# Patient Record
Sex: Male | Born: 1950 | Race: White | Hispanic: No | Marital: Married | Smoking: Never smoker
Health system: Southern US, Community
[De-identification: ages and names within clinical notes are randomized; demographics above are authoritative.]

## PROBLEM LIST (undated history)

## (undated) DIAGNOSIS — I10 Essential (primary) hypertension: Secondary | ICD-10-CM

## (undated) DIAGNOSIS — R7303 Prediabetes: Secondary | ICD-10-CM

## (undated) DIAGNOSIS — H5440 Blindness, one eye, unspecified eye: Secondary | ICD-10-CM

## (undated) DIAGNOSIS — G4733 Obstructive sleep apnea (adult) (pediatric): Secondary | ICD-10-CM

## (undated) DIAGNOSIS — K219 Gastro-esophageal reflux disease without esophagitis: Secondary | ICD-10-CM

## (undated) DIAGNOSIS — I639 Cerebral infarction, unspecified: Secondary | ICD-10-CM

## (undated) DIAGNOSIS — H9190 Unspecified hearing loss, unspecified ear: Secondary | ICD-10-CM

## (undated) DIAGNOSIS — E119 Type 2 diabetes mellitus without complications: Secondary | ICD-10-CM

## (undated) DIAGNOSIS — Z9989 Dependence on other enabling machines and devices: Secondary | ICD-10-CM

## (undated) DIAGNOSIS — F418 Other specified anxiety disorders: Secondary | ICD-10-CM

## (undated) DIAGNOSIS — F419 Anxiety disorder, unspecified: Secondary | ICD-10-CM

## (undated) HISTORY — PX: RECTAL SURGERY: SHX760

## (undated) HISTORY — DX: Prediabetes: R73.03

## (undated) HISTORY — DX: Obstructive sleep apnea (adult) (pediatric): G47.33

## (undated) HISTORY — DX: Gastro-esophageal reflux disease without esophagitis: K21.9

## (undated) HISTORY — DX: Blindness, one eye, unspecified eye: H54.40

## (undated) HISTORY — DX: Dependence on other enabling machines and devices: Z99.89

## (undated) HISTORY — DX: Cerebral infarction, unspecified: I63.9

## (undated) HISTORY — PX: TONSILLECTOMY: SUR1361

## (undated) HISTORY — DX: Other specified anxiety disorders: F41.8

## (undated) HISTORY — PX: UMBILICAL HERNIA REPAIR: SHX196

## (undated) HISTORY — DX: Essential (primary) hypertension: I10

## (undated) HISTORY — DX: Type 2 diabetes mellitus without complications: E11.9

---

## 2003-09-10 ENCOUNTER — Encounter: Admission: RE | Admit: 2003-09-10 | Discharge: 2003-09-10 | Payer: Self-pay | Admitting: Neurosurgery

## 2004-06-14 ENCOUNTER — Ambulatory Visit (HOSPITAL_COMMUNITY): Admission: RE | Admit: 2004-06-14 | Discharge: 2004-06-14 | Payer: Self-pay | Admitting: Neurology

## 2004-07-12 ENCOUNTER — Ambulatory Visit (HOSPITAL_COMMUNITY): Admission: RE | Admit: 2004-07-12 | Discharge: 2004-07-12 | Payer: Self-pay | Admitting: Internal Medicine

## 2004-07-29 ENCOUNTER — Observation Stay (HOSPITAL_COMMUNITY): Admission: EM | Admit: 2004-07-29 | Discharge: 2004-07-30 | Payer: Self-pay | Admitting: Emergency Medicine

## 2004-08-12 ENCOUNTER — Ambulatory Visit (HOSPITAL_COMMUNITY): Admission: RE | Admit: 2004-08-12 | Discharge: 2004-08-12 | Payer: Self-pay | Admitting: Internal Medicine

## 2006-09-21 ENCOUNTER — Encounter (INDEPENDENT_AMBULATORY_CARE_PROVIDER_SITE_OTHER): Payer: Self-pay | Admitting: *Deleted

## 2006-09-21 ENCOUNTER — Ambulatory Visit (HOSPITAL_COMMUNITY): Admission: RE | Admit: 2006-09-21 | Discharge: 2006-09-21 | Payer: Self-pay | Admitting: Urology

## 2008-01-01 ENCOUNTER — Ambulatory Visit: Payer: Self-pay | Admitting: Cardiology

## 2008-01-20 ENCOUNTER — Ambulatory Visit: Payer: Self-pay | Admitting: Cardiology

## 2008-01-23 ENCOUNTER — Inpatient Hospital Stay (HOSPITAL_COMMUNITY): Admission: AD | Admit: 2008-01-23 | Discharge: 2008-01-25 | Payer: Self-pay | Admitting: Cardiology

## 2008-01-23 ENCOUNTER — Ambulatory Visit: Payer: Self-pay | Admitting: Cardiology

## 2008-02-18 ENCOUNTER — Ambulatory Visit: Payer: Self-pay | Admitting: Cardiology

## 2008-07-24 ENCOUNTER — Ambulatory Visit: Payer: Self-pay | Admitting: Cardiology

## 2010-03-15 DIAGNOSIS — E119 Type 2 diabetes mellitus without complications: Secondary | ICD-10-CM | POA: Insufficient documentation

## 2010-03-15 DIAGNOSIS — K219 Gastro-esophageal reflux disease without esophagitis: Secondary | ICD-10-CM | POA: Insufficient documentation

## 2010-03-15 DIAGNOSIS — I10 Essential (primary) hypertension: Secondary | ICD-10-CM | POA: Insufficient documentation

## 2010-03-23 ENCOUNTER — Ambulatory Visit: Payer: Self-pay | Admitting: Cardiology

## 2010-03-23 DIAGNOSIS — R072 Precordial pain: Secondary | ICD-10-CM | POA: Insufficient documentation

## 2010-03-31 ENCOUNTER — Encounter: Payer: Self-pay | Admitting: Cardiology

## 2010-04-01 ENCOUNTER — Ambulatory Visit: Payer: Self-pay | Admitting: Cardiology

## 2010-04-01 ENCOUNTER — Encounter: Payer: Self-pay | Admitting: Cardiology

## 2010-04-05 ENCOUNTER — Encounter (INDEPENDENT_AMBULATORY_CARE_PROVIDER_SITE_OTHER): Payer: Self-pay | Admitting: *Deleted

## 2010-04-05 ENCOUNTER — Telehealth: Payer: Self-pay | Admitting: Cardiology

## 2010-04-06 ENCOUNTER — Telehealth (INDEPENDENT_AMBULATORY_CARE_PROVIDER_SITE_OTHER): Payer: Self-pay | Admitting: *Deleted

## 2010-11-22 NOTE — Progress Notes (Signed)
Summary: test results   Phone Note Call from Patient   Caller: Patient Reason for Call: Talk to Nurse, Lab or Test Results Summary of Call: please mail pt a letter when you get the results and he can call then. pt does not have a phome right now.  Initial call taken by: Edman Circle,  April 05, 2010 10:43 AM  Follow-up for Phone Call        Reviewed study with comments forwarded to RN. Follow-up by: Rollene Rotunda, MD, [facility],  April 05, 2010 1:28 PM

## 2010-11-22 NOTE — Medication Information (Signed)
Summary: Physician Orders  Physician Orders   Imported By: Roderic Ovens 04/04/2010 12:32:35  _____________________________________________________________________  External Attachment:    Type:   Image     Comment:   External Document

## 2010-11-22 NOTE — Progress Notes (Signed)
   Faxed all Cardiac over to [facility] practice of [place] @ fax [phone] cb Erle Crocker  April 06, 2010 9:40 AM

## 2010-11-22 NOTE — Letter (Signed)
Summary: Engineer, materials at [facility]  [address]   [place], [place] [address]   Phone: [phone]  Fax: [phone]        April 05, 2010 MRN: [id number]    [name] [name] [address] [place], [place]  [address]    Dear Mr. Keagle,  Your test ordered by Selena Batten has been reviewed by your physician (or physician assistant) and was found to be normal or stable. Your physician (or physician assistant) felt no changes were needed at this time.  ____ Echocardiogram  __X__ Cardiac Stress Test-Per Dr. Antoine Poche, no futher cardiac work up needed.  ____ Lab Work  ____ Peripheral vascular study of arms, legs or neck  ____ CT scan or X-ray  ____ Lung or Breathing test  ____ Other:   Thank you.   Cyril Loosen, RN, BSN    Duane Boston, M.D., F.A.C.C. Thressa Sheller, M.D., F.A.C.C. Oneal Grout, M.D., F.A.C.C. Cheree Ditto, M.D., F.A.C.C. Daiva Nakayama, M.D., F.A.C.C. Kenney Houseman, M.D., F.A.C.C. Jeanne Ivan, PA-C

## 2010-11-22 NOTE — Assessment & Plan Note (Signed)
Summary: [name] [name]  Medications Added SERTRALINE HCL 100 MG TABS (SERTRALINE HCL) 1 by mouth daily SIMVASTATIN 40 MG TABS (SIMVASTATIN) 1 by mouth dialy REMERON 15 MG TABS (MIRTAZAPINE) 1 by mouth daily      Allergies Added: NKDA   Visit Type:  Follow-up Primary Provider:  Dr. Wyvonnia Lora  CC:  chest pain.  History of Present Illness: The patient presents for evaluation of chest pain. He reports that this has been happening for about a month but in retrospect says it's really the same pain he's been having for a couple of years it just comes and goes. He did see Dr. Andee Lineman in the past and had a stress perfusion study with 2 slight perfusion defects. However, a followup catheterization demonstrated no coronary artery disease though he had sluggish flow down the LAD.  He now presents with recurrent chest discomfort similar to previous. He is substernal. It is a "hurting pain". It comes on with exertion lasting a couple of minutes after he stops what he is doing. He may also get discomfort with deep breathing at rest. He does get nauseated and sweats but this can happen without discomfort as well. He is not describe PND or orthopnea. He is not describing palpitations, presyncope or syncope. Is not describing PND or orthopnea. He does say that he gets an electrical feeling in his hands when he gets this pain.  Current Medications (verified): 1)  Sertraline Hcl 100 Mg Tabs (Sertraline Hcl) .Marland Kitchen.. 1 By Mouth Daily 2)  Simvastatin 40 Mg Tabs (Simvastatin) .Marland Kitchen.. 1 By Mouth Dialy 3)  Remeron 15 Mg Tabs (Mirtazapine) .Marland Kitchen.. 1 By Mouth Daily  Allergies (verified): No Known Drug Allergies  Past History:  Past Medical History:  1. Hypertension.  2. Borderline diabetes mellitus.  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  4. GERD  5. Sleep apnea (CPAP)  6. Depression/anxiety  Past Surgical History: Umbilical hernia repair. Tonsillectomy. Rectal surgery as an infant    Review of Systems       As stated in the HPI and negative for all other systems.   Vital Signs:  Patient profile:   60 year old male Height:      64 inches Weight:      221 pounds BMI:     38.07 Pulse rate:   65 / minute Resp:     16 per minute BP sitting:   150 / 96  (right arm)  Vitals Entered By: Marrion Coy, CNA (March 23, 2010 11:42 AM)  Physical Exam  General:  Well developed, well nourished, in no acute distress. Head:  normocephalic and atraumatic Eyes:  left eye and isocoric Mouth:  Dentures, otherwise unremarkable. Neck:  Neck supple, no JVD. No masses, thyromegaly or abnormal cervical nodes. Chest Wall:  no deformities or breast masses noted Lungs:  Clear bilaterally to auscultation and percussion. Abdomen:  Bowel sounds positive; abdomen soft and non-tender without masses, organomegaly, or hernias noted. No hepatosplenomegaly, obese Msk:  Back normal, normal gait. Muscle strength and tone normal. Extremities:  No clubbing or cyanosis. Neurologic:  Alert and oriented x 3. Skin:  Intact without lesions or rashes. Cervical Nodes:  no significant adenopathy Inguinal Nodes:  no significant adenopathy Psych:  Normal affect.   Detailed Cardiovascular Exam  Neck    Carotids: Carotids full and equal bilaterally without bruits.      Neck Veins: Normal, no JVD.    Heart    Inspection: no deformities or lifts  noted.      Palpation: normal PMI with no thrills palpable.      Auscultation: regular rate and rhythm, S1, S2 without murmurs, rubs, gallops, or clicks.    Vascular    Abdominal Aorta: no palpable masses, pulsations, or audible bruits.      Femoral Pulses: normal femoral pulses bilaterally.      Pedal Pulses: normal pedal pulses bilaterally.      Radial Pulses: normal radial pulses bilaterally.      Peripheral Circulation: no clubbing, cyanosis, or edema noted with normal capillary refill.     EKG  Procedure date:  03/23/2010  Findings:      Sinus  rhythm, rate 66, axis within normal limits, intervals within normal limits, no acute ST-T wave changes.  Impression & Recommendations:  Problem # 1:  PRECORDIAL PAIN (ICD-786.51)  His chest discomfort has some typical and some atypical features. I would like to do a treadmill stress test but he doesn't think he could ambulate. Therefore, he will have an adenosine Cardiolite. We will do this at [facility] so that we can compare it to previous. He did have small defects on the previous study. We will be looking for obvious new ischemic defects.  Orders: EKG w/ Interpretation (93000)  Problem # 2:  HYPERTENSION (ICD-401.9)  Blood pressure is slightly elevated here. However, he says he has a good blood pressure, and he takes at home it runs in the 130/70 range. Therefore, I will make no change to his medications at this time.  Orders: EKG w/ Interpretation (93000)  Problem # 3:  DIABETES MELLITUS, BORDERLINE (ICD-790.29) He reports that this is borderline.  I have no records from his primary care but will defer to his management.  Patient Instructions: 1)  Your physician recommends that you schedule a follow-up appointment as needed 2)  Your physician recommends that you continue on your current medications as directed. Please refer to the Current Medication list given to you today. 3)  Your physician has requested that you have an adenosine myoview.  For further information please visit [email or web address]  Please follow instruction sheet, as given.

## 2010-11-22 NOTE — Miscellaneous (Signed)
Summary: Orders Update  Clinical Lists Changes  Orders: Added new Referral order of Nuclear Med (Nuc Med) - Signed 

## 2011-03-07 NOTE — Cardiovascular Report (Signed)
NAME:  CAMILLE, THAU               ACCOUNT NO.:  [id number]   MEDICAL RECORD NO.:  [id number]          PATIENT TYPE:  INP   LOCATION:  3703                         FACILITY:  [facility]   PHYSICIAN:  Everardo Beals. Juanda Chance, MD, FACCDATE OF BIRTH:  09-21-1951   DATE OF PROCEDURE:  01/24/2008  DATE OF DISCHARGE:                            CARDIAC CATHETERIZATION   CLINICAL HISTORY:  Mr. Youngblood is 60 years old and was recently admitted  to [facility] with syncope.  He was discharged and had an  outpatient Myoview scan which suggested myocardial ischemia.  He was  brought back into the hospital with recurrent chest pain and transferred  for evaluation with angiography.   PROCEDURE:  Right heart catheterization was performed percutaneously via  the right femoral vein using a minisheath and Swan-Ganz thermodilution  catheter.  Left heart catheterization was performed percutaneously via  the right femoral artery using an arterial sheath and 5-French preformed  coronary catheters.  A front wall arterial puncture was performed, and  Omnipaque contrast was used.  The patient tolerated the procedure well  and left the laboratory in satisfactory condition.   RESULTS:  Left main coronary artery.  The left main coronary artery was  free of significant disease.   Left anterior descending artery.  The left anterior descending artery  gave rise to three diagonal branches and three septal perforators.  These vessels were free of significant disease, but the flow was slow in  the vessel, requiring 45 cycles for complete filling (TIMI II flow).   The circumflex artery.  The circumflex artery is a small vessel that  supplied only a posterolateral branch.  This vessel is free of  significant disease.   The right coronary artery.  The right coronary artery was a moderately  large vessel that gave rise to conus branch, and right ventricular  branch, a posterior descending branch, and two posterolateral  branches.  This vessel also had slow flow with four cycles to feel the completes  vessel (TIMI II flow).   The left ventriculogram.  The left ventriculogram performed in the RAO  projection showed good wall motion, with no areas of hypokinesis.  The  estimated fraction was 55%.  There was no mitral regurgitation.   HEMODYNAMIC DATA:  The right ventricular pressure was 14 mean.  The  pulmonary artery pressure was 45/30, with mean of 36.  Pulmonary wedge  pressure was 20 mean.  Left ventricular pressure was 106/26.  The LA  pressure 106/74, with mean of 91.  Cardiac output/cardiac index was  5/2.5 liters/minutes/sq m by Fick.  The pulmonary artery saturation was  67%, and the aortic saturation was 92%.   CONCLUSION:  1. Normal coronary angiography, but with TIMI II flow in the left      anterior descending and right coronary arteries, and normal left      ventricular systolic function, with an estimated ejection fraction      of 55%.  2. Elevated pulmonary wedge and pulmonary artery pressure, with      elevated pulmonary vascular resistance.   RECOMMENDATIONS:  The patient appears to  have congestive heart,  primarily related to diastolic dysfunction, with also associated  elevation in her pulmonary vascular resistance.  We will plan medical  therapy with gentle diuresis and target discharge tomorrow.      Bruce Elvera Lennox Juanda Chance, MD, [facility]  Electronically Signed     BRB/MEDQ  D:  01/24/2008  T:  01/24/2008  Job:  [id number]   cc:   Ellyn Hack, MD,FACC  Cardiopulmonary Lab

## 2011-03-07 NOTE — Assessment & Plan Note (Signed)
[facility]                          [facility] NOTE   NAME:[name] [name] SCHIFF                      MRN:          [id number]  DATE:01/20/2008                            DOB:          November 28, 1950    REFERRING PHYSICIAN:  Wyvonnia [name]   HISTORY OF PRESENT ILLNESS:  Patient 60 year old male with history of  syncope.  The patient was admitted to [facility] on December 31, 2007.  He  was admitted to the service of Dr. Doyne Keel.  The patient reportedly was  at his doctor's office when he suddenly felt very lightheaded and dizzy.  Sat down in a chair, but he thinks he blacked out for a period of  seconds to 1 minute.  When he woke up his fingers and feet were tingling  but had no other symptoms.  Was admitted by Dr. Doyne Keel and ruled out  for myocardial infarction.  I do not have orthostatic blood pressures  available from this hospital visit.  The Cardiolite stress study,  however was ordered as an outpatient due to a history of substernal  chest pain obtained on admission.  A Cardiolite study was markedly  positive with 2 small defects which were reversible an ejection fraction  of 55%.   I asked the patient after reviewed his stress test to come to the office  for initial office visit.  The patient reports to me that he has  exertional chest pain and shortness of breath, He also states it has  been gradually getting worse over the last several months.  He has  symptoms of orthostasis.  He states when he gets upper rapidly or bends  forward or stands up, he feels he is extremely dizzy and at times passes  out.  Actually had an episode of syncope approximately a week ago.   The patient lives with his wife at [employer].  He  has difficulty with his transport and uses the [employer] to get  to various places.  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  He required  reconstructive  surgery.   The patient reports to me that he has dyspnea on minimal exertion.  This  has been a bothersome symptom lately.  EKG in the office has been  normal.   ALLERGIES:  No known drug allergies.   MEDICATIONS:  Dilantin 5 mg p.o. b.i.d. Lisinopril 40 mg p.o. daily.  Hydrochlorothiazide 25 mg p.o. daily.  Aspirin 81 mg p.o. daily.   PAST SURGICAL HISTORY:  Umbilical hernia repair and rectal  reconstructive surgery.  History of hypertension.   SOCIAL HISTORY:  The patient is married.  Has no children.  Does not  smoke.  Rarely uses alcohol.  Has been on disability all his life due to  significant decrease in vision and his bowel problems.  He says he tries  to walk on a regular basis.  Negative for premature coronary arteries,  history of colon cancer.  Some of his family members have had cancer.  Mostly has  been lung cancer as a half-sister with sudden death at age  46.   REVIEW OF SYSTEMS:  As per HPI.  The patient states that his weight is  gradually going up and he reports lower extremity edema, chronic  decreased vision to the point that he is unable to drive.  He reports  chest pain and palpitations, shortness of breath on minimal exertion.  No abdominal pain, incontinence.  Ophthalm as outlined above.  GU:  Negative.  Neuro:  Chronic headaches of focal neurological problems.   PHYSICAL EXAMINATION:  VITAL SIGNS:  Blood pressure is 127/80, heart  rate is 82 beats per minute, weight is 228 pounds.  Neck exam normal carotid stroke no carotid bruits.  LUNGS:  Clear breath sounds bilaterally.  HEART:  Regular rate and rhythm.  Normal S1, S2.  No murmurs, gallops.  ABDOMEN:  Nontender no rebound or guarding.  Good bowel sounds.  Extremity exam no cyanosis, clubbing.  Lower extremity edema reveals 2+  peripheral pitting edema.   PROBLEM LIST:  1. History of syncope.  Rule out orthostasis.  2. Hypertension.  3. Decrease in vision.  4. Bowel incontinence.   5. Abnormal Cardiolite stress study with chest pain and dyspnea.   PLAN:  1. The patient's chest pain and dyspnea very concerning for angina in      light of his Cardiolite study.  We will proceed with left to right      heart catheterization.  This will be scheduled as an inpatient due      to the patient's difficulty with transportation.  2. The patient has ischemic heart disease, and will check a      orthostatic blood pressures in the office today.  I cut his      lisinopril and half and stopped his hydrochlorothiazide.  3. Discussed risks and benefits of cardiac catheterization, the      patient is willing to proceed.  If catheterization is within normal      limits further evaluation CardioNet may still been ordered to      further evaluate his palpitations.     Learta Codding, MD,FACC  Electronically Signed    GED/MedQ  DD: 01/20/2008  DT: 01/20/2008  Job #: [id number]   cc:   [name] [name]

## 2011-03-07 NOTE — Assessment & Plan Note (Signed)
[facility]                          [facility] NOTE   NAME:[name] [name], [name] [name]                      MRN:          [id number]  DATE:02/18/2008                            DOB:          [birth year]    PRIMARY CARE PHYSICIAN:  Wyvonnia Lora, M.D.   The patient is a 60 year old male with a history of syncope.  The  patient has been extensively evaluated with cardiac monitor; as of yet  today there have been no significant arrhythmias or no significant  pauses.  He also underwent a cardiac catheterization, which showed slow  flow in the epicardial vessels; but no significant epicardial lesions  were noted.  The patient also had normal LV function, with elevated left  ventricular filling pressures.  The patient was scheduled for routine  visit at 2:15 this afternoon; however, due to difficulty with a ride he  decided to come this morning.  When walking to the Clinic he was  initially fine, and as outlined above this was a routine visit with no  real complaints.  However, as the patient was getting anxious sitting  and waiting for me to come and see him at 10 o'clock this morning, he  became anxious and developed chest tightness and shortness of breath.  His EKG showed, however, no acute ischemic changes.  Saturation also was  normal at 94%. The  patient was anxious.  They said that he also started  shaking.  We gave him some water and the patient felt much better.  He  also received 2 nitroglycerin , but this did not improve his symptoms.  Again, his EKG was carefully reviewed and there were no  electrocardiographic changes.  Vital signs were perfectly stable.  Of  note, is that he was also previously ruled out with D-dimer level, and  this was given a low likelihood of PE.   PHYSICAL EXAMINATION:  VITAL SIGNS:  Blood pressure 136/88, heart rate  88 beats per minute, saturations 96% on exertion and 96% at rest.  HEENT:  Pupils without scleral  icterus.  Oral mucosa clear.  NECK:  Supple.  Normal carotid upstroke; no carotid bruits.  LUNGS:  Clear breath sounds bilaterally.  HEART:  Regular rate and rhythm.  Normal sinus.  S1 and S2.  No murmurs,  gallops.  ABDOMEN:  Soft, nontender.  No rebound or guarding.  Good bowel sounds.  EXTREMITIES:  No cyanosis, clubbing or edema.   PROBLEM LIST:  1. Syncope.  Rule out orthostasis.  2. Hypertension.  3. Decrease in vision.  4. Bowel incontinence.  5. Abnormal Cardiolite stress study, but normal cardiac      catheterization with no evidence of flow-limiting disease -- based      on epicardial disease.   PLAN:  1. The patient appears to be anxious in the office.  He initially came      in without any symptoms.  There is no objective evidence of      ischemia.  His saturations were normal at rest and on exertion.  2. I will continue to have the patient wear a  cardiac monitor.  No      abnormalities have been noted so far.  3. I will refer the patient for an ABG and lower extremity venous      Dopplers, to make sure he does not have deep venous thrombosis or      risk for thromboembolic events.     Learta Codding, MD,FACC  Electronically Signed    GED/MedQ  DD: 02/18/2008  DT: 02/18/2008  Job #: [id number]   cc:   Wyvonnia Lora

## 2011-03-07 NOTE — Assessment & Plan Note (Signed)
[facility]                          [facility] NOTE   NAME:[name] [name], [name] [name]                      MRN:          [id number]  DATE:07/24/2008                            DOB:          1950-11-08    HISTORY OF PRESENT ILLNESS:  The patient is a 60 year old male with a  history of palpitations and chest pain.  The patient underwent a cardiac  catheterization, has no significant coronary artery disease.  He does  still get chest pains off and on, but the patient is very anxious as  going through a major depression and has lot of somatization of his  complaints.  He also states that up until recently he was doing  significant amount of moonshine, but he has laid off of alcohol.  He  also recognizes that he has severe psychiatric condition and is now for  almost a year under treatment with citalopram, risperidone, and  sertraline.  He reports, however, no palpitations from a cardiac  standpoint.  He appears to be otherwise stable.   MEDICATIONS:  1. Valium 5 mg p.o. b.i.d.  2. Aspirin 81 mg p.o. daily.  3. CPAP nightly.  4. Potassium 10 mEq p.o. daily.  5. Lisinopril 20 mg p.o. daily.  6. Citalopram 20 mg p.o. daily.  7. Risperidone 2 mg p.o. daily.  8. Sertraline 100 mg p.o. daily.  9. Hydrochlorothiazide 25 mg daily.   PHYSICAL EXAMINATION:  VITAL SIGNS:  Blood pressure 140/90, heart rate  66, weight 222 pounds.  NECK:  Normal carotid upstroke.  No carotid bruits.  LUNGS:  Clear breath sounds bilaterally.  HEART:  Regular rate and rhythm.  Normal S1 and S2.  No murmur, rubs, or  gallops.  ABDOMEN:  Soft and nontender.  No rebound or guarding.  Good bowel  sounds.  EXTREMITIES:  No cyanosis, clubbing, or edema.   PROBLEM LIST:  1. History of syncope, resolved.  2. Hypertension.  3. Atypical chest pain.  4. Abnormal Cardiolite stress study, but normal cardiac      catheterization with no evidence of flow-limiting disease based on   epicardial disease.   PLAN:  1. The patient clearly is anxious and somatizes his chest pain when he      is depressed and anxious.  I have told him that his chest pain is      real, but that he really has no significant coronary artery      disease, but cannot take nitroglycerin to abort the episodes.  2. I spent significant amount of time to talk with the patient about      his psychiatric illness and      how this relates to somatization of his cardiac complaints.  He was      more reassured and felt more comfortable, and he will use      nitroglycerin on a p.r.n. basis.     Learta Codding, MD,FACC  Electronically Signed    GED/MedQ  DD: 07/24/2008  DT: 07/25/2008  Job #: [id number]

## 2011-03-07 NOTE — Discharge Summary (Signed)
NAME:  [name] [name], [name] [name] NO.:  [id number]   MEDICAL RECORD NO.:  [id number]          PATIENT TYPE:  INP   LOCATION:  3703                         FACILITY:  [facility]   PHYSICIAN:  Duke Salvia, MD, FACCDATE OF BIRTH:  [birth year]   DATE OF ADMISSION:  01/23/2008  DATE OF DISCHARGE:  01/25/2008                         DISCHARGE SUMMARY - REFERRING   DISCHARGE DIAGNOSES:  1. Noncardiac chest discomfort.  2. Congestive heart failure with diastolic dysfunction.  3. Syncope.  4. Hypokalemia.  5. Obstructive sleep apnea.  History as noted below.   PROCEDURES PERFORMED:  Right and left cardiac catheterization on January 24, 2008, by Dr. Charlies Constable.   SUMMARY OF HISTORY:  [name] [name] is a 60 year old male who was admitted  to [facility] on March 10 after being seen by his primary care physician  when he suddenly felt lightheaded, dizzy with possible syncope.  He  ruled out for myocardial infarction and a stress test was ordered  because of a history of chest discomfort.  Imaging was positive with EF  of 55%.  Dr. Andee Lineman reviewed in the office and given his history of  exertional chest discomfort, shortness of breath with abnormal stress  test it was felt that he should undergo cardiac catheterization.   PAST MEDICAL HISTORY:  Is notable for hypertension, rectal  reconstructive surgery and umbilical hernia.   LABORATORY:  At [facility] admission weight was 101 kg.  H and  H was 15.5 and 44.1, normal indices, platelets 219, WBCs 8.4.  At the  time of discharge H and H was 15.2 and 44.5, normal indices, platelets  210, WBCs 9.0.  PTT 29, PT 12.4.  Admission sodium 139, potassium 4.0.  BUN 21, creatinine 1.28, glucose 127.  LFTs were unremarkable except for  a total bilirubin of 2.0 and an AST of 41.  At the time of discharge  sodium was 137, potassium 3.3, BUN 23, creatinine 1.27, glucose 107.  Fasting lipids showed a total cholesterol 141, triglycerides 195, HDL  20  and LDL 82.  TSH was 2.095.   Chest x-ray on April 2 revealed cardiomegaly without edema or pneumonia.  EKG showed normal sinus rhythm, normal axis, nonspecific changes.   HOSPITAL COURSE:  After being seen in the office the patient was  admitted for cardiac catheterization.  This was to be a right and left  cardiac catheterization.  He was scheduled as an inpatient due to the  patient's transportation problems.  Dr. Andee Lineman had discontinued his HCTZ  and cut his lisinopril in half.   Cardiac catheterization was performed on January 24, 2008, by Dr. Juanda Chance.  This did not show any coronary artery disease with normal LV function.  Dr. Juanda Chance did note his PAW and PAP were elevated compatible with  diastolic dysfunction.  He remained overnight with gentle diuresis.  By  the 4th Dr. Graciela Husbands felt that the patient could be discharged home  although prior to discharge his potassium was supplemented.   DISPOSITION:  The patient is discharged home.  Asked to maintain low-  sodium heart-healthy diet.  Wound care and  activities are per  supplemental discharge sheet post catheterization.  He was advised to  weigh daily and to bring all weights and all medications to all  appointments.  He received a new prescription for Lasix 40 mg daily and  potassium 20 mEq daily.  He was advised to continue lisinopril 20 mg  daily, diazepam 5 mg b.i.d., sertraline 100 mg and risperidone 2 mg as  previously taken, citalopram 20 mg daily and aspirin 81 mg daily.  It is  noted there are some discrepancies on his medications at the hospital  and he was asked to bring all medications to all followup appointments.  He was advised not to take HCTZ.  Dr. Margarita Mail office will call him with  arrangements for followup appointments.  Discharge time 30 minutes.      Joellyn Rued, PA-C      Duke Salvia, MD, [facility]  Electronically Signed    EW/MEDQ  D:  01/25/2008  T:  01/25/2008  Job:  [id number]   cc:   Aram Candela, MD,FACC

## 2011-03-10 NOTE — Procedures (Signed)
NAME:  [name] [name], [name] [name]               ACCOUNT NO.:  [id number]   MEDICAL RECORD NO.:  [id number]           PATIENT TYPE:   LOCATION:                                 FACILITY:   PHYSICIAN:  Kofi A. Gerilyn Pilgrim, M.D.      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   ANALYSIS:  A 16-channel recording is conducted for approximately 33 minutes.  There is low electrocortical voltage activity noted with a maximum frequency  of 10 Hz in the posterior areas.  The activity is attenuated with eye  opening.  There is beta activity seen in the frontal areas.  Awake and  drowsy activities are noted.  There is a brief episode of spindles and K  complexes noted.  Photic stimulation does not elicit any abnormal responses,  there is no focal slowing, lateralized slowing or epileptiform activity  noted.   IMPRESSION:  This is a normal recording of awake and drowsy states.     Kofi   KAD/MEDQ  D:  08/17/2004  T:  08/17/2004  Job:  [id number]

## 2011-03-10 NOTE — H&P (Signed)
NAME:  [name] [name], [name] [name] NO.:  [id number]   MEDICAL RECORD NO.:  [id number]          PATIENT TYPE:  INP   LOCATION:  A228                          FACILITY:  [facility]   PHYSICIAN:  Vania Rea, M.D. DATE OF BIRTH:  12/26/1950   DATE OF ADMISSION:  07/29/2004  DATE OF DISCHARGE:  LH                                HISTORY & PHYSICAL   PRIMARY CARE PHYSICIAN:  Dr. Margo Common in [place].   DICTATING PHYSICIAN:  Dr. Vania Rea.   CHIEF COMPLAINT:  Syncope after blood draw this morning.   HISTORY OF PRESENT ILLNESS:  This is a 60 year old Caucasian man with  history of hypertension, anxiety, h/o recent upper GI bleed, who has a  history of syncope at the sight of blood, who was in a laboratory having  blood drawn at the request of his gastroenterologist.  The patient took care  not to look in the direction of the needle when the blood was being drawn  and was advised to rest for 20 minutes after the blood draw.  After resting  for about 20-30 minutes, the patient decided to get up from the couch, when  he got up he said his visual fields suddenly became various colors and that  is the last thing he remembered.  He woke up in the emergency room with a  nurse talking to him, but since then his memory after the syncope has been  impaired.  He has difficulty remembering the events occurring after the  faint.  I interviewed him initially in the emergency room and his history  was confused and associated memory blocks.  Currently he does not remember  any of the conversation we had in the emergency room, but he feels as if his  memory is coming back to normal, but is not quite there.  Says he was  evaluated by a cardiologist about a year ago in [place], but cannot  remember their name or exactly where they were situated.  He is currently  having a headache, but prior to the faint was not having a headache.  After  waking up he also experienced chest pain and shortness of  breath and  dizziness.  The pain at times, he says feels as if somebody is squeezing his  ribs, at other times feels as if he is being stung by bees.  He has no  nausea or vomiting..   Baseline:he gets short of breath after walking one block.  He has to stop in  the middle of a flight of stairs because of dyspnea.  He says that his  exercise program involves pushups.   PAST MEDICAL HISTORY:  1.  Hypertension.  2.  Anxiety disorder.  3.  GERD.  4.  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  5.  Chronic double vision right eye.   MEDICATIONS:  1.  HCTZ 25 mg daily.  2.  Lisinopril 40 mg daily.  3.  Nexium 40 mg daily.  4.  Valium 5 mg twice daily.   ALLERGIES:  NO KNOWN DRUG ALLERGIES.   SOCIAL HISTORY:  Lives with  his wife of 17 years, he is disabled, a former  Chemical engineer at [employer].  He denies any history of tobacco or alcohol  or illicit drug use.  His disability is related to his blindness.   FAMILY HISTORY:  Significant for a mother who lived to age 36 and a father  who lived to be 100, without any significant problem.  He has four sisters  and one brother all of whom are healthy.  His mother did have diabetes.   REVIEW OF SYSTEMS:  Currently having headache, no fever, cough or cold.  CARDIOVASCULAR:  As in the H&P.  GI:  Is currently getting his GI workup for  an episode of black vomitus in August.  No recent weight changes, no nausea,  vomiting, diarrhea or constipation.  No genitourinary problems.  No joint  problems, but says that he has a problem with his left leg, very often feels  cold.  No endocrine problems.   PHYSICAL EXAMINATION:  This is a well-built Caucasian man lying in bed, very  anxious, initially was very confused, now seems more together.  VITALS:  Temperature 97.2, pulse 75, blood pressure 115/63, respiratory rate  19.  The pain was at one time 10/10, he says it is now a 5/10 discomfort.  HEENT:  Left pupils is nonreactive, his right eye reacts to  light.  There is  no thyromegaly, no jugular venous distention.  CHEST: clear to auscultation bilaterally.  CARDIOVASCULAR:  He has a regular rhythm without murmur.  ABDOMEN:  Obese, soft and nontender.  EXTREMITIES:  Very muscular.  There is trace edema bilaterally.   LABS:  His labs drawn this morning shows a cbc which is entirely normal.  White count is 16 and hematocrit 46.7, and normal differential.  His alcohol  was unmeasurable.  His INR was 0.9, his serum chemistry apart from a glucose  of 118, was entirely normal.  His creatinine was 1.3, calcium 9.2, albumin  3.9, his liver functions were likewise entirely normal.  His first set of  point of care enzymes likewise entirely normal.  His urinalysis is  completely bland.  His urine drug screen was positive for benzodiazepines,  but otherwise no illicit substances.  A CT of his head shows no acute  finding.  Chest x-ray shows bilateral basilar atelectasis, left greater than  right, some blunting of the left costophrenic angle.   ASSESSMENT:  1.  Acute syncopal episode with anterograde amnesia.  2.  Seizure disorder is a possibility with this gentleman because of the      anterograde amnesia, however there is a possibility that it is a      vasovagal syncope.  His cardiac status is unknown at this time.  He is      having chest pain, he does have baseline dyspnea on exertion.  Although      his chest pain is reproducible, we will give him the benefit of a      cardiac workup including a 2D echocardiogram and a cardiology consult.  3.  In view of his abnormal chest x-ray findings, and his reproducible chest      pain, we will get a D-Dimer to rule out pulmonary embolus.  If we cannot      rule it out with a D-Dimer, I think it would be wise to get a CT scan of      his chest.     Leop   LC/MEDQ  D:  07/29/2004  T:  07/29/2004  Job:  [id number]

## 2011-03-10 NOTE — H&P (Signed)
NAME:  [name] [name], [name] [name]               ACCOUNT NO.:  [id number]   MEDICAL RECORD NO.:  [id number]          PATIENT TYPE:  AMB   LOCATION:                                FACILITY:  [facility]   PHYSICIAN:  Dennie Maizes, M.D.   DATE OF BIRTH:  [birth year]   DATE OF ADMISSION:  09/21/2006  DATE OF DISCHARGE:  LH                              HISTORY & PHYSICAL   CHIEF COMPLAINT:  Recurrent inflammation of the foreskin, urinary  urgency, mild urinary incontinence.   HISTORY OF PRESENT ILLNESS:  This 60 year old male was referred to me by  Dr. Wyvonnia Lora for evaluation and management of urinary symptoms.  Complains of having recurrence inflammation of the foreskin.  Sexual  intercourse is painful, and he has noted tearing of the foreskin after  sexual intercourse.  He also complains of urinary urgency and occasional  urge incontinence.  He has good urinary flow, urinary frequency x3, and  nocturia x3.  There is no history of fever, chills, flank pain, or gross  hematuria.   PAST MEDICAL HISTORY:  1. Hypertension.  2. Borderline diabetes mellitus.  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  4. GERD  5. Status post umbilical hernia repair.  6. Status post tonsillectomy.   MEDICATIONS:  1. Prilosec 20 mg 1 p.o. daily.  2. Hydrochlorothiazide 50 mg p.o. daily.  3. Lisinopril 40 mg 1 p.o. daily.  4. Omeprazole 20 mg 1 p.o. daily.  5. Lorazepam 10 mg 1 p.o. b.i.d.   ALLERGIES:  None.   FAMILY HISTORY:  Positive for coronary artery disease, MI, diabetes, and  Alzheimer's dementia.   PHYSICAL EXAMINATION:  VITAL SIGNS: Height 5 feet 1 inch, weight 233  pounds.  HEAD, EYES, EARS, NOSE, AND THROAT:  Normal.  The patient is blind in  the left eye.  NECK: No masses.  LUNGS: Clear to auscultation.  HEART: Regular rate and rhythm, no murmurs.  ABDOMEN: Soft, no palpable or frank mass.  No Costovertebral angle  tenderness. Bladder is not palpable.  GU:  Penis is normal.  Testes are  normal.  The patient has thickening  and inflammation of the foreskin consistent with recurrent balanitis,  bilateral epididymal nodularity.  RECTAL:  3-4 g benign prostate.   IMPRESSION:  Recurrent balanitis, phimosis.   PLAN:  I have discussed with the patient regarding management options  for recurrent balanitis. He is scheduled to undergo circumcision under  anesthesia in short-stay center.  This has been scheduled to be done on  September 21, 2006.  I have discussed with the patient regarding the  diagnosis, operative details, alternative treatment, outcome, possible  risks and complications, and he has agreed for the surgery to be done.      Dennie Maizes, M.D.  Electronically Signed     SK/MEDQ  D:  09/20/2006  T:  09/20/2006  Job:  [id number]

## 2011-03-10 NOTE — Consult Note (Signed)
NAME:  [name] [name], [name] [name]               ACCOUNT NO.:  [id number]   MEDICAL RECORD NO.:  [id number]          PATIENT TYPE:  INP   LOCATION:  A228                          FACILITY:  [facility]   PHYSICIAN:  Kofi A. Gerilyn Pilgrim, M.D. DATE OF BIRTH:  15-May-1951   DATE OF CONSULTATION:  DATE OF DISCHARGE:                                   CONSULTATION   CONSULTING PHYSICIAN:  Kofi A. Gerilyn Pilgrim, M.D.   REASON FOR CONSULTATION:  Passing out spells while getting blood drawn.   IMPRESSION:  The patient's spell seems most consistent with a vagovagal  syncope.  Certainly the prolonged amnesia suspicious for a seizure event.  It is unlikely that cerebrovascular event is the etiology.   RECOMMENDATIONS:  Electroencephalography.   This is a 60 year old Caucasian man who was actually known to our service in  the outpatient setting for a workup of diplopia.  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  The patient apparently was sent for a blood test  yesterday.  He apparently passed out some time after or during the blood  draw.  The patient can remember getting up and feeling a whooshing sound  in his head.  He then developed nausea and then apparently passed out.  The  patient does not remember anything until he woke up in the emergency room.  No focal neurological symptoms are reported.  The patient does report having  urine incontinence, although no oral trauma is reported.  The patient  reports having some postictal shortness of breath, chest pain, and  dizziness.   PAST MEDICAL HISTORY:  1.  Hypertension.  2.  Chronic monocular diplopia.  3.  Congenital blindness of the left eye.  4.  GERD.  5.  Anxiety disorder.  6.  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  He has been evaluated by myself and also      by a neurosurgeon.   ADMISSION MEDICATIONS:   Hydrochlorothiazide, Lisinopril, Nexium, Valium.   ALLERGIES:  None known.   SOCIAL HISTORY:  He lives with his wife of 15 years.  He is disabled.  A  former Financial controller at [employer].  No reports of alcohol, tobacco, or illicit  drug use.   FAMILY HISTORY:  Significant for really no significant problems, other than  his mother who apparently had diabetes.   REVIEW OF SYSTEMS:  As stated in the history of present illness.  He does  report having a headache after the event and continues to have one now.   PHYSICAL EXAMINATION:  GENERAL:  Shows a pleasant, mildly overweight  gentleman, no acute distress.  VITAL SIGNS:  Temperature is 98.1, pulse 65, respirations 18, blood pressure  143/84.  NECK:  Supple.  LUNGS:  Clear to auscultation bilaterally.  CARDIOVASCULAR:  Normal S1 S2.  ABDOMEN:  Obese but soft.  EXTREMITIES:  No significant varicosities or edema.  NEUROLOGIC:  The patient is awake, alert.  He converses well.  No dysarthria  is noted.  No language impairment is noted.  Cognition is unremarkable.  Cranial nerve evaluation:  He has left pupil that is irregularly shaped and  sluggishly reactive.  I believe this is his baseline.  He also has a mild  ptosis on the left, again his baseline.  Extraocular movements are full.  No  nystagmus is noted.  The right pupils is 5-mm and briskly reactive.  Visual  fields are full involving the right eye.  Face and muscle strength shows  normal and symmetric strength including the eye muscles/orbicularis oculi.  Tongue is midline.  Uvula is midline.  Shoulder shrug is normal.  Motor  examination shows normal tone, bulk, and strength in the upper extremities.  Lower extremities show slightly increased tone with normal strength.  Reflexes are brisk in the lower extremities and slightly brisk in the upper  extremities.  Toes are upgoing on the right and downgoing on the left.  Sense examination normal to temperature and light touch.   Coordination is  unremarkable.   SUPPORTIVE DATA:  WBC 6, hemoglobin 16.2, platelet count 220.  Differential  is normal.  INR is 0.9.  Chemistry is unremarkable.  Liver enzymes normal.  CPK 97.  The urine drug screen is positive for metabolites of  benzodiazepines.  He is on Valium.  Alcohol level undetected.  Urinalysis  negative.   Thanks for this consultation.      KAD/MEDQ  D:  07/29/2004  T:  07/29/2004  Job:  [id number]

## 2011-03-10 NOTE — Consult Note (Signed)
NAME:  AZIR, MUZYKA                           ACCOUNT NO.:  [id number]   MEDICAL RECORD NO.:  [id number]                   PATIENT TYPE:   LOCATION:                                       FACILITY:   PHYSICIAN:  R. Roetta Sessions, M.D.              DATE OF BIRTH:   DATE OF CONSULTATION:  DATE OF DISCHARGE:                                   CONSULTATION   REFERRING PHYSICIAN:  Wyvonnia Lora, M.D.   REASON FOR CONSULTATION:  Hematemesis and refractory GERD.   HISTORY OF PRESENT ILLNESS:  Mr. Bedrosian is a 60 year old Caucasian male who  presents to our office with a history of regurgitation, water brash, and  dyspepsia.  He notes his symptoms are worse nocturnally.  He also reports  episodes of yellowish emesis with black specks that he states were  Gastroccult-positive in the ER several years ago when he underwent EGD by  Dr. Gabriel Cirri.  He reportedly had a very similar episode several weeks ago.  He was started on Nexium 40 mg daily.  This has given a reported 50% relief  on PPI therapy.  He denies any aspirin, NSAID, or Goody Powder use.  He  denies any heartburn.  He does report solid food dysphagia where he feels  the food hangs just above the suprasternal notch.  He also complains fo  intermittent odynophagia, which he notes feels knife-like.  He is also  complaining of abdominal bloating as well.   Bowel movements have been once daily without any melena or rectal bleeding.  He denies any constipation or diarrhea.   PAST MEDICAL HISTORY:  1.  Hypertension.  2.  Last colonoscopy reportedly normal in 1995, although I do not have      details on this.  3.  Anxiety.   PAST SURGICAL HISTORY:  1.  Umbilical hernia repair x2.  2.  Tonsillectomy.   CURRENT MEDICATIONS:  1.  Hydrochlorothiazide 25 mg daily.  2.  Nexium 40 mg daily.  3.  Lisinopril 40 mg daily.  4.  Valium 5 mg b.i.d.   ALLERGIES:  No known drug allergies.   FAMILY HISTORY:  No first degree of colorectal  carcinoma, although he does  have a maternal aunt and a paternal aunt reportedly with colon carcinoma  diagnosed at a very late ate.  Mother deceased at age 18 secondary to  diabetes mellitus.  Father deceased at age 40 with no significant history.  He has four sisters and one brother, all of whom are healthy.   SOCIAL HISTORY:  Mr. Sprinkle has been married for 17 years.  He is currently  disabled.  He denies any tobacco, alcohol, or drug use.   REVIEW OF SYSTEMS:  CONSTITUTIONAL:  He is complaining of some fatigue.  He  notes his weight fluctuates five or 10 pounds either way, but no significant  loss or gain.  Appetite is good.  CARDIOVASCULAR:  Denies any palpitations.  He does report a history of substernal chest pain several weeks ago, which  is followed by Dr. Jackolyn Confer office. He reportedly had normal EKG and stress  test.  PULMONARY:  He denies any shortness of breath, dyspnea, cough, or  hemoptysis.  HEMATOLOGIC:  He denies any history of anemia or blood  dyscrasias.  GASTROINTESTINAL:  See HPI.   PHYSICAL EXAMINATION:  VITAL SIGNS:  Weight 202 pounds, height 62 inches,  blood pressure 110/80, pulse 64.  GENERAL:  Mr. Nave is a 60 year old Caucasian male who is alert and  oriented and pleasant and cooperative, in no acute distress.  HEENT:  Sclerae are clear, nonicteric.  Conjunctivae pink.  Left pupil is  irregular with some dilatation. Oropharynx pink and moist without any  lesions.  NECK:  Supple without any mass or thyromegaly.  CARDIAC:  Heart regular rate and rhythm with normal S1, S2, without any  murmurs, clicks, rubs, or gallops.  CHEST:  Lungs clear to auscultation bilaterally.  ABDOMEN:  Positive bowel sounds x4, soft, nontender, nondistended, without  palpable mass or hepatosplenomegaly.  No rebound tenderness or guarding.  Negative Murphy's sign.  No palpable hepatosplenomegaly or mass.  EXTREMITIES:  Good pedal pulses bilaterally.  SKIN:  Pink, warm, and dry,  without any rash or jaundice.  RECTAL:  There are no external lesions visualized.  Good sphincter tone.  No  internal masses palpated.  Small amount of light brown Hemoccult-negative  stool was obtained from the vault.   ASSESSMENT:  Mr. Iddings is a 60 year old Caucasian male with refractory  gastroesophageal reflux disease symptoms.  He also has episodic hematemesis  per his report.  He also notes solid food dysphagia.  Symptoms have been  somewhat responsive with 50% relief on Nexium 40 mg daily, although not  complete response.  I feel he should undergo further evaluation of his upper  gastrointestinal tract as he may have developed erosive reflux esophagitis  or complications of chronic gastroesophageal reflux disease, including  changes of Barrett's esophagus, web, ring, or stricture.  There is no  evidence of liver disease.   RECOMMENDATIONS:  1.  Will schedule an EGD with Dr. Jena Gauss in the near future.  I have      discussed this procedure including the risks and benefits, which include      but are not limited to bleeding, infection, perforation, and drug      reaction.  He agrees with this plan, and consent will be obtained.  He      would also like screening colonoscopy to be performed at the same time,      as it has been over 10 years since he has had this exam.  2.  Will check CBC and CMP today.  3.  Further recommendations pending procedure.  He should continue Protonix      40 mg daily for now.  4.  Further recommendations pending procedure.   We would like to thank Dr. Margo Common for allowing [place] to participate in the care  of Mr. Naill.     ________________________________________  ___________________________________________  Nicholas Lose, N.P.                  Jonathon Bellows, M.D.   KC/MEDQ  D:  06/20/2004  T:  06/20/2004  Job:  [id number]   cc:   Wyvonnia Lora  [address]  [place]  [place] [address]  Fax: [phone]

## 2011-03-10 NOTE — Op Note (Signed)
NAME:  [name] [name], [name] [name]               ACCOUNT NO.:  [id number]   MEDICAL RECORD NO.:  [id number]          PATIENT TYPE:  AMB   LOCATION:  DAY                           FACILITY:  [facility]   PHYSICIAN:  R. Roetta Sessions, M.D. DATE OF BIRTH:  May 02, 1951   DATE OF PROCEDURE:  07/12/2004  DATE OF DISCHARGE:                                 OPERATIVE REPORT   PROCEDURE:  Esophagogastroduodenoscopy with Elease Hashimoto dilation and screening  colonoscopy.   INDICATIONS:  The patient is a 60 year old gentleman with a recent episode  hematemesis and esophageal dysphagia.  He is desirous of colorectal cancer  screening. Colonoscopy and EGD are now being done.  He had a CBC and CMET  drawn prior to procedure but they were sent down to [facility].  They are  not available at this time.  EGD and colonoscopy are now being done.  This  approach has been discussed with the patient at length.  The potential  risks, benefits and alternatives have been reviewed, questions answered.  A  paternal aunt reportedly had a diagnosis of colorectal cancer diagnosed at a  very late age.  He has never had a colonoscopy.  Please see my documentation  in the medical record for more information.  The EGD with __________  dilation and colonoscopy were fully explained to the patient prior to the  procedure on July 12, 2004 and in the office.  Please see my dictated  consultation for more information.   DESCRIPTION OF PROCEDURE:  Oxygen saturation, blood pressure, pulse and  respiration were monitored throughout the entire procedure.  Conscious  sedation with Versed 4 mg IV, Demerol 75 mg IV in divided doses.  The  instrument was the Olympus video chip system.   EGD FINDINGS:  Esophagus:  Examination of the tubular esophagus revealed a  prominent Schatzki's ring. The remainder of the esophageal mucosa appeared  normal.  The EG junction was easily traversed.   Stomach:  The gastric cavity was empty and insufflated well with  air.  A  thorough examination of the gastric mucosa including the retroflexed view of  the proximal stomach and esophagogastric junction demonstrated only a small  hiatal hernia.  Pylorus was patent and easily traversed.  Examination of the  bulb and second portion revealed no abnormalities.   THERAPY AND DIAGNOSTIC MANEUVERS:  A 58-French Maloney dilator was passed to  full insertion. with good patient tolerance of the exam without blood or  ___________.  A look back revealed no apparent complications related to  passage of the dilator.  The ring had been successfully ruptured.  The  patient tolerated the procedure well and was prepared for colonoscopy.   COLONOSCOPY:  Digital rectal exam initially revealed no abnormalities.   ENDOSCOPIC FINDINGS:  Prep was fair to adequate.  Rectum:  Examination of the rectal mucosa including retroflexion in the anal  verge revealed no abnormalities.   Colon:  Colonic mucosa was surveyed from the rectosigmoid junction through  the left, transverse,  right colon to the area of the appendiceal orifice,  ileocecal valve and cecum.  These structures were  well seen and photographed  for the record.  From this level the scope was slowly withdrawn.  All  previously mentioned mucosal surfaces were again seen.  The colonic mucosa  appeared normal.  The patient tolerated the procedure well and was reactive  to endoscopy.   IMPRESSION/EGD:  1.  Schatzki's ring; otherwise normal esophagus, status post dilation as      described above.  2.  Small hiatal hernia.  3.  Normal stomach and normal D1 and D2.   IMPRESSION/COLONOSCOPY:  1.  Normal rectum.  2.  Normal colon.   RECOMMENDATIONS:  1.  Continue Nexium 40 mg daily for gastroesophageal reflux disease.  2.  Follow up on CBC and CMET as they become available.      RMR/MEDQ  D:  07/12/2004  T:  07/12/2004  Job:  [id number]   cc:   Wyvonnia Lora  [address]  [place]  [place] [address]  Fax: [phone]

## 2011-03-10 NOTE — Op Note (Signed)
NAME:  [name] [name], [name] [name]               ACCOUNT NO.:  [id number]   MEDICAL RECORD NO.:  [id number]          PATIENT TYPE:  AMB   LOCATION:  DAY                           FACILITY:  [facility]   PHYSICIAN:  Dennie Maizes, M.D.   DATE OF BIRTH:  [birth year]   DATE OF PROCEDURE:  09/21/2006  DATE OF DISCHARGE:                               OPERATIVE REPORT   PREOP DIAGNOSIS:  Recurrent balanitis.   POSTOP DIAGNOSIS:  Recurrent balanitis.   OPERATIVE PROCEDURE:  Circumcision.   ANESTHESIA:  Spinal.   SURGEON:  Dennie Maizes, M.D.   COMPLICATIONS:  None.   ESTIMATED BLOOD LOSS:  Minimal.   SPECIMEN:  Foreskin sent to path   INDICATIONS FOR THE PROCEDURE:  This 60 year old male had recurrent  balanitis.  He was taken to the operating room today for circumcision.   DESCRIPTION OF THE PROCEDURE:  Spinal anesthesia was induced and the  patient was placed on the OR table in the supine position.  The lower  abdomen and genitalia were prepped and draped in a sterile fashion.  Examination revealed recurrent balanitis with thickening of the  foreskin.  The foreskin was then clamped at the 6 and 12 o'clock  positions with straight hemostats.  Dorsal and ventral slits were made.  The lateral skin flaps were raised.   The redundant foreskin was then excised.  Hemostasis was obtained by  cauterization.  The edges of the foreskin were then approximated using 4-  0 chromic gut.  Vaseline gauze dressing and Coban were applied to the  penis.  The estimated blood loss was minimal.  Sponges and instruments  were correct x2 at the time of closure.  The patient was transferred to  the PACU in a satisfactory condition.      Dennie Maizes, M.D.  Electronically Signed     SK/MEDQ  D:  09/21/2006  T:  09/21/2006  Job:  [id number]   cc:   Wyvonnia Lora  Fax: [phone]

## 2011-03-10 NOTE — Discharge Summary (Signed)
NAME:  [name] [name], [name] [name] NO.:  [id number]   MEDICAL RECORD NO.:  [id number]          PATIENT TYPE:  OBV   LOCATION:  A228                          FACILITY:  [facility]   PHYSICIAN:  Vania Rea, M.D. DATE OF BIRTH:  20-Jun-1951   DATE OF ADMISSION:  07/29/2004  DATE OF DISCHARGE:  10/08/2005LH                                 DISCHARGE SUMMARY   PRIMARY CARE PHYSICIAN:  Wyvonnia Lora, M.D., [place]   CONSULTATIONS:  Kofi A. Gerilyn Pilgrim, M.D.   DISCHARGE DIAGNOSES:  1.  Recurrent syncope, probably vasovagal.  2.  Rule out seizure disorder.  3.  Anxiety disorder.  4.  Hypertension, controlled.   DISPOSITION:  Patient is discharged to home.   CONDITION ON DISCHARGE:  Stable.   DISCHARGE MEDICATIONS:  1.  Hydrochlorothiazide 25 mg daily.  2.  Lisinopril 40 mg daily.  3.  Nexium 40 mg daily.  4.  Valium 5 mg p.o. twice daily.   HOSPITAL COURSE:  Please refer to the admission history and physical.  This  is a 60 year old Caucasian gentleman who fainted in the hematology  laboratory where he came for routine blood work.  The fainting seemed to be  vasovagal, however, the patient had a period of post syncopal memory loss  and was admitted for observation and to rule out seizure disorder.  He was  seen by Dr. Gerilyn Pilgrim who felt that this episode was most likely vasovagal,  especially in terms of the patient's anxiety disorder and his confessed  aversion to blood, however, felt it would be useful to get an  electroencephalogram as an outpatient.   FOLLOW UP:  The patient is discharged to follow up with his primary care  physician Dr. Margo Common.  The patient is to call Dr. Gerilyn Pilgrim to arrange an  electroencephalogram as an outpatient.     Leop   LC/MEDQ  D:  08/03/2004  T:  08/03/2004  Job:  [id number]

## 2011-03-10 NOTE — Procedures (Signed)
The patient is a 60 year old with diplopia. The patient stated that he was  not able to see at all out of his left eye which was contradictory.   PROCEDURE:  The study was carried out using a 16 x 12 grid pattern  oscillating at 1.9 cycles per second. Filters ranged from 1 to 100 hertz. A  250-millisecond period was displayed following the stimulus. All latencies  and interpeak latencies were expressed in milliseconds.   DESCRIPTION OF FINDINGS:  Stimulation of the left eye produced well-defined  wave forms with excellent inner run correlation. Latencies were as follows:  N1:  65.46, P1:  101.61, N2:  145.57.   Similarly, stimulation of the right eye produced well-defined wave forms  with excellent inner run correlation. Latencies were as follows:  N1:  66.44, P1:  100.63, N2:  152.41.   IMPRESSION:  These pattern reversal visual evoked responses are within  normal limits and show no evidence of conduction abnormality in the anterior  visual pathways of either eye. The wave form for the left eye was actually  somewhat more crisp, although both were somewhat broad based. Amplitudes  were somewhat greater in the right eye than the left. The decrease  amplitudes may reflect decreased visual acuity; however, it is good enough  that there was no sign of conduction abnormality in the visual pathways.  This is a normal study.    WILLIAM H. Sharene Skeans, M.D.   JYN:WGNF  D:  06/14/2004 19:12:26  T:  06/15/2004 11:21:35  Job #:  [id number]

## 2011-05-30 ENCOUNTER — Encounter: Payer: Self-pay | Admitting: Cardiology

## 2011-07-18 LAB — CBC
HCT: 44.1
HCT: 44.5
Hemoglobin: 15.2
Hemoglobin: 15.5
MCHC: 34.2
MCHC: 35.2
MCV: 88.8
MCV: 89.9
Platelets: 210
Platelets: 219
RBC: 4.95
RBC: 4.96
RDW: 13.9
RDW: 14.1
WBC: 8.4
WBC: 9

## 2011-07-18 LAB — COMPREHENSIVE METABOLIC PANEL
ALT: 51
AST: 41 — ABNORMAL HIGH
Albumin: 4.2
Alkaline Phosphatase: 47
BUN: 21
CO2: 31
Calcium: 9.6
Chloride: 98
Creatinine, Ser: 1.28
GFR calc Af Amer: >60
GFR calc non Af Amer: 59 — ABNORMAL LOW
Glucose, Bld: 127 — ABNORMAL HIGH
Potassium: 4
Sodium: 139
Total Bilirubin: 2 — ABNORMAL HIGH
Total Protein: 7.1

## 2011-07-18 LAB — PROTIME-INR
INR: 0.9
Prothrombin Time: 12.4

## 2011-07-18 LAB — POCT I-STAT 3, VENOUS BLOOD GAS (G3P V)
Acid-Base Excess: 1
Bicarbonate: 27.7 — ABNORMAL HIGH
O2 Saturation: 67
Operator id: 211741
TCO2: 29
pCO2, Ven: 49.9
pH, Ven: 7.353 — ABNORMAL HIGH
pO2, Ven: 37

## 2011-07-18 LAB — BASIC METABOLIC PANEL
BUN: 19
BUN: 23
CO2: 29
CO2: 29
Calcium: 8.7
Calcium: 8.9
Chloride: 100
Chloride: 102
Creatinine, Ser: 1.24
Creatinine, Ser: 1.27
GFR calc Af Amer: >60
GFR calc Af Amer: >60
GFR calc non Af Amer: 59 — ABNORMAL LOW
GFR calc non Af Amer: >60
Glucose, Bld: 107 — ABNORMAL HIGH
Glucose, Bld: 112 — ABNORMAL HIGH
Potassium: 3.3 — ABNORMAL LOW
Potassium: 3.4 — ABNORMAL LOW
Sodium: 137
Sodium: 139

## 2011-07-18 LAB — APTT: aPTT: 29

## 2011-07-18 LAB — POCT I-STAT 3, ART BLOOD GAS (G3+)
Acid-base deficit: 1
Bicarbonate: 25.6 — ABNORMAL HIGH
O2 Saturation: 92
Operator id: 211741
TCO2: 27
pCO2 arterial: 46.8 — ABNORMAL HIGH
pH, Arterial: 7.346 — ABNORMAL LOW
pO2, Arterial: 68 — ABNORMAL LOW

## 2011-07-18 LAB — LIPID PANEL
Cholesterol: 141
HDL: 20 — ABNORMAL LOW
LDL Cholesterol: 82
Total CHOL/HDL Ratio: 7.1
Triglycerides: 195 — ABNORMAL HIGH
VLDL: 39

## 2011-07-18 LAB — TSH: TSH: 2.095

## 2011-11-10 ENCOUNTER — Other Ambulatory Visit: Payer: Self-pay | Admitting: Cardiology

## 2011-11-10 NOTE — Telephone Encounter (Signed)
[place] patient

## 2013-07-26 ENCOUNTER — Emergency Department (HOSPITAL_COMMUNITY): Payer: Medicaid Other

## 2013-07-26 ENCOUNTER — Encounter (HOSPITAL_COMMUNITY): Payer: Self-pay | Admitting: Emergency Medicine

## 2013-07-26 ENCOUNTER — Emergency Department (HOSPITAL_COMMUNITY)
Admission: EM | Admit: 2013-07-26 | Discharge: 2013-07-26 | Disposition: A | Discharge status: Home / Self Care | Payer: Medicaid Other | Attending: Emergency Medicine | Admitting: Emergency Medicine

## 2013-07-26 DIAGNOSIS — F341 Dysthymic disorder: Secondary | ICD-10-CM | POA: Insufficient documentation

## 2013-07-26 DIAGNOSIS — R5381 Other malaise: Secondary | ICD-10-CM | POA: Insufficient documentation

## 2013-07-26 DIAGNOSIS — R4182 Altered mental status, unspecified: Secondary | ICD-10-CM | POA: Insufficient documentation

## 2013-07-26 DIAGNOSIS — Z862 Personal history of diseases of the blood and blood-forming organs and certain disorders involving the immune mechanism: Secondary | ICD-10-CM | POA: Insufficient documentation

## 2013-07-26 DIAGNOSIS — Z8639 Personal history of other endocrine, nutritional and metabolic disease: Secondary | ICD-10-CM | POA: Insufficient documentation

## 2013-07-26 DIAGNOSIS — I10 Essential (primary) hypertension: Secondary | ICD-10-CM | POA: Insufficient documentation

## 2013-07-26 DIAGNOSIS — Z9989 Dependence on other enabling machines and devices: Secondary | ICD-10-CM | POA: Insufficient documentation

## 2013-07-26 DIAGNOSIS — G4733 Obstructive sleep apnea (adult) (pediatric): Secondary | ICD-10-CM | POA: Insufficient documentation

## 2013-07-26 DIAGNOSIS — Z79899 Other long term (current) drug therapy: Secondary | ICD-10-CM | POA: Insufficient documentation

## 2013-07-26 DIAGNOSIS — M6281 Muscle weakness (generalized): Secondary | ICD-10-CM

## 2013-07-26 DIAGNOSIS — R531 Weakness: Secondary | ICD-10-CM

## 2013-07-26 DIAGNOSIS — H5440 Blindness, one eye, unspecified eye: Secondary | ICD-10-CM | POA: Insufficient documentation

## 2013-07-26 DIAGNOSIS — Z8719 Personal history of other diseases of the digestive system: Secondary | ICD-10-CM | POA: Insufficient documentation

## 2013-07-26 LAB — POCT I-STAT TROPONIN I: Troponin i, poc: 0 ng/mL (ref 0.00–0.08)

## 2013-07-26 LAB — POCT I-STAT, CHEM 8
Calcium, Ion: 1.17 mmol/L (ref 1.13–1.30)
Creatinine, Ser: 1.1 mg/dL (ref 0.50–1.35)
HCT: 49 % (ref 39.0–52.0)
Hemoglobin: 16.7 g/dL (ref 13.0–17.0)
Potassium: 3.8 mEq/L (ref 3.5–5.1)
Sodium: 141 mEq/L (ref 135–145)
TCO2: 25 mmol/L (ref 0–100)

## 2013-07-26 LAB — RAPID URINE DRUG SCREEN, HOSP PERFORMED
Benzodiazepines: NOT DETECTED
Cocaine: NOT DETECTED
Opiates: NOT DETECTED
Tetrahydrocannabinol: NOT DETECTED

## 2013-07-26 LAB — CBC
HCT: 45.5 % (ref 39.0–52.0)
Hemoglobin: 16.5 g/dL (ref 13.0–17.0)
MCV: 85.4 fL (ref 78.0–100.0)
RDW: 13.6 % (ref 11.5–15.5)
WBC: 7.9 10*3/uL (ref 4.0–10.5)

## 2013-07-26 LAB — URINALYSIS, ROUTINE W REFLEX MICROSCOPIC
Bilirubin Urine: NEGATIVE
Glucose, UA: 250 mg/dL — AB
Hgb urine dipstick: NEGATIVE
Ketones, ur: NEGATIVE mg/dL
pH: 5.5 (ref 5.0–8.0)

## 2013-07-26 LAB — COMPREHENSIVE METABOLIC PANEL
ALT: 59 U/L — ABNORMAL HIGH (ref 0–53)
AST: 41 U/L — ABNORMAL HIGH (ref 0–37)
Albumin: 3.9 g/dL (ref 3.5–5.2)
CO2: 23 mEq/L (ref 19–32)
Calcium: 8.8 mg/dL (ref 8.4–10.5)
Creatinine, Ser: 0.94 mg/dL (ref 0.50–1.35)
GFR calc Af Amer: >90 mL/min (ref >90)
GFR calc non Af Amer: 89 mL/min — ABNORMAL LOW (ref >90)
Glucose, Bld: 234 mg/dL — ABNORMAL HIGH (ref 70–99)
Total Protein: 7.3 g/dL (ref 6.0–8.3)

## 2013-07-26 LAB — ETHANOL: Alcohol, Ethyl (B): <11 mg/dL (ref 0–11)

## 2013-07-26 LAB — DIFFERENTIAL
Basophils Absolute: 0 10*3/uL (ref 0.0–0.1)
Eosinophils Absolute: 0.3 10*3/uL (ref 0.0–0.7)
Eosinophils Relative: 4 % (ref 0–5)
Lymphocytes Relative: 22 % (ref 12–46)
Monocytes Absolute: 0.6 10*3/uL (ref 0.1–1.0)

## 2013-07-26 LAB — APTT: aPTT: 28 seconds (ref 24–37)

## 2013-07-26 LAB — GLUCOSE, CAPILLARY: Glucose-Capillary: 205 mg/dL — ABNORMAL HIGH (ref 70–99)

## 2013-07-26 MED ORDER — LORAZEPAM 2 MG/ML IJ SOLN
INTRAMUSCULAR | Status: AC
  Filled 2013-07-26: qty 1

## 2013-07-26 NOTE — ED Provider Notes (Signed)
CSN: [id number]     Arrival date & time 07/26/13  1731 History   First MD Initiated Contact with Patient 07/26/13 1741     Chief Complaint  Patient presents with  . Code Stroke   (Consider location/radiation/quality/duration/timing/severity/associated sxs/prior Treatment) Patient is a 62 y.o. male presenting with altered mental status.  Altered Mental Status Presenting symptoms: unresponsiveness   Presenting symptoms comment:  L sided weakness and facial droop Severity:  Moderate Most recent episode:  Today Episode history:  Single Duration:  45 minutes Timing:  Constant Progression:  Partially resolved Chronicity:  New Associated symptoms: no vomiting     Past Medical History  Diagnosis Date  . Hypertension   . Borderline diabetes mellitus   . 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 GERD (gastroesophageal reflux disease)   . Obstructive sleep apnea on CPAP   . Depression with anxiety    Past Surgical History  Procedure Laterality Date  . Umbilical hernia repair    . Tonsillectomy    . Rectal surgery      As an infant   Family History  Problem Relation Age of Onset  . Dementia Other   . Alzheimer's disease Other   . Diabetes Other   . Coronary artery disease Other   . Heart attack Other   . Cancer Other   . Lung cancer Other   . Sudden death Sister   . Colon cancer Neg Hx    History  Substance Use Topics  . Smoking status: Never Smoker   . Smokeless tobacco: Never Used  . Alcohol Use: No    Review of Systems  Unable to perform ROS: Mental status change  Gastrointestinal: Negative for vomiting.    Allergies  Review of patient's allergies indicates no known allergies.  Home Medications   Current Outpatient Rx  Name  Route  Sig  Dispense  Refill  . mirtazapine (REMERON) 15 MG tablet   Oral   Take 15 mg by mouth daily.           . nitroGLYCERIN (NITROSTAT) 0.4 MG SL tablet   Sublingual   Place 0.4 mg under the tongue every 5  (five) minutes as needed for chest pain.         Marland Kitchen sertraline (ZOLOFT) 100 MG tablet   Oral   Take 100 mg by mouth daily.           . simvastatin (ZOCOR) 40 MG tablet   Oral   Take 40 mg by mouth daily.            BP 158/84  Pulse 92  Temp(Src) 97.9 F (36.6 C) (Oral)  Resp 14  SpO2 100% Physical Exam  Vitals reviewed. Constitutional: He appears well-developed and well-nourished.  HENT:  Head: Normocephalic and atraumatic.  Eyes: Conjunctivae and EOM are normal.  Neck: Normal range of motion. Neck supple.  Cardiovascular: Normal rate, regular rhythm and normal heart sounds.   Pulmonary/Chest: Effort normal and breath sounds normal. No respiratory distress.  Abdominal: He exhibits no distension. There is no tenderness. There is no rebound and no guarding.  Musculoskeletal: Normal range of motion.  Neurological: He is alert.  L sided not following commands but with retained self preservation  Skin: Skin is warm and dry.    ED Course  Procedures (including critical care time) Labs Review Labs Reviewed  GLUCOSE, CAPILLARY - Abnormal; Notable for the following:    Glucose-Capillary 205 (*)  All other components within normal limits  CBC - Abnormal; Notable for the following:    MCHC 36.3 (*)    All other components within normal limits  COMPREHENSIVE METABOLIC PANEL - Abnormal; Notable for the following:    Glucose, Bld 234 (*)    AST 41 (*)    ALT 59 (*)    Total Bilirubin 2.0 (*)    GFR calc non Af Amer 89 (*)    All other components within normal limits  URINALYSIS, ROUTINE W REFLEX MICROSCOPIC - Abnormal; Notable for the following:    Glucose, UA 250 (*)    All other components within normal limits  POCT I-STAT, CHEM 8 - Abnormal; Notable for the following:    Glucose, Bld 237 (*)    All other components within normal limits  ETHANOL  PROTIME-INR  APTT  DIFFERENTIAL  URINE RAPID DRUG SCREEN (HOSP PERFORMED)  POCT I-STAT TROPONIN I   Imaging  Review Dg Chest 2 View  07/26/2013   *RADIOLOGY REPORT*  Clinical Data: Code stroke.  CHEST - 2 VIEW  Comparison: Chest radiograph performed 01/23/2008  Findings: The lungs are well-aerated.  Pulmonary vascularity is at the upper limits of normal.  There is no evidence of focal opacification, pleural effusion or pneumothorax.  The heart is normal in size; the mediastinal contour is within normal limits.  No acute osseous abnormalities are seen.  IMPRESSION: No acute cardiopulmonary process seen.   Original Report Authenticated By: Tonia Ghent, M.D.   Ct Head (brain) Wo Contrast  07/26/2013   CLINICAL DATA:  Code stroke. Left-sided weakness, facial droop, confusion. Fall.  EXAM: CT HEAD WITHOUT CONTRAST  TECHNIQUE: Contiguous axial images were obtained from the base of the skull through the vertex without intravenous contrast.  COMPARISON:  07/29/2004  FINDINGS: No acute hemorrhage, infarct, or mass lesion is identified. Orbits and paranasal sinuses are intact. No midline shift. No skull fracture.  IMPRESSION: No acute intracranial finding. Normal exam. These results were called by telephone at the time of interpretation on 07/26/2013 at 5:51 PM to Dr. Redgie Grayer , who verbally acknowledged these results.   Electronically Signed   By: Christiana Pellant M.D.   On: 07/26/2013 17:52      Date: 07/26/2013  Rate: 79  Rhythm: normal sinus rhythm  QRS Axis: normal  Intervals: normal  ST/T Wave abnormalities: normal  Conduction Disutrbances:none  Narrative Interpretation:   Old EKG Reviewed: unchanged   MDM   1. Weakness    62 y.o. male  with pertinent PMH of HTN, GERD, depression with anxiety, L eye blindness presents as code stroke after acute onset L sided weakness and fall.  On arrival pt with L sided not following commands, however with retained self preservation.  Pt with L sided blindness chronically.  Rest of physical exam unremarkable.  CT head and labs as above urnemarkable.  Pt had seizure like  activity in scanner with retained self preservation and which broke with noxious stimuli.  Rediscussed results, and informed pt.  Pt then complained of chest pain for 4 days.  Troponin negative, cxr unremarkable.  Pt then began to complain of bil leg weakness.  Physical exam initially with poor effort, however pt ambulated without assistance.  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  He frankly denies si, hi, however has had chronic hallucinations since [place].  No recent changes.  Doubt CVA, MI, ACS, dissection, or other  emergent pathology.   DC home in stable condition.    Labs and imaging as above reviewed by myself and attending,Dr. Redgie Grayer, with whom case was discussed.   1. Weakness         Noel Gerold, MD 07/26/13 2351

## 2013-07-26 NOTE — Consult Note (Signed)
Neurology Consultation Reason for Consult: Code stroke Referring Physician: Redgie Grayer, D  CC: Initially left-sided weakness, subsequently right-sided shaking, followed by bilateral lower extremity weakness  History is obtained from: Patient  HPI: [name] [name] is a 62 y.o. male with a history of hypertension diabetes who presents with various symptoms following a fall. Initial arrival, the patient had left-sided weakness and there is concern for stroke, however there is some consistency in his exam. In the CT scan or, he began to have rhythmic right arm shaking, with his head held flat out (karate chop style) with forced eye closure. Noxious stimulus aborted this episode. He then complained of inability to feel anything on his left side, however noxious stimulus induced withdrawal and grimace, than when asked if he felt it, he denied it.   He then began complaining of inability to squeeze either hand, or lift either leg. With distraction maneuvers he was able to lift both legs out of bed, though only when he was not meaning to. He complained of not being able to grip his left hand or move his left arm, but then when asked to remove his glasses, he used his left arm to do so.  When asked if he has been under much stress lately, his answers is "son you have no idea." After his wife was contacted, it turns out that they're having marital "strife."  ROS: A 14 point ROS was performed and is negative except as noted in the HPI.   Past Medical History  Diagnosis Date  . Hypertension   . Borderline diabetes mellitus   . 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 GERD (gastroesophageal reflux disease)   . Obstructive sleep apnea on CPAP   . Depression with anxiety     Family History: Dementia, CAD  Social History: Tob: Never smoker  Exam: Current vital signs: BP 156/89  Pulse 85  Temp(Src) 98.2 F (36.8 C) (Oral)  Resp 18  SpO2 96% Vital signs in last 24 hours: Temp:  [98.2  F (36.8 C)] 98.2 F (36.8 C) (10/04 1800) Pulse Rate:  [85] 85 (10/04 1800) Resp:  [18] 18 (10/04 1800) BP: (156)/(89) 156/89 mmHg (10/04 1800) SpO2:  [96 %] 96 % (10/04 1800)   See above for descriptions of various spells. General: In bed CV: Regular rate and rhythm Mental Status: Patient is awake, alert, oriented to person, place, month, year, and situation. Immediate and remote memory are intact. Patient is able to give a clear and coherent history. No signs of aphasia or neglect Cranial Nerves: II: Visual Fields are full in right eye, difficulty seeing out of the left eye (legally blind). Pupils are equal, round, and reactive to light bilaterally.  Discs are difficult to visualize. III,IV, VI: EOMI without ptosis or diploplia.  V: Facial sensation is decreased on left VII: Facial movement is symmetric, though he intermittently seems to have difficulty with both sides VIII: hearing is intact to voice X: Uvula elevates symmetrically XI: Shoulder shrug is symmetric. XII: tongue is midline without atrophy or fasciculations.  Motor: Tone is normal. Bulk is normal. Strength exam is markedly inconsistent, and at times he has full-strength seemingly, while at other times he has significant give way weakness in all extremities. Sensory: Sensation is absent on the left side, however he does respond to noxious stimuli with grimace and movements on the side and then deny having felt it. Deep Tendon Reflexes: 2+ and symmetric in the biceps and  patellae.  Cerebellar: FNF and HKS are intact bilaterally Gait: Not tested this patient claiming to have no strength in either lower extremity.   I have reviewed labs in epic and the results pertinent to this consultation are: BMP-elevated glucose  I have reviewed the images obtained: CT head-negative  Impression: 62 year old male with a myriad of complaints, some bilateral, some on the right, some on the left. 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 At this time, I do not recommend further workup given the various complaints that he has had over the course of the hour. I had an extensive discussion with the patient about the nature of conversion disorder in that this is the diagnosis that is suspected.  Recommendations: 1) psychological counseling 2) encourage patient to improve 3) if the patient continues to exhibit signs and symptoms of dysfunction that cannot be deemed psychogenic, then further workup may be indicated at that time.   Ritta Slot, MD [facility] [phone]  If 7pm- 7am, please page neurology on call at [phone].

## 2013-07-26 NOTE — ED Notes (Addendum)
Pt arrived by [facility] c/o Code Stroke. [place] something to drink approx 1630 came back and bystanders saw pt fall. Abrasion to posterior side of head, bleeding controlled. EMS arrived and noted pt to have left sided facial droop and "studdering". Upon arrival to ED no facial droop noted. CBG 131  BP-190/100 HR-89 Pt is legally blind in left eye.

## 2013-07-27 NOTE — ED Provider Notes (Signed)
I saw and evaluated the patient, reviewed the resident's note and I agree with the findings and plan.  EKG reviewed by me and I agree with the findings as documented.  Patient had a confounding presentation. He had a negative CT head and ER workup. Neurology evaluated the patient and did not feel a seizure or stroke or TIA was likely.  Patient had a prolonged ER stay while being evaluated and during which time his symptoms resolved. He was able to ambulate without assistance. At this point I do not believe he had a stroke, seizure, TIA, coronary event, serious bacterial illness, or other emergent etiology. I suspect his presentation is multifactorial with some psychiatric component involved. Patient be discharged with [facility] followup ER precautions were given.  Darlys Gales, MD 07/27/13 [phone]

## 2013-09-21 ENCOUNTER — Emergency Department (HOSPITAL_COMMUNITY)
Admission: EM | Admit: 2013-09-21 | Discharge: 2013-09-21 | Disposition: A | Discharge status: Home / Self Care | Payer: Medicaid Other | Attending: Emergency Medicine | Admitting: Emergency Medicine

## 2013-09-21 ENCOUNTER — Emergency Department (HOSPITAL_COMMUNITY): Payer: Medicaid Other

## 2013-09-21 ENCOUNTER — Encounter (HOSPITAL_COMMUNITY): Payer: Self-pay | Admitting: Emergency Medicine

## 2013-09-21 DIAGNOSIS — Z8719 Personal history of other diseases of the digestive system: Secondary | ICD-10-CM | POA: Insufficient documentation

## 2013-09-21 DIAGNOSIS — Z79899 Other long term (current) drug therapy: Secondary | ICD-10-CM | POA: Insufficient documentation

## 2013-09-21 DIAGNOSIS — H5440 Blindness, one eye, unspecified eye: Secondary | ICD-10-CM | POA: Insufficient documentation

## 2013-09-21 DIAGNOSIS — I10 Essential (primary) hypertension: Secondary | ICD-10-CM | POA: Insufficient documentation

## 2013-09-21 DIAGNOSIS — F341 Dysthymic disorder: Secondary | ICD-10-CM | POA: Insufficient documentation

## 2013-09-21 DIAGNOSIS — W108XXA Fall (on) (from) other stairs and steps, initial encounter: Secondary | ICD-10-CM | POA: Insufficient documentation

## 2013-09-21 DIAGNOSIS — Y9389 Activity, other specified: Secondary | ICD-10-CM | POA: Insufficient documentation

## 2013-09-21 DIAGNOSIS — S0993XA Unspecified injury of face, initial encounter: Secondary | ICD-10-CM | POA: Insufficient documentation

## 2013-09-21 DIAGNOSIS — T07XXXA Unspecified multiple injuries, initial encounter: Secondary | ICD-10-CM | POA: Insufficient documentation

## 2013-09-21 DIAGNOSIS — R5381 Other malaise: Secondary | ICD-10-CM | POA: Insufficient documentation

## 2013-09-21 DIAGNOSIS — G4733 Obstructive sleep apnea (adult) (pediatric): Secondary | ICD-10-CM | POA: Insufficient documentation

## 2013-09-21 DIAGNOSIS — Y929 Unspecified place or not applicable: Secondary | ICD-10-CM | POA: Insufficient documentation

## 2013-09-21 DIAGNOSIS — IMO0002 Reserved for concepts with insufficient information to code with codable children: Secondary | ICD-10-CM | POA: Insufficient documentation

## 2013-09-21 MED ORDER — OXYCODONE-ACETAMINOPHEN 5-325 MG PO TABS
2.0000 | ORAL_TABLET | Freq: Once | ORAL | Status: AC
  Administered 2013-09-21: 2 via ORAL
  Filled 2013-09-21: qty 2

## 2013-09-21 MED ORDER — HYDROCODONE-ACETAMINOPHEN 5-325 MG PO TABS: 2.0000 | ORAL_TABLET | ORAL | Status: DC | PRN

## 2013-09-21 MED ORDER — IOHEXOL 300 MG/ML  SOLN: 80.0000 mL | Freq: Once | INTRAMUSCULAR | Status: DC | PRN

## 2013-09-21 MED ORDER — SODIUM CHLORIDE 0.9 % IV SOLN: INTRAVENOUS | Status: DC

## 2013-09-21 MED ORDER — DIAZEPAM 5 MG PO TABS
10.0000 mg | ORAL_TABLET | Freq: Once | ORAL | Status: AC
  Administered 2013-09-21: 10 mg via ORAL
  Filled 2013-09-21: qty 2

## 2013-09-21 NOTE — ED Provider Notes (Signed)
CSN: [id number]     Arrival date & time 09/21/13  1629 History  This chart was scribed for Toy Baker, MD by Blanchard Kelch, ED Scribe. The patient was seen in room APA18/APA18. Patient's care was started at 4:36 PM.    Chief Complaint  Patient presents with  . Fall    Patient is a 62 y.o. male presenting with fall. The history is provided by the patient. No language interpreter was used.  Fall Associated symptoms include abdominal pain.    HPI Comments: IZAK ANDING is a 62 y.o. male brought in by ambulance, who presents to the Emergency Department due to a fall that occurred just prior to arrival. He was going down stairs and slipped after he heard "roaring in his ears". He denies loss of consciousness with the fall. He is complaining of constant mid and lower back pain onset immediately after the fall. He also has associated mild neck and abdominal pain and weakness in his right leg and left arm. He describes the pain in his back as sharp and it is worsened by movement. He is currently taking HCTZ and aspirin. He denies being sick within the last 24 hours. He had a similar fall last week with similar ear symptoms and has had recurrent episodes of the symptoms in the past that he has seen his PCP for.   Past Medical History  Diagnosis Date  . Hypertension   . Borderline diabetes mellitus   . 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 GERD (gastroesophageal reflux disease)   . Obstructive sleep apnea on CPAP   . Depression with anxiety    Past Surgical History  Procedure Laterality Date  . Umbilical hernia repair    . Tonsillectomy    . Rectal surgery      As an infant   Family History  Problem Relation Age of Onset  . Dementia Other   . Alzheimer's disease Other   . Diabetes Other   . Coronary artery disease Other   . Heart attack Other   . Cancer Other   . Lung cancer Other   . Sudden death Sister   . Colon cancer Neg Hx    History  Substance Use  Topics  . Smoking status: Never Smoker   . Smokeless tobacco: Never Used  . Alcohol Use: No    Review of Systems  Gastrointestinal: Positive for abdominal pain.  Musculoskeletal: Positive for back pain and neck pain.  Neurological: Positive for weakness. Negative for syncope.  All other systems reviewed and are negative.    Allergies  Review of patient's allergies indicates no known allergies.  Home Medications   Current Outpatient Rx  Name  Route  Sig  Dispense  Refill  . mirtazapine (REMERON) 15 MG tablet   Oral   Take 15 mg by mouth daily.           . nitroGLYCERIN (NITROSTAT) 0.4 MG SL tablet   Sublingual   Place 0.4 mg under the tongue every 5 (five) minutes as needed for chest pain.         Marland Kitchen sertraline (ZOLOFT) 100 MG tablet   Oral   Take 100 mg by mouth daily.           . simvastatin (ZOCOR) 40 MG tablet   Oral   Take 40 mg by mouth daily.            Triage Vitals: BP 137/93  Pulse 101  Temp(Src) 99.1 F (37.3 C) (Oral)  Resp 18  SpO2 98%  Physical Exam  Nursing note and vitals reviewed. Constitutional: He is oriented to person, place, and time. He appears well-developed and well-nourished.  Non-toxic appearance. No distress.  HENT:  Head: Normocephalic and atraumatic.  Eyes: Conjunctivae, EOM and lids are normal. Pupils are equal, round, and reactive to light.  Neck: Normal range of motion. Neck supple. No tracheal deviation present. No mass present.  Cardiovascular: Normal rate, regular rhythm and normal heart sounds.  Exam reveals no gallop.   No murmur heard. Pulmonary/Chest: Effort normal and breath sounds normal. No stridor. No respiratory distress. He has no decreased breath sounds. He has no wheezes. He has no rhonchi. He has no rales.  Abdominal: Soft. Normal appearance and bowel sounds are normal. He exhibits no distension. There is no tenderness. There is no rebound and no CVA tenderness.  Musculoskeletal: Normal range of motion. He  exhibits no edema and no tenderness.  Full ROM lower extremities. Tender to palpation of cervical, thoracic, and lumbar spine. Paraspinal tenderness along entire spine. No deformity noted   Neurological: He is alert and oriented to person, place, and time. He has normal strength. No cranial nerve deficit or sensory deficit. GCS eye subscore is 4. GCS verbal subscore is 5. GCS motor subscore is 6.  Skin: Skin is warm and dry. No abrasion and no rash noted.  Psychiatric: He has a normal mood and affect. His speech is normal and behavior is normal.    ED Course  Procedures (including critical care time)  DIAGNOSTIC STUDIES: Oxygen Saturation is 98% on room air, normal by my interpretation.    COORDINATION OF CARE: 4:42 PM -Will order femur, thoracic and lumbar spine x-rays and head and cervical spine CT. Patient verbalizes understanding and agrees with treatment plan.    Labs Review Labs Reviewed - No data to display Imaging Review Dg Thoracic Spine 4v  09/21/2013   CLINICAL DATA:  Fall down stairs.  Back pain.  EXAM: THORACIC SPINE - 4+ VIEW  COMPARISON:  07/26/2013  FINDINGS: Thoracic spondylosis noted without thoracic fracture or subluxation. Borderline prominence of left paraspinal soft tissues, most likely incidental.  IMPRESSION: Borderline prominence of left paraspinal soft tissues, but without discrete fracture or subluxation observed. If physical exam findings are compelling, chest CT might be utilized for further characterization.   Electronically Signed   By: Herbie Baltimore M.D.   On: 09/21/2013 18:23   Dg Lumbar Spine Complete  09/21/2013   CLINICAL DATA:  Fall down stairs.  Back pain.  EXAM: LUMBAR SPINE - COMPLETE 4+ VIEW  COMPARISON:  07/07/2013  FINDINGS: Lumbar spondylosis noted. Hernia mesh projects over the lumbosacral junction.  Mild bilateral facet arthropathy noted at L5-S1 and S1-2 bilaterally. The S1 vertebra is segmental.  No fracture or subluxation observed.   IMPRESSION: 1. Lower lumbar spondylosis.  No acute bony findings. 2. Segmental S1 vertebra.   Electronically Signed   By: Herbie Baltimore M.D.   On: 09/21/2013 18:24   Dg Femur Right  09/21/2013   CLINICAL DATA:  Fall down stairs.  Right leg pain.  EXAM: RIGHT FEMUR - 2 VIEW  COMPARISON:  None.  FINDINGS: There is no evidence of fracture or other focal bone lesions. Soft tissues are unremarkable.  IMPRESSION: Negative.   Electronically Signed   By: Herbie Baltimore M.D.   On: 09/21/2013 18:24   Ct Head Wo Contrast  09/21/2013   CLINICAL DATA:  Pain.  Fell down stairs on back.  EXAM: CT HEAD WITHOUT CONTRAST  CT CERVICAL SPINE WITHOUT CONTRAST  TECHNIQUE: Multidetector CT imaging of the head and cervical spine was performed following the standard protocol without intravenous contrast. Multiplanar CT image reconstructions of the cervical spine were also generated.  COMPARISON:  Head CT 07/26/2013  FINDINGS: CT HEAD FINDINGS  No acute intracranial abnormality is identified. Specifically, there is no hemorrhage, hydrocephalus, mass lesion, mass effect, or evidence of acute cortically based infarction. The skull is intact. The visualized paranasal sinuses and mastoid air cells are clear. Soft tissues of the scalp are symmetric. Negative for scalp hematoma.  CT CERVICAL SPINE FINDINGS  Cervical spine is imaged from the skullbase through the superior aspect T1. The vertebral bodies are normal in height and alignment. The disc spaces are preserved. Facet joints are aligned. Early anterior osteophyte formation at C5-6 and C3-4. Negative for cervical spine fracture. The prevertebral soft tissue contour is normal.  IMPRESSION: 1. No acute intracranial abnormality. 2. No acute bony abnormality of the cervical spine.   Electronically Signed   By: Britta Mccreedy M.D.   On: 09/21/2013 17:50   Ct Cervical Spine Wo Contrast  09/21/2013   CLINICAL DATA:  Pain.  Fell down stairs on back.  EXAM: CT HEAD WITHOUT CONTRAST  CT  CERVICAL SPINE WITHOUT CONTRAST  TECHNIQUE: Multidetector CT imaging of the head and cervical spine was performed following the standard protocol without intravenous contrast. Multiplanar CT image reconstructions of the cervical spine were also generated.  COMPARISON:  Head CT 07/26/2013  FINDINGS: CT HEAD FINDINGS  No acute intracranial abnormality is identified. Specifically, there is no hemorrhage, hydrocephalus, mass lesion, mass effect, or evidence of acute cortically based infarction. The skull is intact. The visualized paranasal sinuses and mastoid air cells are clear. Soft tissues of the scalp are symmetric. Negative for scalp hematoma.  CT CERVICAL SPINE FINDINGS  Cervical spine is imaged from the skullbase through the superior aspect T1. The vertebral bodies are normal in height and alignment. The disc spaces are preserved. Facet joints are aligned. Early anterior osteophyte formation at C5-6 and C3-4. Negative for cervical spine fracture. The prevertebral soft tissue contour is normal.  IMPRESSION: 1. No acute intracranial abnormality. 2. No acute bony abnormality of the cervical spine.   Electronically Signed   By: Britta Mccreedy M.D.   On: 09/21/2013 17:50    EKG Interpretation   None       MDM  No diagnosis found.   I personally performed the services described in this documentation, which was scribed in my presence. The recorded information has been reviewed and is accurate.   8:20 PM Patient's x-rays reviewed and are negative. Pain medication given here and he feels better. Neurological exam repeated at time of discharge in stable  Toy Baker, MD 09/21/13 2020

## 2013-09-21 NOTE — ED Notes (Addendum)
Pt reports heard a "roaring" noise in ears, became dizzy, and fell.  C/o pain in neck, back, and r leg.  Also c/o weakness in r leg and left arm.    Pt rates pain at 10.  Pt says had similar episode Friday and fell.  CBG 160 per EMS.  Pt's bp was 173/112 with EMS, HR 101

## 2013-09-21 NOTE — ED Notes (Signed)
Pt carried to waiting room to wait for ride. Pt instructed to make slow intential movements to prevent vertigo affects.

## 2013-09-22 MED FILL — Hydrocodone-Acetaminophen Tab 5-325 MG: ORAL | Qty: 6 | Status: AC

## 2013-09-23 ENCOUNTER — Encounter (HOSPITAL_COMMUNITY): Payer: Self-pay | Admitting: Emergency Medicine

## 2013-09-23 ENCOUNTER — Emergency Department (HOSPITAL_COMMUNITY)
Admission: EM | Admit: 2013-09-23 | Discharge: 2013-09-23 | Disposition: A | Discharge status: Home / Self Care | Payer: Medicaid Other | Attending: Emergency Medicine | Admitting: Emergency Medicine

## 2013-09-23 DIAGNOSIS — Z7982 Long term (current) use of aspirin: Secondary | ICD-10-CM | POA: Insufficient documentation

## 2013-09-23 DIAGNOSIS — H5440 Blindness, one eye, unspecified eye: Secondary | ICD-10-CM | POA: Insufficient documentation

## 2013-09-23 DIAGNOSIS — L02214 Cutaneous abscess of groin: Secondary | ICD-10-CM

## 2013-09-23 DIAGNOSIS — L02219 Cutaneous abscess of trunk, unspecified: Secondary | ICD-10-CM | POA: Insufficient documentation

## 2013-09-23 DIAGNOSIS — I10 Essential (primary) hypertension: Secondary | ICD-10-CM | POA: Insufficient documentation

## 2013-09-23 DIAGNOSIS — M549 Dorsalgia, unspecified: Secondary | ICD-10-CM | POA: Insufficient documentation

## 2013-09-23 DIAGNOSIS — G8911 Acute pain due to trauma: Secondary | ICD-10-CM | POA: Insufficient documentation

## 2013-09-23 DIAGNOSIS — Z79899 Other long term (current) drug therapy: Secondary | ICD-10-CM | POA: Insufficient documentation

## 2013-09-23 DIAGNOSIS — G4733 Obstructive sleep apnea (adult) (pediatric): Secondary | ICD-10-CM | POA: Insufficient documentation

## 2013-09-23 DIAGNOSIS — F341 Dysthymic disorder: Secondary | ICD-10-CM | POA: Insufficient documentation

## 2013-09-23 DIAGNOSIS — Z8719 Personal history of other diseases of the digestive system: Secondary | ICD-10-CM | POA: Insufficient documentation

## 2013-09-23 MED ORDER — HYDROMORPHONE HCL PF 1 MG/ML IJ SOLN
1.0000 mg | Freq: Once | INTRAMUSCULAR | Status: AC
  Administered 2013-09-23: 1 mg via INTRAMUSCULAR
  Filled 2013-09-23: qty 1

## 2013-09-23 MED ORDER — SULFAMETHOXAZOLE-TRIMETHOPRIM 800-160 MG PO TABS: 1.0000 | ORAL_TABLET | Freq: Two times a day (BID) | ORAL | Status: DC

## 2013-09-23 MED ORDER — OXYCODONE-ACETAMINOPHEN 5-325 MG PO TABS: 2.0000 | ORAL_TABLET | ORAL | Status: DC | PRN

## 2013-09-23 MED ORDER — SULFAMETHOXAZOLE-TMP DS 800-160 MG PO TABS
1.0000 | ORAL_TABLET | Freq: Once | ORAL | Status: AC
  Administered 2013-09-23: 1 via ORAL
  Filled 2013-09-23: qty 1

## 2013-09-23 NOTE — ED Notes (Signed)
Pt reports pain in neck and back.  Reports that he was seen following a fall down steps on Sunday.  Reporting pain medications helped, but has no more.

## 2013-09-23 NOTE — ED Provider Notes (Signed)
CSN: [id number]     Arrival date & time 09/23/13  1934 History   This chart was scribed for Donnetta Hutching, MD by Bennett Scrape, ED Scribe. This patient was seen in room APA04/APA04 and the patient's care was started at 10:50 PM.   Chief Complaint  Patient presents with  . Back Pain    The history is provided by the patient and a caregiver. No language interpreter was used.    HPI Comments: [name] [name] is a 62 y.o. male who presents to the Emergency Department complaining of persistent diffuse back that started after a fall down his apartment steps 2 days ago. He was evaluated in the ED after the incident and states that no fxs were noted on CXR or CT scan. He was given 5 325 hydrocodone but has since run out. He f/u with Dr. Margo Common today but was not given any prescriptions for pain medication. He states that he has tried taking the muscle relaxers prescribed from the ED visit but states that they make him nauseated. He is here for pain management and denies an new symptoms.  He also reports an abscess to the left suprapubic area that has been present and non-changing for the past 3 weeks. No notable drainage.  Past Medical History  Diagnosis Date  . Hypertension   . Borderline diabetes mellitus   . 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 GERD (gastroesophageal reflux disease)   . Obstructive sleep apnea on CPAP   . Depression with anxiety    Past Surgical History  Procedure Laterality Date  . Umbilical hernia repair    . Tonsillectomy    . Rectal surgery      As an infant   Family History  Problem Relation Age of Onset  . Dementia Other   . Alzheimer's disease Other   . Diabetes Other   . Coronary artery disease Other   . Heart attack Other   . Cancer Other   . Lung cancer Other   . Sudden death Sister   . Colon cancer Neg Hx    History  Substance Use Topics  . Smoking status: Never Smoker   . Smokeless tobacco: Never Used  . Alcohol Use: No     Review of Systems  A complete 10 system review of systems was obtained and all systems are negative except as noted in the HPI and PMH.   Allergies  Review of patient's allergies indicates no known allergies.  Home Medications   Current Outpatient Rx  Name  Route  Sig  Dispense  Refill  . aspirin EC 81 MG tablet   Oral   Take 81 mg by mouth every morning.         . hydrochlorothiazide (HYDRODIURIL) 25 MG tablet   Oral   Take 25 mg by mouth daily.         . mirtazapine (REMERON) 15 MG tablet   Oral   Take 15 mg by mouth at bedtime.          . mupirocin ointment (BACTROBAN) 2 %   Nasal   Place 1 application into the nose 2 (two) times daily. *To be applied to cyst until gone*         . nitroGLYCERIN (NITROSTAT) 0.4 MG SL tablet   Sublingual   Place 0.4 mg under the tongue every 5 (five) minutes as needed for chest pain.         Marland Kitchen  sertraline (ZOLOFT) 100 MG tablet   Oral   Take 100 mg by mouth every morning.          . simvastatin (ZOCOR) 40 MG tablet   Oral   Take 40 mg by mouth every morning.           Triage Vitals: BP 153/83  Pulse 70  Temp(Src) 97.8 F (36.6 C) (Oral)  Resp 18  Ht 5\' 1"  (1.549 m)  Wt 202 lb (91.627 kg)  BMI 38.19 kg/m2  SpO2 98%  Physical Exam  Nursing note and vitals reviewed. Constitutional: He is oriented to person, place, and time. He appears well-developed and well-nourished.  HENT:  Head: Normocephalic and atraumatic.  Eyes: Conjunctivae and EOM are normal.  Neck: Normal range of motion. Neck supple.  Cardiovascular: Normal rate.   Pulmonary/Chest: Effort normal.  Abdominal: Soft. Bowel sounds are normal.  Area of erythema and induration approximately 2 cm with a central 3 mm black head in the left suprapubic region   Musculoskeletal: Normal range of motion.  Minimal paraspinal muscle tenderness to the upper and lower back. No midline spinal tenderness.  Neurological: He is alert and oriented to person, place,  and time.  Skin: Skin is warm and dry.  Psychiatric: He has a normal mood and affect. His behavior is normal.    ED Course  Procedures (including critical care time)  DIAGNOSTIC STUDIES: Oxygen Saturation is 98% on room air, normal by my interpretation.    COORDINATION OF CARE: 10:54 PM-Informed pt that continued pain is mostly musculoskeletal and the abscess appears to be healing. No I&D not necessary. Discussed discharge plan which includes pain medication and antibiotics with pt and pt agreed to plan. Also advised pt to follow up as needed and pt agreed. Addressed symptoms to return for with pt.   Labs Review Labs Reviewed - No data to display Imaging Review No results found.  EKG Interpretation   None       MDM  No diagnosis found. No new injuries. X-rays several days ago negative. Will treat his pain. Also Rx Septra DS twice a day for abscess in left suprapubic area I personally performed the services described in this documentation, which was scribed in my presence. The recorded information has been reviewed and is accurate.    Donnetta Hutching, MD 09/23/13 [phone]

## 2013-09-23 NOTE — ED Notes (Signed)
Pt alert & oriented x4. Patient given discharge instructions, paperwork & prescription(s). Patient instructed to stop at the registration desk to finish any additional paperwork. Patient verbalized understanding. Pt left department w/ no further questions. 

## 2013-09-23 NOTE — ED Notes (Signed)
Pt states pain meds have helped but does not have anymore. Pt seen by PCP & was given medication for rash.

## 2013-09-29 MED FILL — Oxycodone w/ Acetaminophen Tab 5-325 MG: ORAL | Qty: 6 | Status: AC

## 2013-09-30 ENCOUNTER — Encounter (HOSPITAL_COMMUNITY): Payer: Self-pay | Admitting: Emergency Medicine

## 2013-09-30 ENCOUNTER — Emergency Department (HOSPITAL_COMMUNITY)
Admission: EM | Admit: 2013-09-30 | Discharge: 2013-09-30 | Disposition: A | Discharge status: Home / Self Care | Payer: Medicaid Other | Attending: Emergency Medicine | Admitting: Emergency Medicine

## 2013-09-30 DIAGNOSIS — G8911 Acute pain due to trauma: Secondary | ICD-10-CM | POA: Insufficient documentation

## 2013-09-30 DIAGNOSIS — G4733 Obstructive sleep apnea (adult) (pediatric): Secondary | ICD-10-CM | POA: Insufficient documentation

## 2013-09-30 DIAGNOSIS — Z862 Personal history of diseases of the blood and blood-forming organs and certain disorders involving the immune mechanism: Secondary | ICD-10-CM | POA: Insufficient documentation

## 2013-09-30 DIAGNOSIS — Z79899 Other long term (current) drug therapy: Secondary | ICD-10-CM | POA: Insufficient documentation

## 2013-09-30 DIAGNOSIS — Z9981 Dependence on supplemental oxygen: Secondary | ICD-10-CM | POA: Insufficient documentation

## 2013-09-30 DIAGNOSIS — Z792 Long term (current) use of antibiotics: Secondary | ICD-10-CM | POA: Insufficient documentation

## 2013-09-30 DIAGNOSIS — Z7982 Long term (current) use of aspirin: Secondary | ICD-10-CM | POA: Insufficient documentation

## 2013-09-30 DIAGNOSIS — Z8639 Personal history of other endocrine, nutritional and metabolic disease: Secondary | ICD-10-CM | POA: Insufficient documentation

## 2013-09-30 DIAGNOSIS — I10 Essential (primary) hypertension: Secondary | ICD-10-CM | POA: Insufficient documentation

## 2013-09-30 DIAGNOSIS — F341 Dysthymic disorder: Secondary | ICD-10-CM | POA: Insufficient documentation

## 2013-09-30 DIAGNOSIS — Z8719 Personal history of other diseases of the digestive system: Secondary | ICD-10-CM | POA: Insufficient documentation

## 2013-09-30 DIAGNOSIS — H5440 Blindness, one eye, unspecified eye: Secondary | ICD-10-CM | POA: Insufficient documentation

## 2013-09-30 DIAGNOSIS — M549 Dorsalgia, unspecified: Secondary | ICD-10-CM | POA: Insufficient documentation

## 2013-09-30 MED ORDER — CEPHALEXIN 500 MG PO CAPS: 500.0000 mg | ORAL_CAPSULE | Freq: Four times a day (QID) | ORAL | Status: DC

## 2013-09-30 MED ORDER — HYDROCODONE-ACETAMINOPHEN 5-325 MG PO TABS: 1.0000 | ORAL_TABLET | ORAL | Status: DC | PRN

## 2013-09-30 MED ORDER — HYDROCODONE-ACETAMINOPHEN 5-325 MG PO TABS: 2.0000 | ORAL_TABLET | ORAL | Status: DC | PRN

## 2013-09-30 MED ORDER — HYDROCODONE-ACETAMINOPHEN 5-325 MG PO TABS: 2.0000 | ORAL_TABLET | Freq: Once | ORAL | Status: DC

## 2013-09-30 NOTE — ED Provider Notes (Signed)
CSN: [id number]     Arrival date & time 09/30/13  1741 History   First MD Initiated Contact with Patient 09/30/13 1941     Chief Complaint  Patient presents with  . Fall   (Consider location/radiation/quality/duration/timing/severity/associated sxs/prior Treatment) HPI Comments: Patient is a 62 year old male who presents for persistent pain to his right upper and low back. Pain is secondary to a fall on 09/21/2013. Patient states that pain has been constant since onset. It has mildly improved, but still waxes and wanes in severity. The pain is stabbing and nonradiating. It is significantly improved with hydrocodone and when lying flat and aggravated with movement. Patient denies any new injury or trauma as well as any bowel or bladder incontinence, numbness/tingling in his lower extremities, and inability to ambulate, and genital or perianal numbness.  Patient's nurse at bedside keeps stating that Dr. Adriana Simas keeps calling to monitor the patient's symptoms. Nurse states that Dr. Adriana Simas said that if the patient needed to return for further management this would be appropriate. Patient followed up with primary care doctor a few days ago and was given a prescription for muscle relaxers, but patient states that these make him very drowsy and nauseous.  Patient is a 62 y.o. male presenting with fall. The history is provided by the patient and a caregiver. No language interpreter was used.  Fall    Past Medical History  Diagnosis Date  . Hypertension   . Borderline diabetes mellitus   . 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 GERD (gastroesophageal reflux disease)   . Obstructive sleep apnea on CPAP   . Depression with anxiety    Past Surgical History  Procedure Laterality Date  . Umbilical hernia repair    . Tonsillectomy    . Rectal surgery      As an infant   Family History  Problem Relation Age of Onset  . Dementia Other   . Alzheimer's disease Other   . Diabetes Other     . Coronary artery disease Other   . Heart attack Other   . Cancer Other   . Lung cancer Other   . Sudden death Sister   . Colon cancer Neg Hx    History  Substance Use Topics  . Smoking status: Never Smoker   . Smokeless tobacco: Never Used  . Alcohol Use: No    Review of Systems  Musculoskeletal: Positive for back pain.  All other systems reviewed and are negative.    Allergies  Review of patient's allergies indicates no known allergies.  Home Medications   Current Outpatient Rx  Name  Route  Sig  Dispense  Refill  . aspirin EC 81 MG tablet   Oral   Take 81 mg by mouth every morning.         . cephALEXin (KEFLEX) 500 MG capsule   Oral   Take 1 capsule (500 mg total) by mouth 4 (four) times daily.   28 capsule   0   . hydrochlorothiazide (HYDRODIURIL) 25 MG tablet   Oral   Take 25 mg by mouth daily.         Marland Kitchen HYDROcodone-acetaminophen (NORCO/VICODIN) 5-325 MG per tablet   Oral   Take 1 tablet by mouth every 4 (four) hours as needed.   6 tablet   0   . HYDROcodone-acetaminophen (NORCO/VICODIN) 5-325 MG per tablet   Oral   Take 1 tablet by mouth every 4 (four) hours as needed.  13 tablet   0   . mirtazapine (REMERON) 15 MG tablet   Oral   Take 15 mg by mouth at bedtime.          . mupirocin ointment (BACTROBAN) 2 %   Nasal   Place 1 application into the nose 2 (two) times daily. *To be applied to cyst until gone*         . nitroGLYCERIN (NITROSTAT) 0.4 MG SL tablet   Sublingual   Place 0.4 mg under the tongue every 5 (five) minutes as needed for chest pain.         Marland Kitchen sertraline (ZOLOFT) 100 MG tablet   Oral   Take 100 mg by mouth every morning.          . simvastatin (ZOCOR) 40 MG tablet   Oral   Take 40 mg by mouth every morning.          . sulfamethoxazole-trimethoprim (SEPTRA DS) 800-160 MG per tablet   Oral   Take 1 tablet by mouth every 12 (twelve) hours.   14 tablet   0    BP 181/109  Pulse 95  Temp(Src) 98.1 F  (36.7 C) (Oral)  Resp 20  Ht 5\' 1"  (1.549 m)  Wt 202 lb (91.627 kg)  BMI 38.19 kg/m2  SpO2 99%  Physical Exam  Nursing note and vitals reviewed. Constitutional: He is oriented to person, place, and time. He appears well-developed and well-nourished. No distress.  HENT:  Head: Normocephalic and atraumatic.  Eyes: Conjunctivae and EOM are normal. No scleral icterus.  Neck: Normal range of motion.  Cardiovascular: Normal rate, regular rhythm and intact distal pulses.   Distal radial pulses 2+ b/l  Pulmonary/Chest: Effort normal. No respiratory distress. He has no wheezes. He has no rales.   He exhibits tenderness.  Musculoskeletal: Normal range of motion.  Limited ROM of back secondary to pain. TTP of R lumbosacral paraspinal muscles. No midline TTP, bony deformities, or step offs palpated.   Neurological: He is alert and oriented to person, place, and time. He has normal reflexes.  GCS 15. Patient moves extremities without ataxia. Ambulatory with normal gait and without assistance.  Skin: Skin is warm and dry. No rash noted. He is not diaphoretic. No erythema. No pallor.  Psychiatric: He has a normal mood and affect. His behavior is normal.    ED Course  Procedures (including critical care time) Labs Review Labs Reviewed - No data to display  Imaging Review  Dg Thoracic Spine 4v  09/21/2013   CLINICAL DATA:  Fall down stairs.  Back pain.  EXAM: THORACIC SPINE - 4+ VIEW  COMPARISON:  07/26/2013  FINDINGS: Thoracic spondylosis noted without thoracic fracture or subluxation. Borderline prominence of left paraspinal soft tissues, most likely incidental.  IMPRESSION: Borderline prominence of left paraspinal soft tissues, but without discrete fracture or subluxation observed. If physical exam findings are compelling, chest CT might be utilized for further characterization.   Electronically Signed   By: Herbie Baltimore M.D.   On: 09/21/2013 18:23   Dg Lumbar Spine  Complete  09/21/2013   CLINICAL DATA:  Fall down stairs.  Back pain.  EXAM: LUMBAR SPINE - COMPLETE 4+ VIEW  COMPARISON:  07/07/2013  FINDINGS: Lumbar spondylosis noted. Hernia mesh projects over the lumbosacral junction.  Mild bilateral facet arthropathy noted at L5-S1 and S1-2 bilaterally. The S1 vertebra is segmental.  No fracture or subluxation observed.  IMPRESSION: 1. Lower lumbar spondylosis.  No acute bony findings. 2. Segmental  S1 vertebra.   Electronically Signed   By: Herbie Baltimore M.D.   On: 09/21/2013 18:24   Dg Femur Right  09/21/2013   CLINICAL DATA:  Fall down stairs.  Right leg pain.  EXAM: RIGHT FEMUR - 2 VIEW  COMPARISON:  None.  FINDINGS: There is no evidence of fracture or other focal bone lesions. Soft tissues are unremarkable.  IMPRESSION: Negative.   Electronically Signed   By: Herbie Baltimore M.D.   On: 09/21/2013 18:24   Ct Head Wo Contrast  09/21/2013   CLINICAL DATA:  Pain.  Fell down stairs on back.  EXAM: CT HEAD WITHOUT CONTRAST  CT CERVICAL SPINE WITHOUT CONTRAST  TECHNIQUE: Multidetector CT imaging of the head and cervical spine was performed following the standard protocol without intravenous contrast. Multiplanar CT image reconstructions of the cervical spine were also generated.  COMPARISON:  Head CT 07/26/2013  FINDINGS: CT HEAD FINDINGS  No acute intracranial abnormality is identified. Specifically, there is no hemorrhage, hydrocephalus, mass lesion, mass effect, or evidence of acute cortically based infarction. The skull is intact. The visualized paranasal sinuses and mastoid air cells are clear. Soft tissues of the scalp are symmetric. Negative for scalp hematoma.  CT CERVICAL SPINE FINDINGS  Cervical spine is imaged from the skullbase through the superior aspect T1. The vertebral bodies are normal in height and alignment. The disc spaces are preserved. Facet joints are aligned. Early anterior osteophyte formation at C5-6 and C3-4. Negative for cervical spine  fracture. The prevertebral soft tissue contour is normal.  IMPRESSION: 1. No acute intracranial abnormality. 2. No acute bony abnormality of the cervical spine.   Electronically Signed   By: Britta Mccreedy M.D.   On: 09/21/2013 17:50   Ct Chest Wo Contrast  09/21/2013   CLINICAL DATA:  Fall.  Abnormal thoracic spine radiograph  EXAM: CT CHEST WITHOUT CONTRAST  TECHNIQUE: Multidetector CT imaging of the chest was performed following the standard protocol without IV contrast.  COMPARISON:  None.  FINDINGS: No evidence of mediastinal hemorrhage. Normal appearance of the aorta. Coronary artery calcifications in the left anterior descending are noted.  No pericardial effusion. No abnormal mediastinal adenopathy. Mediastinal fat is prominent there is also prominent fatty tissue in the left paraspinal region likely the cause of the radiographic abnormality. Prominent pleural fat is also present at the lung bases.  No pneumothorax. No pleural effusion. Volume loss at the medial and basilar right middle lobe as well as the left lung base. Minimal focal bronchitic changes in the lingula on image 27. No acute bony deformity. No vertebral compression deformity.  Tiny calculus in the upper pole of the right kidney.  IMPRESSION: No acute intra thoracic injury.  Prominent mediastinal, pleural, and paraspinal fat.   Electronically Signed   By: Maryclare Bean M.D.   On: 09/21/2013 20:06   Ct Cervical Spine Wo Contrast  09/21/2013   CLINICAL DATA:  Pain.  Fell down stairs on back.  EXAM: CT HEAD WITHOUT CONTRAST  CT CERVICAL SPINE WITHOUT CONTRAST  TECHNIQUE: Multidetector CT imaging of the head and cervical spine was performed following the standard protocol without intravenous contrast. Multiplanar CT image reconstructions of the cervical spine were also generated.  COMPARISON:  Head CT 07/26/2013  FINDINGS: CT HEAD FINDINGS  No acute intracranial abnormality is identified. Specifically, there is no hemorrhage, hydrocephalus,  mass lesion, mass effect, or evidence of acute cortically based infarction. The skull is intact. The visualized paranasal sinuses and mastoid air cells are clear.  Soft tissues of the scalp are symmetric. Negative for scalp hematoma.  CT CERVICAL SPINE FINDINGS  Cervical spine is imaged from the skullbase through the superior aspect T1. The vertebral bodies are normal in height and alignment. The disc spaces are preserved. Facet joints are aligned. Early anterior osteophyte formation at C5-6 and C3-4. Negative for cervical spine fracture. The prevertebral soft tissue contour is normal.  IMPRESSION: 1. No acute intracranial abnormality. 2. No acute bony abnormality of the cervical spine.   Electronically Signed   By: Britta Mccreedy M.D.   On: 09/21/2013 17:50   Ct Thoracic Spine Wo Contrast  09/21/2013   CLINICAL DATA:  Abnormal radiograph  EXAM: CT THORACIC SPINE WITHOUT CONTRAST  TECHNIQUE: Multidetector CT imaging of the thoracic spine was performed without intravenous contrast administration. Multiplanar CT image reconstructions were also generated.  COMPARISON:  09/10/2003  FINDINGS: No acute thoracic spine fracture or dislocation. There is anatomic alignment. Mild diffuse spondylitic changes are noted. Tiny shallow right paracentral T8-T9 disk protrusion is stable.  There is abundant left paraspinal adipose tissue there is also prominent pleural fat and mediastinal fat. This is likely the cause of the soft tissue prominence along the left paraspinal location.  IMPRESSION: No acute bony injury or hematoma involving the thoracic spine. The radiographic abnormality is result of prominent left paraspinal adipose tissue.   Electronically Signed   By: Maryclare Bean M.D.   On: 09/21/2013 20:10     EKG Interpretation   None       MDM   1. Back pain    Persistent back pain secondary to fall on 09/21/13. Patient neurovascularly intact and ambulatory in ED without assistance. Denies new/recent trauma since  last visit on 12/2. No red flags or signs concerning for cauda equina. Patient well and nontoxic appearing, hemodynamically stable, and afebrile.   I have advised the patient today that will tx with PO narcotics for pain control; however, that this will be the last visit to the ED for this reason. I have explained that I do not believe prescribing narcotics in and ED setting is appropriate for chronic pain. I have advised patient to f/u with his PCP for this reason should symptoms persist and to request referral to pain management if indicated. Return precautions discussed with the patient who verbalizes understanding and comfort with this plan today. Patient stable for d/c without any unaddressed concerns.    Antony Madura, PA-C 09/30/13 2321

## 2013-09-30 NOTE — ED Notes (Signed)
Seen here for fall 11/30 and again 12/2.  Says  He was told to return for a recheck,  Low and mid back.  Alert,

## 2013-10-03 NOTE — ED Provider Notes (Signed)
Medical screening examination/treatment/procedure(s) were performed by non-physician practitioner and as supervising physician I was immediately available for consultation/collaboration.  EKG Interpretation   None          Christopher J. Pollina, MD 10/03/13 2130 

## 2013-10-07 MED FILL — Hydrocodone-Acetaminophen Tab 5-325 MG: ORAL | Qty: 6 | Status: AC

## 2014-12-01 ENCOUNTER — Observation Stay (HOSPITAL_COMMUNITY): Payer: Medicaid Other

## 2014-12-01 ENCOUNTER — Encounter (HOSPITAL_COMMUNITY): Payer: Self-pay | Admitting: *Deleted

## 2014-12-01 ENCOUNTER — Inpatient Hospital Stay (HOSPITAL_COMMUNITY)
Admission: EM | Admit: 2014-12-01 | Discharge: 2014-12-03 | DRG: 069 | Disposition: A | Discharge status: Home / Self Care | Payer: Medicaid Other | Attending: Internal Medicine | Admitting: Internal Medicine

## 2014-12-01 ENCOUNTER — Emergency Department (HOSPITAL_COMMUNITY): Payer: Medicaid Other

## 2014-12-01 DIAGNOSIS — Z9981 Dependence on supplemental oxygen: Secondary | ICD-10-CM

## 2014-12-01 DIAGNOSIS — K219 Gastro-esophageal reflux disease without esophagitis: Secondary | ICD-10-CM | POA: Diagnosis present

## 2014-12-01 DIAGNOSIS — E669 Obesity, unspecified: Secondary | ICD-10-CM | POA: Diagnosis present

## 2014-12-01 DIAGNOSIS — Z8701 Personal history of pneumonia (recurrent): Secondary | ICD-10-CM

## 2014-12-01 DIAGNOSIS — F418 Other specified anxiety disorders: Secondary | ICD-10-CM | POA: Diagnosis present

## 2014-12-01 DIAGNOSIS — R29898 Other symptoms and signs involving the musculoskeletal system: Secondary | ICD-10-CM

## 2014-12-01 DIAGNOSIS — F329 Major depressive disorder, single episode, unspecified: Secondary | ICD-10-CM | POA: Diagnosis present

## 2014-12-01 DIAGNOSIS — E119 Type 2 diabetes mellitus without complications: Secondary | ICD-10-CM | POA: Diagnosis present

## 2014-12-01 DIAGNOSIS — G4733 Obstructive sleep apnea (adult) (pediatric): Secondary | ICD-10-CM | POA: Diagnosis present

## 2014-12-01 DIAGNOSIS — H5442 Blindness, left eye, normal vision right eye: Secondary | ICD-10-CM | POA: Diagnosis present

## 2014-12-01 DIAGNOSIS — R531 Weakness: Secondary | ICD-10-CM | POA: Diagnosis present

## 2014-12-01 DIAGNOSIS — Z09 Encounter for follow-up examination after completed treatment for conditions other than malignant neoplasm: Secondary | ICD-10-CM

## 2014-12-01 DIAGNOSIS — Z6838 Body mass index (BMI) 38.0-38.9, adult: Secondary | ICD-10-CM

## 2014-12-01 DIAGNOSIS — G459 Transient cerebral ischemic attack, unspecified: Principal | ICD-10-CM | POA: Diagnosis present

## 2014-12-01 DIAGNOSIS — Z7982 Long term (current) use of aspirin: Secondary | ICD-10-CM

## 2014-12-01 DIAGNOSIS — H532 Diplopia: Secondary | ICD-10-CM | POA: Diagnosis present

## 2014-12-01 DIAGNOSIS — I10 Essential (primary) hypertension: Secondary | ICD-10-CM | POA: Diagnosis present

## 2014-12-01 DIAGNOSIS — F32A Depression, unspecified: Secondary | ICD-10-CM | POA: Diagnosis present

## 2014-12-01 DIAGNOSIS — R269 Unspecified abnormalities of gait and mobility: Secondary | ICD-10-CM

## 2014-12-01 DIAGNOSIS — F0390 Unspecified dementia without behavioral disturbance: Secondary | ICD-10-CM | POA: Diagnosis present

## 2014-12-01 HISTORY — DX: Anxiety disorder, unspecified: F41.9

## 2014-12-01 LAB — URINALYSIS, ROUTINE W REFLEX MICROSCOPIC
BILIRUBIN URINE: NEGATIVE
GLUCOSE, UA: 250 mg/dL — AB
HGB URINE DIPSTICK: NEGATIVE
KETONES UR: NEGATIVE mg/dL
Leukocytes, UA: NEGATIVE
Nitrite: NEGATIVE
PH: 6 (ref 5.0–8.0)
Protein, ur: NEGATIVE mg/dL
Specific Gravity, Urine: 1.01 (ref 1.005–1.030)
Urobilinogen, UA: 0.2 mg/dL (ref 0.0–1.0)

## 2014-12-01 LAB — GLUCOSE, CAPILLARY
Glucose-Capillary: 138 mg/dL — ABNORMAL HIGH (ref 70–99)
Glucose-Capillary: 143 mg/dL — ABNORMAL HIGH (ref 70–99)

## 2014-12-01 LAB — CBC WITH DIFFERENTIAL/PLATELET
Basophils Absolute: 0 10*3/uL (ref 0.0–0.1)
Basophils Relative: 1 % (ref 0–1)
EOS ABS: 0.2 10*3/uL (ref 0.0–0.7)
Eosinophils Relative: 2 % (ref 0–5)
HCT: 44.5 % (ref 39.0–52.0)
Hemoglobin: 15.8 g/dL (ref 13.0–17.0)
Lymphocytes Relative: 25 % (ref 12–46)
Lymphs Abs: 1.8 10*3/uL (ref 0.7–4.0)
MCH: 30.6 pg (ref 26.0–34.0)
MCHC: 35.5 g/dL (ref 30.0–36.0)
MCV: 86.1 fL (ref 78.0–100.0)
MONO ABS: 0.5 10*3/uL (ref 0.1–1.0)
Monocytes Relative: 8 % (ref 3–12)
Neutro Abs: 4.5 10*3/uL (ref 1.7–7.7)
Neutrophils Relative %: 64 % (ref 43–77)
PLATELETS: 201 10*3/uL (ref 150–400)
RBC: 5.17 MIL/uL (ref 4.22–5.81)
RDW: 13.5 % (ref 11.5–15.5)
WBC: 7 10*3/uL (ref 4.0–10.5)

## 2014-12-01 LAB — I-STAT CHEM 8, ED
BUN: 8 mg/dL (ref 6–23)
CALCIUM ION: 1.21 mmol/L (ref 1.13–1.30)
CHLORIDE: 101 mmol/L (ref 96–112)
Creatinine, Ser: 0.9 mg/dL (ref 0.50–1.35)
GLUCOSE: 215 mg/dL — AB (ref 70–99)
HCT: 50 % (ref 39.0–52.0)
Hemoglobin: 17 g/dL (ref 13.0–17.0)
Potassium: 3.6 mmol/L (ref 3.5–5.1)
SODIUM: 141 mmol/L (ref 135–145)
TCO2: 25 mmol/L (ref 0–100)

## 2014-12-01 LAB — COMPREHENSIVE METABOLIC PANEL
ALK PHOS: 55 U/L (ref 39–117)
ALT: 46 U/L (ref 0–53)
AST: 33 U/L (ref 0–37)
Albumin: 4.1 g/dL (ref 3.5–5.2)
Anion gap: 4 — ABNORMAL LOW (ref 5–15)
BUN: 9 mg/dL (ref 6–23)
CO2: 30 mmol/L (ref 19–32)
Calcium: 9 mg/dL (ref 8.4–10.5)
Chloride: 103 mmol/L (ref 96–112)
Creatinine, Ser: 1.12 mg/dL (ref 0.50–1.35)
GFR calc Af Amer: 80 mL/min — ABNORMAL LOW (ref >90)
GFR calc non Af Amer: 69 mL/min — ABNORMAL LOW (ref >90)
GLUCOSE: 226 mg/dL — AB (ref 70–99)
POTASSIUM: 3.6 mmol/L (ref 3.5–5.1)
SODIUM: 137 mmol/L (ref 135–145)
TOTAL PROTEIN: 7.3 g/dL (ref 6.0–8.3)
Total Bilirubin: 2.2 mg/dL — ABNORMAL HIGH (ref 0.3–1.2)

## 2014-12-01 LAB — I-STAT TROPONIN, ED: Troponin i, poc: 0 ng/mL (ref 0.00–0.08)

## 2014-12-01 LAB — RAPID URINE DRUG SCREEN, HOSP PERFORMED
AMPHETAMINES: NOT DETECTED
Barbiturates: NOT DETECTED
Benzodiazepines: NOT DETECTED
Cocaine: NOT DETECTED
Opiates: NOT DETECTED
TETRAHYDROCANNABINOL: NOT DETECTED

## 2014-12-01 LAB — ETHANOL: Alcohol, Ethyl (B): <5 mg/dL (ref 0–9)

## 2014-12-01 LAB — APTT: aPTT: 28 seconds (ref 24–37)

## 2014-12-01 LAB — PROTIME-INR
INR: 1.01 (ref 0.00–1.49)
Prothrombin Time: 13.4 seconds (ref 11.6–15.2)

## 2014-12-01 MED ORDER — ENOXAPARIN SODIUM 40 MG/0.4ML ~~LOC~~ SOLN
40.0000 mg | SUBCUTANEOUS | Status: DC
  Administered 2014-12-01 – 2014-12-02 (×2): 40 mg via SUBCUTANEOUS
  Filled 2014-12-01 (×2): qty 0.4

## 2014-12-01 MED ORDER — ALPRAZOLAM 0.5 MG PO TABS
0.5000 mg | ORAL_TABLET | Freq: Two times a day (BID) | ORAL | Status: DC | PRN
  Administered 2014-12-01 – 2014-12-02 (×2): 0.5 mg via ORAL
  Filled 2014-12-01 (×2): qty 1

## 2014-12-01 MED ORDER — ACETAMINOPHEN 325 MG PO TABS: 650.0000 mg | ORAL_TABLET | ORAL | Status: DC | PRN

## 2014-12-01 MED ORDER — TRAMADOL HCL 50 MG PO TABS
50.0000 mg | ORAL_TABLET | Freq: Four times a day (QID) | ORAL | Status: DC | PRN
  Administered 2014-12-01 – 2014-12-03 (×3): 50 mg via ORAL
  Filled 2014-12-01 (×3): qty 1

## 2014-12-01 MED ORDER — METOPROLOL SUCCINATE ER 50 MG PO TB24
100.0000 mg | ORAL_TABLET | Freq: Every day | ORAL | Status: DC
  Administered 2014-12-02 – 2014-12-03 (×2): 100 mg via ORAL
  Filled 2014-12-01 (×3): qty 2

## 2014-12-01 MED ORDER — GABAPENTIN 300 MG PO CAPS
300.0000 mg | ORAL_CAPSULE | Freq: Three times a day (TID) | ORAL | Status: DC
  Administered 2014-12-01 – 2014-12-03 (×6): 300 mg via ORAL
  Filled 2014-12-01 (×6): qty 1

## 2014-12-01 MED ORDER — NITROGLYCERIN 0.4 MG SL SUBL: 0.4000 mg | SUBLINGUAL_TABLET | SUBLINGUAL | Status: DC | PRN

## 2014-12-01 MED ORDER — ASPIRIN 81 MG PO CHEW
324.0000 mg | CHEWABLE_TABLET | Freq: Once | ORAL | Status: AC
  Administered 2014-12-01: 324 mg via ORAL
  Filled 2014-12-01: qty 4

## 2014-12-01 MED ORDER — SENNOSIDES-DOCUSATE SODIUM 8.6-50 MG PO TABS: 1.0000 | ORAL_TABLET | Freq: Every evening | ORAL | Status: DC | PRN

## 2014-12-01 MED ORDER — GABAPENTIN 600 MG PO TABS
300.0000 mg | ORAL_TABLET | Freq: Three times a day (TID) | ORAL | Status: DC
  Filled 2014-12-01 (×3): qty 0.5

## 2014-12-01 MED ORDER — INSULIN ASPART 100 UNIT/ML ~~LOC~~ SOLN
0.0000 [IU] | Freq: Three times a day (TID) | SUBCUTANEOUS | Status: DC
  Administered 2014-12-01 – 2014-12-02 (×2): 1 [IU] via SUBCUTANEOUS
  Administered 2014-12-02 (×2): 2 [IU] via SUBCUTANEOUS
  Administered 2014-12-03: 3 [IU] via SUBCUTANEOUS
  Administered 2014-12-03: 1 [IU] via SUBCUTANEOUS

## 2014-12-01 MED ORDER — INSULIN ASPART 100 UNIT/ML ~~LOC~~ SOLN
0.0000 [IU] | Freq: Every day | SUBCUTANEOUS | Status: DC
Start: 2014-12-01 — End: 2014-12-03

## 2014-12-01 MED ORDER — STROKE: EARLY STAGES OF RECOVERY BOOK
Freq: Once | Status: AC
  Administered 2014-12-01: 17:00:00
  Filled 2014-12-01: qty 1

## 2014-12-01 MED ORDER — ASPIRIN 325 MG PO TABS
325.0000 mg | ORAL_TABLET | Freq: Every day | ORAL | Status: DC
  Administered 2014-12-02 – 2014-12-03 (×2): 325 mg via ORAL
  Filled 2014-12-01 (×2): qty 1

## 2014-12-01 NOTE — ED Notes (Signed)
Pt comes in by EMS with left sided weakness and slurred speech that started last night at 9:00. Pt states his left hand is swollen and his lower extremities are swollen bilaterally. Pt is having no difficulty breathing. NAD noted. Pt is alert and oriented.   CBG 300, per EMS

## 2014-12-01 NOTE — ED Notes (Addendum)
Upon nuero re-assessment, pt was talking with nurse and freely using left arm. When completing assessment, pt states he is unable to move his arm fully and would score a 1 on the [facility] left arm assessment.

## 2014-12-01 NOTE — ED Provider Notes (Signed)
CSN: [id number]     Arrival date & time 12/01/14  1013 History  This chart was scribed for Orpah Greek, MD by Edison Simon, ED Scribe. This patient was seen in room APA08/[facility]08 and the patient's care was started at 10:36 AM.    Chief Complaint  Patient presents with  . Code Stroke   The history is provided by the patient. No language interpreter was used.    HPI Comments: [name] [name] is a 64 y.o. male who presents to the Emergency Department complaining of left-sided facial droop and slurred speech with onset at 2100 last night. He states his left hand is swollen and legs are swollen. When asked if he has weakness in his extremities, he responds "I don't know."  Patient does report that he fell earlier today. When he woke up this morning he was having pain in the right thigh. He reports that he had some trouble walking because of this pain and this caused the fall. No additional injury from the fall.  Past Medical History  Diagnosis Date  . Hypertension   . Borderline diabetes mellitus   . 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 GERD (gastroesophageal reflux disease)   . Obstructive sleep apnea on CPAP   . Depression with anxiety    Past Surgical History  Procedure Laterality Date  . Umbilical hernia repair    . Tonsillectomy    . Rectal surgery      As an infant   Family History  Problem Relation Age of Onset  . Dementia Other   . Alzheimer's disease Other   . Diabetes Other   . Coronary artery disease Other   . Heart attack Other   . Cancer Other   . Lung cancer Other   . Sudden death Sister   . Colon cancer Neg Hx    History  Substance Use Topics  . Smoking status: Never Smoker   . Smokeless tobacco: Never Used  . Alcohol Use: No    Review of Systems  Cardiovascular: Positive for leg swelling.  Neurological: Positive for facial asymmetry and speech difficulty.  All other systems reviewed and are negative.     Allergies  Review of  patient's allergies indicates no known allergies.  Home Medications   Prior to Admission medications   Medication Sig Start Date End Date Taking? Authorizing Provider  ALPRAZolam Duanne Moron) 0.5 MG tablet Take 0.5 mg by mouth 2 (two) times daily as needed. Nerves/stress 11/06/14  Yes Historical Provider, MD  aspirin EC 81 MG tablet Take 81 mg by mouth every morning.   Yes Historical Provider, MD  furosemide (LASIX) 20 MG tablet Take 20 mg by mouth daily. 11/04/14  Yes Historical Provider, MD  hydrochlorothiazide (HYDRODIURIL) 25 MG tablet Take 25 mg by mouth daily.   Yes Historical Provider, MD  lisinopril (PRINIVIL,ZESTRIL) 20 MG tablet Take 20 mg by mouth daily. 11/04/14  Yes Historical Provider, MD  metFORMIN (GLUCOPHAGE-XR) 500 MG 24 hr tablet Take 1,000 mg by mouth daily.  11/04/14  Yes Historical Provider, MD  metoprolol succinate (TOPROL-XL) 100 MG 24 hr tablet Take 100 mg by mouth daily. 11/04/14  Yes Historical Provider, MD  potassium chloride (K-DUR,KLOR-CON) 10 MEQ tablet Take 10 mEq by mouth 2 (two) times daily. 11/04/14  Yes Historical Provider, MD  triamcinolone cream (KENALOG) 0.1 % Apply 1 application topically 2 (two) times daily.   Yes Historical Provider, MD  cephALEXin (KEFLEX) 500 MG capsule  Take 1 capsule (500 mg total) by mouth 4 (four) times daily. Patient not taking: Reported on 12/01/2014 09/30/13   Antonietta Breach, PA-C  HYDROcodone-acetaminophen (NORCO/VICODIN) 5-325 MG per tablet Take 1 tablet by mouth every 4 (four) hours as needed. Patient not taking: Reported on 12/01/2014 09/30/13   Antonietta Breach, PA-C  HYDROcodone-acetaminophen (NORCO/VICODIN) 5-325 MG per tablet Take 1 tablet by mouth every 4 (four) hours as needed. Patient not taking: Reported on 12/01/2014 09/30/13   Antonietta Breach, PA-C  nitroGLYCERIN (NITROSTAT) 0.4 MG SL tablet Place 0.4 mg under the tongue every 5 (five) minutes as needed for chest pain.    Historical Provider, MD  sulfamethoxazole-trimethoprim (SEPTRA DS) 800-160  MG per tablet Take 1 tablet by mouth every 12 (twelve) hours. Patient not taking: Reported on 12/01/2014 09/23/13   Nat Christen, MD   BP 162/94 mmHg  Pulse 75  Resp 14  SpO2 100% Physical Exam  Constitutional: He is oriented to person, place, and time. He appears well-developed and well-nourished. No distress.  HENT:  Head: Normocephalic and atraumatic.  Right Ear: Hearing normal.  Left Ear: Hearing normal.  Nose: Nose normal.  Mouth/Throat: Oropharynx is clear and moist and mucous membranes are normal.  Eyes: Conjunctivae and EOM are normal. Pupils are equal, round, and reactive to light.  Neck: Normal range of motion. Neck supple.  Cardiovascular: Regular rhythm, S1 normal and S2 normal.  Exam reveals no gallop and no friction rub.   No murmur heard. Pulmonary/Chest: Effort normal and breath sounds normal. No respiratory distress. He exhibits no tenderness.  Abdominal: Soft. Normal appearance and bowel sounds are normal. There is no hepatosplenomegaly. There is no tenderness. There is no rebound, no guarding, no tenderness at McBurney's point and negative Murphy's sign. No hernia.  Musculoskeletal: Normal range of motion. He exhibits edema (bilateral pitting edema in legs).  Neurological: He is alert and oriented to person, place, and time. He has normal strength. No cranial nerve deficit or sensory deficit. Coordination normal. GCS eye subscore is 4. GCS verbal subscore is 5. GCS motor subscore is 6.  left facial droop Almost no effort at all in all extremities  Skin: Skin is warm, dry and intact. No rash noted. No cyanosis.  Psychiatric: He has a normal mood and affect. His speech is normal and behavior is normal. Thought content normal.  Nursing note and vitals reviewed.   ED Course  Procedures (including critical care time)  DIAGNOSTIC STUDIES: Oxygen Saturation is 100% on room air, normal by my interpretation.    COORDINATION OF CARE: 10:38 AM Discussed treatment plan with  patient at beside, the patient agrees with the plan and has no further questions at this time.   Labs Review Labs Reviewed  COMPREHENSIVE METABOLIC PANEL - Abnormal; Notable for the following:    Glucose, Bld 226 (*)    Total Bilirubin 2.2 (*)    GFR calc non Af Amer 69 (*)    GFR calc Af Amer 80 (*)    Anion gap 4 (*)    All other components within normal limits  URINALYSIS, ROUTINE W REFLEX MICROSCOPIC - Abnormal; Notable for the following:    Glucose, UA 250 (*)    All other components within normal limits  I-STAT CHEM 8, ED - Abnormal; Notable for the following:    Glucose, Bld 215 (*)    All other components within normal limits  CBC WITH DIFFERENTIAL/PLATELET  ETHANOL  PROTIME-INR  APTT  URINE RAPID DRUG SCREEN (HOSP PERFORMED)  Randolm Idol, ED  Randolm Idol, ED    Imaging Review Ct Head Wo Contrast  12/01/2014   CLINICAL DATA:  64 year old male with slurred speech and left side weakness since 2100 hours. Dense weakness of the left face. Code stroke. Initial encounter.  EXAM: CT HEAD WITHOUT CONTRAST  TECHNIQUE: Contiguous axial images were obtained from the base of the skull through the vertex without intravenous contrast.  COMPARISON:  09/21/2013 and earlier.  FINDINGS: Stable paranasal sinuses and mastoids. No acute osseous abnormality identified. Negative orbit and scalp soft tissues.  Cerebral volume is stable and within normal limits for age. No ventriculomegaly. No midline shift, mass effect, or evidence of intracranial mass lesion. No evidence of cortically based acute infarction identified. Dominant distal left vertebral artery again suspected. No suspicious intracranial vascular hyperdensity. No acute intracranial hemorrhage identified.  IMPRESSION: Stable and normal for age noncontrast CT appearance of the brain.  Study discussed by telephone with Dr. Joseph Berkshire on 12/01/2014 at 12:01 .   Electronically Signed   By: Genevie Ann M.D.   On: 12/01/2014 12:02      EKG Interpretation   Date/Time:  Tuesday December 01 2014 10:24:59 EST Ventricular Rate:  76 PR Interval:  142 QRS Duration: 89 QT Interval:  366 QTC Calculation: 411 R Axis:   45 Text Interpretation:  Sinus rhythm Normal ECG Confirmed by Chrissie Dacquisto  MD,  Sarai January ([id number]) on 12/01/2014 10:43:03 AM      MDM   Final diagnoses:  None   TIA  Patient presented to the ER with multiple complaints. Patient reportedly started having numbness and weakness of the left side of his face and difficulty with speech last night. He reports that he fell this morning because he was having trouble walking. He was unable to walk well this morning and did have a fall.  Upon arrival to the ER, patient is noted to have a dense left facial droop. No upper motor neuron involvement. He appeared slightly confused and had difficulty cooperating with exam. He had profound lower extremity weakness that appeared to be symmetrical on examination. He gave poor effort with moving arms and grip strength bilaterally as well.  Initial workup was unremarkable. CT scan was negative for acute findings. During the period of evaluation here in the ER, symptoms have improved. Patient still reports numbness on the left side of his face, but the droop has completely resolved. He did, however, have some episodes of having difficulty getting words out while being reevaluated.  Will recommend observation in the hospital for CVA/TIA workup.  I personally performed the services described in this documentation, which was scribed in my presence. The recorded information has been reviewed and is accurate.     Orpah Greek, MD 12/01/14 [phone]

## 2014-12-01 NOTE — ED Notes (Signed)
MD at bedside. 

## 2014-12-01 NOTE — Progress Notes (Signed)
Patient very depressed during admission history and assessment. Patient c/o pain. Order for spiritual consult placed. Dr. Karleen Hampshire notified.

## 2014-12-01 NOTE — ED Notes (Signed)
Pt called for nurse and upon entering the room, the patient was standing at bedside with no assistance. Pt was walking in the direction of the bathroom. Pt had no difficulties ambulating and was moving legs despite earlier neuro evaluation when patient was unable to move his legs.

## 2014-12-01 NOTE — H&P (Signed)
[facility] History and Physical  HOWELL GROESBECK ZGY:[id number] DOB: 1951/06/30 DOA: 12/01/2014  Referring physician: EDP PCP: Deloria Lair, MD   Chief Complaint: weakness since last night.  HPI: [name] [name] is a 64 y.o. male  With prior h/o hypertension, DM, comes in for left sided facial droop and slurred speech associated with left sided facial numbness and right sided weakness all started last night and persisted till he came to ED, . As per EDP, the facial droop improved after a few hours. Currently he doesn't have any symptoms. He was referred to medical service for admission for TIA. 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 He also reports some pain in the right lower extremity. He denies any other complaints. He denies chest pain, sob, dizziness, syncope, nausea, vomiting and diarrhea. He will admitted to medical service for evaluation of TIA. His CT and mri brain are negative for acute stroke and he was started on aspirin 325 mg daily.   Review of Systems:  See HPI.   Past Medical History  Diagnosis Date  . Hypertension   . Borderline diabetes mellitus   . 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 GERD (gastroesophageal reflux disease)   . Obstructive sleep apnea on CPAP   . Depression with anxiety    Past Surgical History  Procedure Laterality Date  . Umbilical hernia repair    . Tonsillectomy    . Rectal surgery      As an infant   Social History:  reports that he has never smoked. He has never used smokeless tobacco. He reports that he does not drink alcohol or use illicit drugs.  No Known Allergies  Family History  Problem Relation Age of Onset  . Dementia Other   . Alzheimer's disease Other   . Diabetes Other   . Coronary artery disease Other   . Heart attack Other   . Cancer Other   . Lung cancer Other   . Sudden death Sister   .  Colon cancer Neg Hx     Prior to Admission medications   Medication Sig Start Date End Date Taking? Authorizing Provider  ALPRAZolam Duanne Moron) 0.5 MG tablet Take 0.5 mg by mouth 2 (two) times daily as needed. Nerves/stress 11/06/14  Yes Historical Provider, MD  aspirin EC 81 MG tablet Take 81 mg by mouth every morning.   Yes Historical Provider, MD  furosemide (LASIX) 20 MG tablet Take 20 mg by mouth daily. 11/04/14  Yes Historical Provider, MD  hydrochlorothiazide (HYDRODIURIL) 25 MG tablet Take 25 mg by mouth daily.   Yes Historical Provider, MD  lisinopril (PRINIVIL,ZESTRIL) 20 MG tablet Take 20 mg by mouth daily. 11/04/14  Yes Historical Provider, MD  metFORMIN (GLUCOPHAGE-XR) 500 MG 24 hr tablet Take 1,000 mg by mouth daily.  11/04/14  Yes Historical Provider, MD  metoprolol succinate (TOPROL-XL) 100 MG 24 hr tablet Take 100 mg by mouth daily. 11/04/14  Yes Historical Provider, MD  potassium chloride (K-DUR,KLOR-CON) 10 MEQ tablet Take 10 mEq by mouth 2 (two) times daily. 11/04/14  Yes Historical Provider, MD  triamcinolone cream (KENALOG) 0.1 % Apply 1 application topically 2 (two) times daily.   Yes Historical Provider, MD  nitroGLYCERIN (NITROSTAT) 0.4 MG SL tablet Place 0.4 mg under the tongue every 5 (five) minutes as needed for chest pain.    Historical Provider,  MD   Physical Exam: Filed Vitals:   12/01/14 1227 12/01/14 1311 12/01/14 1330 12/01/14 1430  BP: 167/81 162/94 164/93 146/86  Pulse: 72 75 75 71  Resp: 11 14  14   SpO2: 100% 100% 100% 100%    Wt Readings from Last 3 Encounters:  09/30/13 91.627 kg (202 lb)  09/23/13 91.627 kg (202 lb)  03/23/10 100.245 kg (221 lb)    General:  Appears calm and comfortable Eyes: PERRL,  Left eye blind.  Neck: no LAD, masses or thyromegaly Cardiovascular: RRR, no m/r/g. No LE edema. Respiratory: CTA bilaterally, no w/r/r. Normal respiratory effort. Abdomen: soft, ntnd Skin: no rash or induration seen on limited exam Musculoskeletal:  grossly normal tone BUE/BLE Psychiatric: grossly normal mood and affect, speech fluent and appropriate Neurologic:  Facial droop very slight, speech normal. No focal deficits. No gait problems.           Labs on Admission:  Basic Metabolic Panel:  Recent Labs Lab 12/01/14 1035 12/01/14 1100  NA 137 141  K 3.6 3.6  CL 103 101  CO2 30  --   GLUCOSE 226* 215*  BUN 9 8  CREATININE 1.12 0.90  CALCIUM 9.0  --    Liver Function Tests:  Recent Labs Lab 12/01/14 1035  AST 33  ALT 46  ALKPHOS 55  BILITOT 2.2*  PROT 7.3  ALBUMIN 4.1   No results for input(s): LIPASE, AMYLASE in the last 168 hours. No results for input(s): AMMONIA in the last 168 hours. CBC:  Recent Labs Lab 12/01/14 1035 12/01/14 1100  WBC 7.0  --   NEUTROABS 4.5  --   HGB 15.8 17.0  HCT 44.5 50.0  MCV 86.1  --   PLT 201  --    Cardiac Enzymes: No results for input(s): CKTOTAL, CKMB, CKMBINDEX, TROPONINI in the last 168 hours.  BNP (last 3 results) No results for input(s): BNP in the last 8760 hours.  ProBNP (last 3 results) No results for input(s): PROBNP in the last 8760 hours.  CBG: No results for input(s): GLUCAP in the last 168 hours.  Radiological Exams on Admission: Ct Head Wo Contrast  12/01/2014   CLINICAL DATA:  64 year old male with slurred speech and left side weakness since 2100 hours. Dense weakness of the left face. Code stroke. Initial encounter.  EXAM: CT HEAD WITHOUT CONTRAST  TECHNIQUE: Contiguous axial images were obtained from the base of the skull through the vertex without intravenous contrast.  COMPARISON:  09/21/2013 and earlier.  FINDINGS: Stable paranasal sinuses and mastoids. No acute osseous abnormality identified. Negative orbit and scalp soft tissues.  Cerebral volume is stable and within normal limits for age. No ventriculomegaly. No midline shift, mass effect, or evidence of intracranial mass lesion. No evidence of cortically based acute infarction identified.  Dominant distal left vertebral artery again suspected. No suspicious intracranial vascular hyperdensity. No acute intracranial hemorrhage identified.  IMPRESSION: Stable and normal for age noncontrast CT appearance of the brain.  Study discussed by telephone with Dr. Joseph Berkshire on 12/01/2014 at 12:01 .   Electronically Signed   By: Genevie Ann M.D.   On: 12/01/2014 12:02    EKG: sinus Assessment/Plan Active Problems:   TIA (transient ischemic attack)    ?left facial droop/ left facial numbness and right lower extremity weakness: Admitted for evalution of TIA.  Stroke work up in progress .  Neurology consulted .  Started him on aspirin 325 mg daily.    Diabetes Mellitus: hgba1c and SSI  Hypertension: Permissive hypertension      Code Status: full code.  DVT Prophylaxis: Family Communication: none at bedside Disposition Plan: admit for observation  Time spent: 76 min  [facility] Pager [phone]

## 2014-12-01 NOTE — ED Notes (Signed)
Admitting nurse made aware that pt will come upstairs when he gets back from MRI.

## 2014-12-02 ENCOUNTER — Observation Stay (HOSPITAL_COMMUNITY): Payer: Medicaid Other

## 2014-12-02 ENCOUNTER — Observation Stay (HOSPITAL_COMMUNITY)
Admission: EM | Admit: 2014-12-02 | Discharge: 2014-12-02 | Disposition: A | Discharge status: Home / Self Care | Payer: Medicaid Other | Source: Home / Self Care | Attending: Neurology | Admitting: Neurology

## 2014-12-02 DIAGNOSIS — I10 Essential (primary) hypertension: Secondary | ICD-10-CM

## 2014-12-02 DIAGNOSIS — Z6838 Body mass index (BMI) 38.0-38.9, adult: Secondary | ICD-10-CM | POA: Diagnosis not present

## 2014-12-02 DIAGNOSIS — H532 Diplopia: Secondary | ICD-10-CM | POA: Diagnosis present

## 2014-12-02 DIAGNOSIS — K219 Gastro-esophageal reflux disease without esophagitis: Secondary | ICD-10-CM

## 2014-12-02 DIAGNOSIS — F32A Depression, unspecified: Secondary | ICD-10-CM | POA: Diagnosis present

## 2014-12-02 DIAGNOSIS — E669 Obesity, unspecified: Secondary | ICD-10-CM | POA: Diagnosis present

## 2014-12-02 DIAGNOSIS — R269 Unspecified abnormalities of gait and mobility: Secondary | ICD-10-CM

## 2014-12-02 DIAGNOSIS — F418 Other specified anxiety disorders: Secondary | ICD-10-CM | POA: Diagnosis present

## 2014-12-02 DIAGNOSIS — Z9981 Dependence on supplemental oxygen: Secondary | ICD-10-CM | POA: Diagnosis not present

## 2014-12-02 DIAGNOSIS — Z7982 Long term (current) use of aspirin: Secondary | ICD-10-CM | POA: Diagnosis not present

## 2014-12-02 DIAGNOSIS — Z8701 Personal history of pneumonia (recurrent): Secondary | ICD-10-CM | POA: Diagnosis not present

## 2014-12-02 DIAGNOSIS — H5442 Blindness, left eye, normal vision right eye: Secondary | ICD-10-CM | POA: Diagnosis present

## 2014-12-02 DIAGNOSIS — M6289 Other specified disorders of muscle: Secondary | ICD-10-CM

## 2014-12-02 DIAGNOSIS — E119 Type 2 diabetes mellitus without complications: Secondary | ICD-10-CM | POA: Diagnosis present

## 2014-12-02 DIAGNOSIS — F0390 Unspecified dementia without behavioral disturbance: Secondary | ICD-10-CM | POA: Diagnosis present

## 2014-12-02 DIAGNOSIS — G459 Transient cerebral ischemic attack, unspecified: Secondary | ICD-10-CM | POA: Diagnosis not present

## 2014-12-02 DIAGNOSIS — R531 Weakness: Secondary | ICD-10-CM | POA: Diagnosis not present

## 2014-12-02 DIAGNOSIS — G4733 Obstructive sleep apnea (adult) (pediatric): Secondary | ICD-10-CM | POA: Diagnosis present

## 2014-12-02 DIAGNOSIS — F329 Major depressive disorder, single episode, unspecified: Secondary | ICD-10-CM | POA: Diagnosis present

## 2014-12-02 LAB — LIPID PANEL
Cholesterol: 132 mg/dL (ref 0–200)
HDL: 22 mg/dL — ABNORMAL LOW (ref >39)
LDL Cholesterol: 64 mg/dL (ref 0–99)
TRIGLYCERIDES: 229 mg/dL — AB (ref <150)
Total CHOL/HDL Ratio: 6 RATIO
VLDL: 46 mg/dL — ABNORMAL HIGH (ref 0–40)

## 2014-12-02 LAB — GLUCOSE, CAPILLARY
GLUCOSE-CAPILLARY: 168 mg/dL — AB (ref 70–99)
Glucose-Capillary: 150 mg/dL — ABNORMAL HIGH (ref 70–99)
Glucose-Capillary: 165 mg/dL — ABNORMAL HIGH (ref 70–99)
Glucose-Capillary: 171 mg/dL — ABNORMAL HIGH (ref 70–99)

## 2014-12-02 LAB — CK TOTAL AND CKMB (NOT AT ARMC)
CK, MB: 2.7 ng/mL (ref 0.3–4.0)
RELATIVE INDEX: INVALID (ref 0.0–2.5)
Total CK: 70 U/L (ref 7–232)

## 2014-12-02 LAB — TSH: TSH: 2.746 u[IU]/mL (ref 0.350–4.500)

## 2014-12-02 MED ORDER — PANTOPRAZOLE SODIUM 40 MG PO TBEC
40.0000 mg | DELAYED_RELEASE_TABLET | Freq: Every day | ORAL | Status: DC
  Administered 2014-12-03: 40 mg via ORAL
  Filled 2014-12-02: qty 1

## 2014-12-02 MED ORDER — LISINOPRIL 10 MG PO TABS
20.0000 mg | ORAL_TABLET | Freq: Every day | ORAL | Status: DC
  Administered 2014-12-02 – 2014-12-03 (×2): 20 mg via ORAL
  Filled 2014-12-02 (×2): qty 2

## 2014-12-02 NOTE — Evaluation (Signed)
Physical Therapy Evaluation Patient Details Name: [name] [name] MRN: [id number] DOB: 1950-12-08 Today's Date: 12/02/2014   History of Present Illness  h/o hypertension, DM, comes in for left sided facial droop and slurred speech associated with left sided facial numbness and right sided weakness all started last night and persisted till he came to ED, . As per EDP, the facial droop improved after a few hours.  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 He also reports some pain in the right lower extremity. He will admitted to medical service for evaluation of TIA. His CT and mri brain are negative for acute stroke  Clinical Impression  Patient display difficulty walking secondary to decreased balance while sitting and standing. Patient is at high falls risk as his skin becomes cold and clammy with prolonged standing >17minutes. Patient previously seen by nursing walking in room without assistance but displayed increased need for assistance during this visit. Recommending SNF for strenghtening and gait training.    Follow Up Recommendations SNF    Equipment Recommendations  None recommended by PT    Recommendations for Other Services       Precautions / Restrictions Precautions Precautions: Fall Restrictions Weight Bearing Restrictions: No      Mobility  Bed Mobility Overal bed mobility: Needs Assistance Bed Mobility: Supine to Sit;Sit to Supine     Supine to sit: HOB elevated;Min assist Sit to supine: Modified independent (Device/Increase time)      Transfers Overall transfer level: Needs assistance Equipment used: Rolling walker (2 wheeled) Transfers: Sit to/from Stand Sit to Stand: Mod assist            Ambulation/Gait Ambulation/Gait assistance: Min assist Ambulation Distance (Feet): 35 Feet Assistive device: Rolling walker (2 wheeled) Gait  Pattern/deviations: Decreased stride length   Gait velocity interpretation: <1.8 ft/sec, indicative of risk for recurrent falls    Stairs            Wheelchair Mobility    Modified Rankin (Stroke Patients Only)       Balance Overall balance assessment: Needs assistance   Sitting balance-Leahy Scale: Poor Sitting balance - Comments: Pate=ient needs UE assistance to maintain sitting.  Postural control: Posterior lean   Standing balance-Leahy Scale: Poor Standing balance comment: therapist provided min assist and bilateral UE assist with bilateral HHA                             Pertinent Vitals/Pain Pain Assessment: 0-10 Pain Score: 9  Pain Location: Rt anterior Leg Pain Descriptors / Indicators: Stabbing;Throbbing;Constant Pain Intervention(s):  (pain medications have previously helped)    Home Living Family/patient expects to be discharged to:: Private residence Living Arrangements: Spouse/significant other     Home Access: Level entry       Home Equipment: Shower seat;Grab bars - tub/shower;Grab bars - toilet Additional Comments: Wife is a amputee due to DM and has a caregiver that helps her 2 hours a day. pt reports previous back issues. Patient had assistance with providing care for his wife.     Prior Function Level of Independence: Independent         Comments: Retired      Engineer, [employer]   Dominant Hand: Right    Extremity/Trunk Assessment               Lower Extremity Assessment: Defer to PT  evaluation RLE Deficits / Details: generalized weakness, Rt Anterior thigh pain and Lt gastroc involuntary contracted    Cervical / Trunk Assessment: Normal  Communication   Communication: No difficulties  Cognition Arousal/Alertness: Awake/alert Behavior During Therapy: WFL for tasks assessed/performed Overall Cognitive Status: Within Functional Limits for tasks assessed                      General Comments       Exercises Total Joint Exercises Ankle Circles/Pumps: AROM;20 reps Quad Sets: AROM;10 reps Gluteal Sets: AROM;10 reps Heel Slides: AROM;10 reps Hip ABduction/ADduction: AROM;10 reps      Assessment/Plan    PT Assessment Patient needs continued PT services  PT Diagnosis Difficulty walking;Generalized weakness   PT Problem List Decreased strength;Decreased range of motion;Decreased activity tolerance;Decreased balance;Decreased mobility;Pain  PT Treatment Interventions Therapeutic exercise;Stair training   PT Goals (Current goals can be found in the Care Plan section) Acute Rehab PT Goals Patient Stated Goal: To go home safely PT Goal Formulation: With patient Time For Goal Achievement: 12/09/14 Potential to Achieve Goals: Poor    Frequency Min 6X/week   Barriers to discharge Decreased caregiver support;Inaccessible home environment      Co-evaluation               End of Session Equipment Utilized During Treatment: Gait belt;Oxygen Activity Tolerance: Patient limited by fatigue Patient left: in bed;with call bell/phone within reach;with nursing/sitter in room Nurse Communication: Mobility status         Time: 1315-1401 PT Time Calculation (min) (ACUTE ONLY): 46 min   Charges:   PT Evaluation $Initial PT Evaluation Tier I: 1 Procedure PT Treatments $Gait Training: 8-22 mins $Therapeutic Exercise: 8-22 mins   PT G Codes:        Yailine Ballard R December 18, 2014, 2:16 PM
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
## 2014-12-02 NOTE — Consult Note (Signed)
Telepsych Consultation   Reason for Consult:  Psychiatric Evaluation/Depression Referring Physician:  Hosie Poisson, MD Patient Identification: [name] [name] MRN:  [id number] Principal Diagnosis:  Diagnosis:   Patient Active Problem List   Diagnosis Date Noted  . TIA (transient ischemic attack) [G45.9] 12/01/2014  . Left-sided weakness [M62.89] 07/26/2013  . PRECORDIAL PAIN [R07.2] 03/23/2010  . HYPERTENSION [I10] 03/15/2010  . GERD [K21.9] 03/15/2010  . DIABETES MELLITUS, BORDERLINE [R73.09] 03/15/2010    Total Time spent with patient: 25 minutes  Subjective:   [name] [name] is a 64 y.o. male patient who states "I fell in the kitchen and my hand, face, and leg swelled up."  HPI:  Patient presented to [facility] ED on 12/01/14 for evaluation of slurred speech, left-side facial droop and extremity weakness. He has a past medical history of HTN, borderline diabetes, blindness of left eye, GERD, OSA, depression and anxiety. Patient was noted to be depressed during his admission history and assessment and a tele-psych was ordered on 12/01/14. He states that on admission "I had a lot going on & I was dealing with my wife." He states he is the primary caregiver for his wife who is also a diabetic and who has had amputations making it difficult for her to ambulate. He endorses that he feels overwhelmed. He states that his wife recieves home health services daily for assistance with bathing and meals.  He also endorses feeling helpless and worthless at times. He has anhedonia. He reports difficulty sleeping; he is supposed to be on CPAP but states his machine got a leak last year and stopped working and he has not had it repaired and has been unable to obtain a replacement. Patient states his mood is "pretty good." Appetite is "good."   He reports he has had suicidal thoughts in the past with last thoughts being last year "in the fall." He denies prior suicide attempts. He has not been hospitalized in  the past for depression. He reports he has access to weapons; he states "I have a .38 in the refrigerator, .66 in the den, .23 in the office and a .25 in the kitchen." He does not have any children; states he had a daughter but she passed away. He is currently in outpatient therapy with Eli Phillips in [place], [place] and has been seeing her for the past 3 years. His last appointment was last month and his next appointment is "2 weeks from today." Today, he denies suicidal or homicidal ideation, intent or plan. He denies AVH. He is able to contract for safety and feels safe returning to his home.    HPI Elements:   Location:  Mood. Quality:  "feels hopeless and worthless sometimes". Severity:  mild to moderate. Timing:  ongoing. Duration:  prior to admission. Context:  stressors, caregiver strain.  Past Medical History:  Past Medical History  Diagnosis Date  . Hypertension   . Borderline diabetes mellitus   . 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 GERD (gastroesophageal reflux disease)   . Obstructive sleep apnea on CPAP   . Depression with anxiety   . Anxiety     Past Surgical History  Procedure Laterality Date  . Umbilical hernia repair    . Tonsillectomy    . Rectal surgery      As an infant   Family History:  Family History  Problem Relation Age of Onset  . Dementia Other   . Alzheimer's disease Other   .  Diabetes Other   . Coronary artery disease Other   . Heart attack Other   . Cancer Other   . Lung cancer Other   . Sudden death Sister   . Colon cancer Neg Hx   . Cancer Mother   . Cancer Father    Social History:  History  Alcohol Use No     History  Drug Use No    History   Social History  . Marital Status: Married    Spouse Name: N/A  . Number of Children: N/A  . Years of Education: N/A   Occupational History  . Disabled    Social History Main Topics  . Smoking status: Never Smoker   . Smokeless tobacco: Never Used  . Alcohol Use:  No  . Drug Use: No  . Sexual Activity: No   Other Topics Concern  . None   Social History Narrative   Married   No children   Has been on disability all his life due to significant decrease in vision and bowel problems   Tries to walk on a regular basis         Additional Social History:   Allergies:  No Known Allergies  Vitals: Blood pressure 152/89, pulse 67, temperature 97.9 F (36.6 C), temperature source Oral, resp. rate 18, SpO2 100 %.  Risk to Self: Is patient at risk for suicide?: No Risk to Others:   Prior Inpatient Therapy:   Prior Outpatient Therapy:    Current Facility-Administered Medications  Medication Dose Route Frequency Provider Last Rate Last Dose  . acetaminophen (TYLENOL) tablet 650 mg  650 mg Oral Q4H PRN Hosie Poisson, MD      . ALPRAZolam Duanne Moron) tablet 0.5 mg  0.5 mg Oral BID PRN Hosie Poisson, MD   0.5 mg at 12/01/14 2318  . aspirin tablet 325 mg  325 mg Oral Daily Hosie Poisson, MD   325 mg at 12/02/14 0901  . enoxaparin (LOVENOX) injection 40 mg  40 mg Subcutaneous Q24H Hosie Poisson, MD   40 mg at 12/01/14 1732  . gabapentin (NEURONTIN) capsule 300 mg  300 mg Oral TID Hosie Poisson, MD   300 mg at 12/02/14 0901  . insulin aspart (novoLOG) injection 0-5 Units  0-5 Units Subcutaneous QHS Hosie Poisson, MD   0 Units at 12/01/14 2157  . insulin aspart (novoLOG) injection 0-9 Units  0-9 Units Subcutaneous TID WC Hosie Poisson, MD   2 Units at 12/02/14 1220  . metoprolol succinate (TOPROL-XL) 24 hr tablet 100 mg  100 mg Oral Daily Hosie Poisson, MD   100 mg at 12/02/14 0903  . nitroGLYCERIN (NITROSTAT) SL tablet 0.4 mg  0.4 mg Sublingual Q5 min PRN Hosie Poisson, MD      . senna-docusate (Senokot-S) tablet 1 tablet  1 tablet Oral QHS PRN Hosie Poisson, MD      . traMADol Veatrice Bourbon) tablet 50 mg  50 mg Oral Q6H PRN Hosie Poisson, MD   50 mg at 12/02/14 0901    Musculoskeletal: Strength & Muscle Tone: patient in bed; assessed via telepsych Grandfather: patient  in bed; assessed via telepsych Patient leans: patient in bed; assessed via telepsych  Psychiatric Specialty Exam:     Blood pressure 152/89, pulse 67, temperature 97.9 F (36.6 C), temperature source Oral, resp. rate 18, SpO2 100 %.There is no weight on file to calculate BMI.  General Appearance: Fairly Groomed  Engineer, water::  Fair  Speech:  Clear and Coherent and Normal Rate  Volume:  Normal  Mood:  Feels worthless and hopeless sometimes  Affect:  Congruent  Thought Process:  Coherent and Goal Directed  Orientation:  Full (Time, Place, and Person)  Thought Content:  WDL  Suicidal Thoughts:  No  Homicidal Thoughts:  No  Memory:  Immediate;   Good Recent;   Fair Remote;   Fair  Judgement:  Fair  Insight:  Fair  Psychomotor Activity:  Normal  Concentration:  Fair  Recall:  [employer] of Knowledge:Fair  Language: Fair  Akathisia:  No  Handed:  Right  AIMS (if indicated):     Assets:  Communication Skills Desire for Improvement Housing  ADL's:  Intact  Cognition: WNL  Sleep:      Medical Decision Making: Self-Limited or Minor (1), Review of Psycho-Social Stressors (1) and Review or order clinical lab tests (1)    Plan: Patient does not meet criteria for inpatient psychiatric admission  Supportive therapy provided about ongoing stressors.  Attempted to contact patient's counselor, Eli Phillips at [facility] at [phone] to confirm appointment that is scheduled in the next 2 weeks; no answer at this number, voicemail not left. Recommend contacting patient's counselor to schedule an earlier appointment so that patient can have a follow-up after discharge from the hospital that is sooner than 2 week appointment that is scheduled. Recommend removal of weapons from the home.   Serena Colonel, FNP-BC [place] 12/02/2014 4:35 PM   Case discussed with me as above

## 2014-12-02 NOTE — Progress Notes (Signed)
SLP Cancellation Note  Patient Details Name: [name] [name] MRN: [id number] DOB: May 28, 1951   Cancelled treatment:       Reason Eval/Treat Not Completed: Patient at procedure or test/unavailable (Pt meeting with chaplain)   [place] 12/02/2014, 3:23 PM

## 2014-12-02 NOTE — Progress Notes (Signed)
*  PRELIMINARY RESULTS* Echocardiogram 2D Echocardiogram has been performed.  [name] [name] 12/02/2014, 11:26 AM

## 2014-12-02 NOTE — Consult Note (Signed)
[place] A. Merlene Laughter, MD     [email or web address]          [name] [name] is an 63 y.o. male.   ASSESSMENT/PLAN: 1. Acute syncope of unclear etiology. Imaging argues against this cute ischemic stroke. TIA is unlikely although possible. 2. Acute gait impairment. This is likely multifactorial. 3. Long-standing history of mild cognitive impairment/memory impairment. 4. Long-standing gait impairment with spasms of unclear etiology. 5. Obstructive sleep apnea syndrome.   RECOMMENDATIONS: 1. Repeat dementia labs including RPR and HIV. 2. Limited labs for neuromuscular diseases including CPK and acetylcholine receptor antibodies. 3. EEG. 4. Physical and occupational therapy. 5. Increase aspirin to 81 mg 2 tablets daily.  The patient is a 64 year old man who presents with multiple symptoms that are somewhat difficult to decipher. Chart states that he presents with numbness and weakness on both sides. The patient however tells me that he got up to walk around and just fell to the ground. The patient seemed to have little in the way of exactly what happened. He is somewhat elusive in the description. He does report having some dizziness as if he was intoxicated. He does still report having some dizziness. The patient is not sure if he lost consciousness. He has significant amnesia to the event were somewhat for possible seizures. He however reports that his legs simply gave out. He sustained significant head injuries to the facial region on the left side. He reports having a lot of ecchymotic spots on that side. He also reports having injured his right lower extremity and upper extremities. The patient reports having numbness involving the left facial region since then. He reports having pain involving the right leg associated with weakness. No dysarthria is reported. The patient reports having diplopia. He actually reports that the diplopia has been present for several years.  However, the patient did look from either on entering the room. The patient realizes with metastases in the past. I did pull over ER and we did see the patient initially 2004 for diplopia. Her in notes are copied below. Apparently, the diplopia may have gotten worse per the primary care provider but in talking to the patient at that time, he categorically stated that his diplopia was unchanging that he was a lifelong problem. The patient's diplopia was deemed to be monocular. He did have a workup at that time including acetylcholine receptor antibodies and thyroid function tests. These were unremarkable although only the blocking titers were done. We didpatient had evidence of myelopathy and he did have extensive imaging at that time including brain MRI, cervical spine MRI and thoracic spine MRI. There was a disc T8-T9 although no encroachment on the spinal cord was done. The patient did see Dr. Saintclair Halsted in [place] who also did a thoracic spine myelogram. The findings are outlined below and it did not show evidence of myelopathy or significant disc herniation. The study was essentially unrevealing. The patient tells me that he has been having short-term memory impairment and difficulty with concentration. This was actually somewhat is a complaints he had with slump 2004. The patient has been lost to follow-up. He was given Valium to help with leg spasms. The patient has a lot of positives on the review of systems is essentially everything I ask him is positive including some chest pain and shortness of breath, headaches, diffuse pain, pain of the right leg since his fall and the congenital left eye blindness.  GENERAL: Pleasant in no acute distress.  HEENT: Supple. Left  exotropia. Left pupil is semi- elliptically shaped and minimal reactive. Right is round and reactive. There is a relative apherrent  pupillary defect on the left.  ABDOMEN: soft  EXTREMITIES: No edema   BACK: Normal.  SKIN: Normal by  inspection.    MENTAL STATUS: Alert and oriented. Speech, language and cognition are generally intact. Judgment and insight normal.  He recognized that he has seen me in the past.  CRANIAL NERVES: The extra ocular movements are full although there appears to be some apraxia of upper word eye movements at times, there is no significant nystagmus; visual fields are full; upper and lower facial muscles are normal in strength and symmetric, there is no flattening of the nasolabial folds; tongue is midline; uvula is midline; shoulder elevation is normal.  MOTOR: Normal tone, bulk and strength- except the right lower extremity which appears to be in significant pain with strength graded as 4/5; no pronator drift.  COORDINATION: Left finger to nose is normal, right finger to nose is normal, No rest tremor; no intention tremor; no postural tremor; no bradykinesia.  REFLEXES: Deep tendon reflexes are symmetrical and normal. Babinski reflexes are flexor bilaterally.   SENSATION: Normal to light touch.      [[[[[[[[[[[[[[[[[[[[[[[[[[[[[[[[[[[[[[[[[[[[[[[[[[[[[[[[[[[[[[[[[[[[[[[[[[[[[[[[[[[[[[        [facility]. CONSULT  NOTE 12-2002  HISTORY OF PRESENT ILLNESS: Chief complaint: diplopia  The patient is a 64 year old righthanded 27 man who presents with a long-standing history of diplopia. The patient reports that as far as he can remember he has always had diplopia. He essentially has double vision every single moment of the day and has for his entire life. Reviewing the charts indicates that his double vision has been present for the last few months and has gotten worse. However on extensive and detailed questioning of the patient, it appears that the diplopia has been present for all his life with no clear worsening. Patient does report that his diplopia is worsening, however again he cannot state exactly how it is worse. When asked him to explain how it is worse he goes on to explain that  his right upper extremity has gotten weaker and he drops thinks from time to time. He also reports problems would memory impairment and difficulty concentrating.  Additionally, he reports numbness and paresthesias involving the hands particular the right side. During the examination he was noted to have weakness of the left foot with increased tone. He then volunteered that he has had weakness of the left leg over the past few weeks. Additionally, his legs occasionally give out under him particular the left side.  PAST MEDICAL HISTORY: Hypertension (high blood pressure) #401.9. Anxiety disorder. 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 Intractable hiccups. Syncope. Pneumonia. 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  Mother Still living doing fine.  TOBACCO: Denies  ALCOHOL: Denies  REVIEW OF SYSTEMS: Dizziness, Confusion, Blurred Vision, Numbness in hand and arm, Ringing in ears, Shortness of breath, Pneumonia, Heartburn, Constipation, Double Vision. The patient had a recent presyncopal episodes thought to be due to vasovagal mechanisms. He also had another episode of atypical chest pain and again vasovagal syncope. This was about four years ago. He was hospitalized  in the most recent physical episode had a negative cardiac evaluation, negative CT scan of the head and negative carotid doppler's.  SOCIAL HISTORY: 64 year old male married.  OBJECTIVE: VS: BP: 160/088. P: 075. RR: 20.   GENERAL: obese      ABDOMEN: soft EXTREMITIES: no edema; no varicosities SKIN: normal by inspection     MENTAL STATUS: Alert and oriented. 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   CRANIAL NERVES: The left pupil is low set relative the the right and odd shaped; It is also less reactive with reduced acuity; extraocular muscles intact with no nystagmus; he has double vision when both eyes are open and when the left by his opened by itself. There's no double vision went to right eye is opened by itself. The facial sensation is equal; upper and lower facial muscles are normal in strength and symmetric, there is no flattening of the nasolabial folds; tongue is midline; uvula is midline; gag reflex is normal; shoulder elevation is normal.  MOTOR: increased tone in the legs with 2/5 weakness; weakness of dorsiflexion on the right.   COORDINATION: Left finger to nose is normal, right finger to nose is normal, No rest tremor; no intention tremor; no postural tremor; no bradykinesia.  REFLEXES: deep tendon reflexes are symmetrical and brisk. Babinski reflexes are flexor on the left and extensor on the right.            SENSATION: normal to light touch, temperature, pin prick, joint position sense; but with reverse sensory level at T11-12.   GAIT: normal    Brain MRI done February 2004 shows two small increased signal intensity in the periventricular region.  IMPRESSION: 1. Essentially this appears to be a gentleman with a long-standing of Monocular diplopia which is overwhelmingly due to ophthalmic problems and not neurological disorders. I suspect that the monocular diplopia is a consequence of his ophthalmologic defects. 2. The patient's neurological exam is most significant however for upper motoneuron deficits. The differential diagnosis includes amyotrophic lateral sclerosis, multiple sclerosis, vasculitis, and the mass lesions. He also has some Century complaint suggesting the possibility of peripheral neuropathy.    RECOMMENDATION: 1. Cervical and thoracic MRI. 2. Electromyography uppers and lowers. 3. Blood  testing: thyroid stimulating hormone, Vitamin B12, acetyl choline receptor antibody. 4. Return to clinic after testing is completed. ]]]]]]]]]]]]]]]]]]]]]]]]]]]]]]]]]]]]]]]]]]]]]]]]]]]]]]]]]]]]]]]]]]]]]]]]]]]]]]]]]]]]]]]]]]]]]]]]]]]]]]]]]]]]]]]]]]]]]]]]]]]]]]]]]]]]]]]]]]]]]]]]]]]]]]]]]]]]]]]]]]]]]]]]]]]]]]]]]]]]]]]]]]]]]]]]  Blood pressure 178/81, pulse 68, temperature 98.3 F (36.8 C), temperature source Oral, resp. rate 16, SpO2 93 %.  Past Medical History  Diagnosis Date  . Hypertension   . Borderline diabetes mellitus   . 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 GERD (gastroesophageal reflux disease)   . Obstructive sleep apnea on CPAP   . Depression with anxiety   . Anxiety     Past Surgical History  Procedure Laterality Date  . Umbilical hernia repair    . Tonsillectomy    . Rectal surgery      As an infant    Family History  Problem Relation Age of Onset  . Dementia Other   . Alzheimer's disease Other   . Diabetes Other   . Coronary artery disease Other   . Heart attack Other   . Cancer Other   . Lung cancer Other   . Sudden death Sister   . Colon cancer Neg Hx   . Cancer Mother   . Cancer Father  Social History:  reports that he has never smoked. He has never used smokeless tobacco. He reports that he does not drink alcohol or use illicit drugs.  Allergies: No Known Allergies  Medications: Prior to Admission medications   Medication Sig Start Date End Date Taking? Authorizing Provider  ALPRAZolam Duanne Moron) 0.5 MG tablet Take 0.5 mg by mouth 2 (two) times daily as needed. Nerves/stress 11/06/14  Yes Historical Provider, MD  aspirin EC 81 MG tablet Take 81 mg by mouth every morning.   Yes Historical Provider, MD  furosemide (LASIX) 20 MG tablet Take 20 mg by mouth daily. 11/04/14  Yes Historical Provider, MD  hydrochlorothiazide (HYDRODIURIL) 25 MG tablet Take 25 mg by mouth daily.   Yes Historical Provider, MD  lisinopril (PRINIVIL,ZESTRIL) 20 MG  tablet Take 20 mg by mouth daily. 11/04/14  Yes Historical Provider, MD  metFORMIN (GLUCOPHAGE-XR) 500 MG 24 hr tablet Take 1,000 mg by mouth daily.  11/04/14  Yes Historical Provider, MD  metoprolol succinate (TOPROL-XL) 100 MG 24 hr tablet Take 100 mg by mouth daily. 11/04/14  Yes Historical Provider, MD  potassium chloride (K-DUR,KLOR-CON) 10 MEQ tablet Take 10 mEq by mouth 2 (two) times daily. 11/04/14  Yes Historical Provider, MD  triamcinolone cream (KENALOG) 0.1 % Apply 1 application topically 2 (two) times daily.   Yes Historical Provider, MD  nitroGLYCERIN (NITROSTAT) 0.4 MG SL tablet Place 0.4 mg under the tongue every 5 (five) minutes as needed for chest pain.    Historical Provider, MD    Scheduled Meds: . aspirin  325 mg Oral Daily  . enoxaparin (LOVENOX) injection  40 mg Subcutaneous Q24H  . gabapentin  300 mg Oral TID  . insulin aspart  0-5 Units Subcutaneous QHS  . insulin aspart  0-9 Units Subcutaneous TID WC  . metoprolol succinate  100 mg Oral Daily   Continuous Infusions:  PRN Meds:.acetaminophen, ALPRAZolam, nitroGLYCERIN, senna-docusate, traMADol     Results for orders placed or performed during the hospital encounter of 12/01/14 (from the past 48 hour(s))  CBC with Differential     Status: None   Collection Time: 12/01/14 10:35 AM  Result Value Ref Range   WBC 7.0 4.0 - 10.5 K/uL   RBC 5.17 4.22 - 5.81 MIL/uL   Hemoglobin 15.8 13.0 - 17.0 g/dL   HCT 44.5 39.0 - 52.0 %   MCV 86.1 78.0 - 100.0 fL   MCH 30.6 26.0 - 34.0 pg   MCHC 35.5 30.0 - 36.0 g/dL   RDW 13.5 11.5 - 15.5 %   Platelets 201 150 - 400 K/uL   Neutrophils Relative % 64 43 - 77 %   Neutro Abs 4.5 1.7 - 7.7 K/uL   Lymphocytes Relative 25 12 - 46 %   Lymphs Abs 1.8 0.7 - 4.0 K/uL   Monocytes Relative 8 3 - 12 %   Monocytes Absolute 0.5 0.1 - 1.0 K/uL   Eosinophils Relative 2 0 - 5 %   Eosinophils Absolute 0.2 0.0 - 0.7 K/uL   Basophils Relative 1 0 - 1 %   Basophils Absolute 0.0 0.0 - 0.1 K/uL    Comprehensive metabolic panel     Status: Abnormal   Collection Time: 12/01/14 10:35 AM  Result Value Ref Range   Sodium 137 135 - 145 mmol/L   Potassium 3.6 3.5 - 5.1 mmol/L   Chloride 103 96 - 112 mmol/L   CO2 30 19 - 32 mmol/L   Glucose, Bld 226 (H) 70 - 99 mg/dL  BUN 9 6 - 23 mg/dL   Creatinine, Ser 1.12 0.50 - 1.35 mg/dL   Calcium 9.0 8.4 - 10.5 mg/dL   Total Protein 7.3 6.0 - 8.3 g/dL   Albumin 4.1 3.5 - 5.2 g/dL   AST 33 0 - 37 U/L   ALT 46 0 - 53 U/L   Alkaline Phosphatase 55 39 - 117 U/L   Total Bilirubin 2.2 (H) 0.3 - 1.2 mg/dL   GFR calc non Af Amer 69 (L) >90 mL/min   GFR calc Af Amer 80 (L) >90 mL/min    Comment: (NOTE) The eGFR has been calculated using the CKD EPI equation. This calculation has not been validated in all clinical situations. eGFR's persistently <90 mL/min signify possible Chronic Kidney Disease.    Anion gap 4 (L) 5 - 15  Protime-INR     Status: None   Collection Time: 12/01/14 10:35 AM  Result Value Ref Range   Prothrombin Time 13.4 11.6 - 15.2 seconds   INR 1.01 0.00 - 1.49  APTT     Status: None   Collection Time: 12/01/14 10:35 AM  Result Value Ref Range   aPTT 28 24 - 37 seconds  Ethanol     Status: None   Collection Time: 12/01/14 10:38 AM  Result Value Ref Range   Alcohol, Ethyl (B) <5 0 - 9 mg/dL    Comment:        LOWEST DETECTABLE LIMIT FOR SERUM ALCOHOL IS 11 mg/dL FOR MEDICAL PURPOSES ONLY   I-Stat Troponin, ED (not at [facility])     Status: None   Collection Time: 12/01/14 10:58 AM  Result Value Ref Range   Troponin i, poc 0.00 0.00 - 0.08 ng/mL   Comment 3            Comment: Due to the release kinetics of cTnI, a negative result within the first hours of the onset of symptoms does not rule out myocardial infarction with certainty. If myocardial infarction is still suspected, repeat the test at appropriate intervals.   I-Stat Chem 8, ED     Status: Abnormal   Collection Time: 12/01/14 11:00 AM  Result Value Ref Range    Sodium 141 135 - 145 mmol/L   Potassium 3.6 3.5 - 5.1 mmol/L   Chloride 101 96 - 112 mmol/L   BUN 8 6 - 23 mg/dL   Creatinine, Ser 0.90 0.50 - 1.35 mg/dL   Glucose, Bld 215 (H) 70 - 99 mg/dL   Calcium, Ion 1.21 1.13 - 1.30 mmol/L   TCO2 25 0 - 100 mmol/L   Hemoglobin 17.0 13.0 - 17.0 g/dL   HCT 50.0 39.0 - 52.0 %  Urine Drug Screen     Status: None   Collection Time: 12/01/14 11:00 AM  Result Value Ref Range   Opiates NONE DETECTED NONE DETECTED   Cocaine NONE DETECTED NONE DETECTED   Benzodiazepines NONE DETECTED NONE DETECTED   Amphetamines NONE DETECTED NONE DETECTED   Tetrahydrocannabinol NONE DETECTED NONE DETECTED   Barbiturates NONE DETECTED NONE DETECTED    Comment:        DRUG SCREEN FOR MEDICAL PURPOSES ONLY.  IF CONFIRMATION IS NEEDED FOR ANY PURPOSE, NOTIFY LAB WITHIN 5 DAYS.        LOWEST DETECTABLE LIMITS FOR URINE DRUG SCREEN Drug Class       Cutoff (ng/mL) Amphetamine      1000 Barbiturate      200 Benzodiazepine   715 Tricyclics  300 Opiates          300 Cocaine          300 THC              50   Urinalysis, Routine w reflex microscopic     Status: Abnormal   Collection Time: 12/01/14 11:00 AM  Result Value Ref Range   Color, Urine YELLOW YELLOW   APPearance CLEAR CLEAR   Specific Gravity, Urine 1.010 1.005 - 1.030   pH 6.0 5.0 - 8.0   Glucose, UA 250 (A) NEGATIVE mg/dL   Hgb urine dipstick NEGATIVE NEGATIVE   Bilirubin Urine NEGATIVE NEGATIVE   Ketones, ur NEGATIVE NEGATIVE mg/dL   Protein, ur NEGATIVE NEGATIVE mg/dL   Urobilinogen, UA 0.2 0.0 - 1.0 mg/dL   Nitrite NEGATIVE NEGATIVE   Leukocytes, UA NEGATIVE NEGATIVE    Comment: MICROSCOPIC NOT DONE ON URINES WITH NEGATIVE PROTEIN, BLOOD, LEUKOCYTES, NITRITE, OR GLUCOSE <1000 mg/dL.  Glucose, capillary     Status: Abnormal   Collection Time: 12/01/14  4:32 PM  Result Value Ref Range   Glucose-Capillary 138 (H) 70 - 99 mg/dL   Comment 1 Notify RN    Comment 2 Document in Chart    Glucose, capillary     Status: Abnormal   Collection Time: 12/01/14  9:32 PM  Result Value Ref Range   Glucose-Capillary 143 (H) 70 - 99 mg/dL   Comment 1 Notify RN   Lipid panel     Status: Abnormal   Collection Time: 12/02/14  6:42 AM  Result Value Ref Range   Cholesterol 132 0 - 200 mg/dL   Triglycerides 229 (H) <150 mg/dL   HDL 22 (L) >39 mg/dL   Total CHOL/HDL Ratio 6.0 RATIO   VLDL 46 (H) 0 - 40 mg/dL   LDL Cholesterol 64 0 - 99 mg/dL    Comment:        Total Cholesterol/HDL:CHD Risk Coronary Heart Disease Risk Table                     Men   Women  1/2 Average Risk   3.4   3.3  Average Risk       5.0   4.4  2 X Average Risk   9.6   7.1  3 X Average Risk  23.4   11.0        Use the calculated Patient Ratio above and the CHD Risk Table to determine the patient's CHD Risk.        ATP III CLASSIFICATION (LDL):  <100     mg/dL   Optimal  100-129  mg/dL   Near or Above                    Optimal  130-159  mg/dL   Borderline  160-189  mg/dL   High  >190     mg/dL   Very High   Glucose, capillary     Status: Abnormal   Collection Time: 12/02/14  7:34 AM  Result Value Ref Range   Glucose-Capillary 150 (H) 70 - 99 mg/dL   Comment 1 Notify RN    Comment 2 Document in Chart     Studies/Results: BRAIN MRI There is no evidence of acute infarct, intracranial hemorrhage, mass, midline shift, or extra-axial fluid collection. Mild, age appropriate cerebral atrophy is present. Small foci of T2 hyperintensity in the cerebral white matter are nonspecific but compatible with minimal chronic small vessel ischemic  disease.  Orbits are unremarkable. Paranasal sinuses are clear. There are small bilateral mastoid effusions. Major intracranial vascular flow voids are preserved.  IMPRESSION: 1. No acute intracranial abnormality. 2. Minimal chronic small vessel ischemic disease.  Brain MRI scan is reviewed in person. There is nothing acute seen on diffusion imaging. There is  mild global atrophy. There is mild periventricular leukoencephalopathy. There to moderate sized areas of increased signal seen in the white matter involving the left posterior frontal parietal regions. Nothing is seen on the spin echo gradient contrast imaging suggestive of chronic hemorrhage.  CT MYELOGRAM T-SPINE 2005 1. Chronic right posterolateral disk herniation at T8-9 which is so small that it makes only a minor impression upon the ventral aspect of the thecal sac. There is ample subarachnoid space dorsal to the cord. No foraminal compromise. 2. Insignificant anterior osteophytes.  Terik Haughey A. Merlene Laughter, M.D.  Diplomate, [employer] of Psychiatry and Neurology ( Neurology). 12/02/2014, 7:58 AM

## 2014-12-02 NOTE — Evaluation (Signed)
Occupational Therapy Evaluation Patient Details Name: [name] [name] MRN: [id number] DOB: Jan 04, 1951 Today's Date: 12/02/2014    History of Present Illness h/o hypertension, DM, comes in for left sided facial droop and slurred speech associated with left sided facial numbness and right sided weakness all started last night and persisted till he came to ED, . As per EDP, the facial droop improved after a few hours.  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 He also reports some pain in the right lower extremity. He will admitted to medical service for evaluation of TIA. His CT and mri brain are negative for acute stroke   Clinical Impression   Patient is not showing any TIA symptoms at this time. Pt's main concern is increased right leg pain. Right leg is painful to the touch. During transfer patient required Min A for safety as he felt that his right leg would give out. Pt reports that he has back issues and decreased UB strength which is baseline. Unsure at this point what an appropriate discharge will be. According to patient's chart he's had a diagnosis of Conversion Disorder and I'm not sure if this is the same case as he's been to the ED in the past with similar TIA like symptoms. Will keep patient on OT caseload to follow up and see how he progresses with PT.     Follow Up Recommendations  Other (comment) (TBD based on Right leg pain and progression with PT)    Equipment Recommendations  None recommended by OT       Precautions / Restrictions Precautions Precautions: None Restrictions Weight Bearing Restrictions: No      Mobility Bed Mobility Overal bed mobility: Needs Assistance Bed Mobility: Supine to Sit;Sit to Supine     Supine to sit: HOB elevated;Min assist Sit to supine: Modified independent (Device/Increase time)      Transfers Overall transfer level: Needs  assistance Equipment used: Rolling walker (2 wheeled) Transfers: Sit to/from Stand Sit to Stand: Min assist                   ADL Overall ADL's : Needs assistance/impaired                     Lower Body Dressing: Moderate assistance;Sit to/from stand;Bed level   Toilet Transfer: Minimal assistance;RW             General ADL Comments: Pt reports that he feels like his right leg will give out when standing.      Vision Vision Assessment?: No apparent visual deficits          Pertinent Vitals/Pain Pain Assessment: 0-10 Pain Score: 10-Worst pain ever Pain Location: Right leg Pain Descriptors / Indicators: Throbbing;Aching;Constant Pain Intervention(s): Patient requesting pain meds-RN notified     Hand Dominance Right   Extremity/Trunk Assessment Upper Extremity Assessment Upper Extremity Assessment: Generalized weakness (at basline per patient)   Lower Extremity Assessment Lower Extremity Assessment: Defer to PT evaluation       Communication Communication Communication: No difficulties   Cognition Arousal/Alertness: Awake/alert Behavior During Therapy: WFL for tasks assessed/performed Overall Cognitive Status: Within Functional Limits for tasks assessed                                Home Living Family/patient expects to be discharged to:: Private  residence Living Arrangements: Spouse/significant other   Type of Home: Apartment Home Access: Level entry           Bathroom Shower/Tub: Teacher, early years/pre: Handicapped height     Home Equipment: Environmental consultant - 2 wheels;Cane - single point;Shower seat;Grab bars - tub/shower;Grab bars - toilet   Additional Comments: Wife is a amputee due to DM and has a caregiver that helps her 2 hours a day. pt reports previous back issues.      Prior Functioning/Environment Level of Independence: Independent        Comments: Retired     OT Diagnosis: Generalized weakness    OT Problem List: Decreased strength;Decreased activity tolerance   OT Treatment/Interventions: Self-care/ADL training;DME and/or AE instruction;Patient/family education    OT Goals(Current goals can be found in the care plan section) Acute Rehab OT Goals Patient Stated Goal: To be able to go home safely OT Goal Formulation: With patient Time For Goal Achievement: 12/16/14 Potential to Achieve Goals: Good  OT Frequency: Min 1X/week   Barriers to D/C: Decreased caregiver support             End of Session Equipment Utilized During Treatment: Gait belt;Rolling walker Nurse Communication: Mobility status;Patient requests pain meds  Activity Tolerance: Patient tolerated treatment well Patient left: in bed;with call bell/phone within reach (Bed alarm not functioning at this time)   Time: 0820-0850 OT Time Calculation (min): 30 min Charges:  OT General Charges $OT Visit: 1 Procedure OT Evaluation $Initial OT Evaluation Tier I: 1 Procedure G-Codes: OT G-codes **NOT FOR INPATIENT CLASS** Functional Assessment Tool Used: clinical judgement Functional Limitation: Self care Self Care Current Status (O7564): At least 40 percent but less than 60 percent impaired, limited or restricted Self Care Goal Status (P3295): At least 20 percent but less than 40 percent impaired, limited or restricted  Ailene Ravel, OTR/L,CBIS  [phone]  12/02/2014, 9:26 AM

## 2014-12-02 NOTE — Care Management Utilization Note (Signed)
UR completed 

## 2014-12-02 NOTE — Progress Notes (Signed)
EEG Completed; Results Pending  

## 2014-12-02 NOTE — Progress Notes (Signed)
[facility] PROGRESS NOTE  [name] [name] WFU:[id number] DOB: October 28, 1950 DOA: 12/01/2014 PCP: Deloria Lair, MD  Assessment/Plan: #1 left-sided weakness Questionable etiology. There is some concern as to whether patient may have had a syncopal episode. Patient still with complaints of left-sided weakness however on physical exam has equivalent bilateral upper extremity strength. CT head, MRI MRA head negative for acute CVA. Per neurology patient does have monocular diplopia and will likely need to follow-up with ophthalmology as outpatient. EEG has been ordered and is pending. RPR, HIV pending. PT/OT. Continue aspirin. Neurology following and appreciate input and recommendations.  #2 diabetes mellitus Continue sliding scale insulin.  #3 hypertension Stable. Continue home dose metoprolol. Resume lisinopril.  #4 gastroesophageal reflux disease PPI.  #5 obstructive sleep apnea CPAP daily at bedtime.  #6 depression Patient has been assessed by psychiatry recommending outpatient follow-up.  #7 monocular diplopia Outpatient follow-up with ophthalmology.  #8 prophylaxis PPI for GI prophylaxis. Lovenox for DVT prophylaxis.  Code Status: Full Family Communication: Updated patient at bedside. No family present. Disposition Plan: Remain inpatient.   Consultants:  Neurology: Dr. Merlene Laughter 12/02/2014  Psychiatry to 10 2016  Procedures:  MRI/MRA head 12/01/2014, 12/02/2014  CT head 12/01/2014  Chest x-ray 12/01/2014  2-D echo 12/02/2014  EEG 12/02/2014  Antibiotics:  None  HPI/Subjective: Patient states no significant improvement with his left-sided weakness that he had presented with.  Objective: Filed Vitals:   12/02/14 1011  BP: 152/89  Pulse: 67  Temp: 97.9 F (36.6 C)  Resp: 18    Intake/Output Summary (Last 24 hours) at 12/02/14 1942 Last data filed at 12/02/14 1700  Gross per 24 hour  Intake    480 ml  Output    700 ml  Net   -220 ml   There  were no vitals filed for this visit.  Exam:   General:  NAD  Cardiovascular: RRR  Respiratory: CTAB  Abdomen: Soft, nontender, nondistended, positive bowel sounds.  Musculoskeletal: No clubbing cyanosis or edema.5/5  BUE strength.5/5 left lower extremity strength. 3-4/5 right lower extremity strength.  Data Reviewed: Basic Metabolic Panel:  Recent Labs Lab 12/01/14 1035 12/01/14 1100  NA 137 141  K 3.6 3.6  CL 103 101  CO2 30  --   GLUCOSE 226* 215*  BUN 9 8  CREATININE 1.12 0.90  CALCIUM 9.0  --    Liver Function Tests:  Recent Labs Lab 12/01/14 1035  AST 33  ALT 46  ALKPHOS 55  BILITOT 2.2*  PROT 7.3  ALBUMIN 4.1   No results for input(s): LIPASE, AMYLASE in the last 168 hours. No results for input(s): AMMONIA in the last 168 hours. CBC:  Recent Labs Lab 12/01/14 1035 12/01/14 1100  WBC 7.0  --   NEUTROABS 4.5  --   HGB 15.8 17.0  HCT 44.5 50.0  MCV 86.1  --   PLT 201  --    Cardiac Enzymes:  Recent Labs Lab 12/02/14 1216  CKTOTAL 70  CKMB 2.7   BNP (last 3 results) No results for input(s): BNP in the last 8760 hours.  ProBNP (last 3 results) No results for input(s): PROBNP in the last 8760 hours.  CBG:  Recent Labs Lab 12/01/14 1632 12/01/14 2132 12/02/14 0734 12/02/14 1145 12/02/14 1647  GLUCAP 138* 143* 150* 165* 168*    No results found for this or any previous visit (from the past 240 hour(s)).   Studies: Dg Chest 2 View  12/01/2014   CLINICAL DATA:  Left-sided weakness and  slurred speech.  EXAM: CHEST  2 VIEW  COMPARISON:  July 26, 2013.  FINDINGS: The heart size and mediastinal contours are within normal limits. No pneumothorax is noted. Increased interstitial densities are noted in the perihilar and basilar regions concerning for pulmonary edema. Mild left pleural effusion is noted. Increased mild loculated right pleural effusion is noted. The visualized skeletal structures are unremarkable.  IMPRESSION: Probable mild  bilateral perihilar and basilar edema with associated mild pleural effusions.   Electronically Signed   By: Marijo Conception, M.D.   On: 12/01/2014 20:33   Ct Head Wo Contrast  12/01/2014   CLINICAL DATA:  64 year old male with slurred speech and left side weakness since 2100 hours. Dense weakness of the left face. Code stroke. Initial encounter.  EXAM: CT HEAD WITHOUT CONTRAST  TECHNIQUE: Contiguous axial images were obtained from the base of the skull through the vertex without intravenous contrast.  COMPARISON:  09/21/2013 and earlier.  FINDINGS: Stable paranasal sinuses and mastoids. No acute osseous abnormality identified. Negative orbit and scalp soft tissues.  Cerebral volume is stable and within normal limits for age. No ventriculomegaly. No midline shift, mass effect, or evidence of intracranial mass lesion. No evidence of cortically based acute infarction identified. Dominant distal left vertebral artery again suspected. No suspicious intracranial vascular hyperdensity. No acute intracranial hemorrhage identified.  IMPRESSION: Stable and normal for age noncontrast CT appearance of the brain.  Study discussed by telephone with Dr. Joseph Berkshire on 12/01/2014 at 12:01 .   Electronically Signed   By: Genevie Ann M.D.   On: 12/01/2014 12:02   Mr Jodene Nam Head Wo Contrast  12/02/2014   CLINICAL DATA:  [place] at home.  Weakness.  EXAM: MRA HEAD WITHOUT CONTRAST  TECHNIQUE: Angiographic images of the Circle of Willis were obtained using MRA technique without intravenous contrast.  COMPARISON:  MRI head 12/01/2014  FINDINGS: Left vertebral dominant and widely patent. Right vertebral artery is small. Decreased signal right vertebral artery felt to be due to tortuosity. PICA patent bilaterally. Basilar widely patent. Superior cerebellar and posterior cerebral arteries appear normal  Internal carotid artery widely patent bilaterally. Anterior and middle cerebral arteries widely patent without stenosis  Negative for  cerebral aneurysm.  IMPRESSION: Negative   Electronically Signed   By: Franchot Gallo M.D.   On: 12/02/2014 11:27   Mr Brain Wo Contrast  12/01/2014   CLINICAL DATA:  Left facial droop.  Slurred speech.  EXAM: MRI HEAD WITHOUT CONTRAST  TECHNIQUE: Multiplanar, multiecho pulse sequences of the brain and surrounding structures were obtained without intravenous contrast.  COMPARISON:  Head CT 12/01/2014 and MRI 12/03/2003  FINDINGS: There is no evidence of acute infarct, intracranial hemorrhage, mass, midline shift, or extra-axial fluid collection. Mild, age appropriate cerebral atrophy is present. Small foci of T2 hyperintensity in the cerebral white matter are nonspecific but compatible with minimal chronic small vessel ischemic disease.  Orbits are unremarkable. Paranasal sinuses are clear. There are small bilateral mastoid effusions. Major intracranial vascular flow voids are preserved.  IMPRESSION: 1. No acute intracranial abnormality. 2. Minimal chronic small vessel ischemic disease.   Electronically Signed   By: Logan Bores   On: 12/01/2014 16:12    Scheduled Meds: . aspirin  325 mg Oral Daily  . enoxaparin (LOVENOX) injection  40 mg Subcutaneous Q24H  . gabapentin  300 mg Oral TID  . insulin aspart  0-5 Units Subcutaneous QHS  . insulin aspart  0-9 Units Subcutaneous TID WC  . lisinopril  20 mg Oral Daily  . metoprolol succinate  100 mg Oral Daily  . [START ON 12/03/2014] pantoprazole  40 mg Oral Q0600   Continuous Infusions:   Principal Problem:   Left-sided weakness Active Problems:   Essential hypertension   GERD   TIA (transient ischemic attack)   Depressive disorder   Monocular diplopia   Abnormality of gait    Time spent: 37 minutes    Cloud Graham M.D. [facility] Pager [phone]. If 7PM-7AM, please contact night-coverage at [email or web address], password [facility] 12/02/2014, 7:42 PM  LOS: 1 day

## 2014-12-02 NOTE — Clinical Social Work Note (Signed)
CSW met with patient. CSW discussed PT recommendation for SNF. CSW provided patient with SNF list. CSW discussed patient's insurance related to placement.   CSW gave patient time to review SNF list.   CSW again met with patient. Patient indicated that after reviewing the list and thinking about it, he has decided to go home and "try to make it." He indicated that if he gets home and finds that he cannot manage, he will contact his previous home health provider.   CSW signing off.   Ambrose Pancoast, [place]

## 2014-12-03 ENCOUNTER — Inpatient Hospital Stay (HOSPITAL_COMMUNITY): Payer: Medicaid Other

## 2014-12-03 LAB — BASIC METABOLIC PANEL
Anion gap: 10 (ref 5–15)
BUN: 17 mg/dL (ref 6–23)
CALCIUM: 9.3 mg/dL (ref 8.4–10.5)
CHLORIDE: 103 mmol/L (ref 96–112)
CO2: 27 mmol/L (ref 19–32)
CREATININE: 1.03 mg/dL (ref 0.50–1.35)
GFR calc non Af Amer: 76 mL/min — ABNORMAL LOW (ref >90)
GFR, EST AFRICAN AMERICAN: 89 mL/min — AB (ref >90)
Glucose, Bld: 144 mg/dL — ABNORMAL HIGH (ref 70–99)
Potassium: 3.8 mmol/L (ref 3.5–5.1)
Sodium: 140 mmol/L (ref 135–145)

## 2014-12-03 LAB — CBC
HCT: 44.6 % (ref 39.0–52.0)
Hemoglobin: 15.3 g/dL (ref 13.0–17.0)
MCH: 29.7 pg (ref 26.0–34.0)
MCHC: 34.3 g/dL (ref 30.0–36.0)
MCV: 86.6 fL (ref 78.0–100.0)
Platelets: 209 10*3/uL (ref 150–400)
RBC: 5.15 MIL/uL (ref 4.22–5.81)
RDW: 14.1 % (ref 11.5–15.5)
WBC: 7.7 10*3/uL (ref 4.0–10.5)

## 2014-12-03 LAB — HEMOGLOBIN A1C
Hgb A1c MFr Bld: 7.6 % — ABNORMAL HIGH (ref 4.8–5.6)
Mean Plasma Glucose: 171 mg/dL

## 2014-12-03 LAB — RPR: RPR Ser Ql: NONREACTIVE

## 2014-12-03 LAB — VITAMIN B12: Vitamin B-12: 381 pg/mL (ref 211–911)

## 2014-12-03 LAB — GLUCOSE, CAPILLARY
Glucose-Capillary: 149 mg/dL — ABNORMAL HIGH (ref 70–99)
Glucose-Capillary: 203 mg/dL — ABNORMAL HIGH (ref 70–99)

## 2014-12-03 LAB — HOMOCYSTEINE: HOMOCYSTEINE-NORM: 13.5 umol/L (ref 0.0–15.0)

## 2014-12-03 LAB — HIV ANTIBODY (ROUTINE TESTING W REFLEX): HIV Screen 4th Generation wRfx: NONREACTIVE

## 2014-12-03 MED ORDER — ASPIRIN 325 MG PO TABS
325.0000 mg | ORAL_TABLET | Freq: Every day | ORAL | Status: DC
Start: 2014-12-03 — End: 2022-11-15

## 2014-12-03 MED ORDER — GABAPENTIN 300 MG PO CAPS: 300.0000 mg | ORAL_CAPSULE | Freq: Three times a day (TID) | ORAL | Status: DC

## 2014-12-03 NOTE — Progress Notes (Signed)
IV and telemetry removed. Patient asked about a bipap machine for home. Case management and Dr. Jerilee Hoh notified. They will see what needs to be done before finishing patients discharge.

## 2014-12-03 NOTE — Procedures (Signed)
  [place] A. Merlene Laughter, MD     [email or web address]           HISTORY: The patient is a 64 year old male who presents with spells of confusion and unresponsiveness suspicious for seizure activity.  MEDICATIONS: Scheduled Meds: . aspirin  325 mg Oral Daily  . enoxaparin (LOVENOX) injection  40 mg Subcutaneous Q24H  . gabapentin  300 mg Oral TID  . insulin aspart  0-5 Units Subcutaneous QHS  . insulin aspart  0-9 Units Subcutaneous TID WC  . lisinopril  20 mg Oral Daily  . metoprolol succinate  100 mg Oral Daily  . pantoprazole  40 mg Oral Q0600   Continuous Infusions:  PRN Meds:.acetaminophen, ALPRAZolam, nitroGLYCERIN, senna-docusate, traMADol  Prior to Admission medications   Medication Sig Start Date End Date Taking? Authorizing Provider  ALPRAZolam Duanne Moron) 0.5 MG tablet Take 0.5 mg by mouth 2 (two) times daily as needed. Nerves/stress 11/06/14  Yes Historical Provider, MD  aspirin EC 81 MG tablet Take 81 mg by mouth every morning.   Yes Historical Provider, MD  furosemide (LASIX) 20 MG tablet Take 20 mg by mouth daily. 11/04/14  Yes Historical Provider, MD  hydrochlorothiazide (HYDRODIURIL) 25 MG tablet Take 25 mg by mouth daily.   Yes Historical Provider, MD  lisinopril (PRINIVIL,ZESTRIL) 20 MG tablet Take 20 mg by mouth daily. 11/04/14  Yes Historical Provider, MD  metFORMIN (GLUCOPHAGE-XR) 500 MG 24 hr tablet Take 1,000 mg by mouth daily.  11/04/14  Yes Historical Provider, MD  metoprolol succinate (TOPROL-XL) 100 MG 24 hr tablet Take 100 mg by mouth daily. 11/04/14  Yes Historical Provider, MD  potassium chloride (K-DUR,KLOR-CON) 10 MEQ tablet Take 10 mEq by mouth 2 (two) times daily. 11/04/14  Yes Historical Provider, MD  triamcinolone cream (KENALOG) 0.1 % Apply 1 application topically 2 (two) times daily.   Yes Historical Provider, MD  nitroGLYCERIN (NITROSTAT) 0.4 MG SL tablet Place 0.4 mg under the tongue every 5 (five) minutes as needed for chest pain.     Historical Provider, MD      ANALYSIS: A 16 channel recording using standard 10 20 measurements is conducted for 22 minutes. There is a well-formed posterior dominant rhythm of 10 Hz which attenuates with eye opening. There is beta activity observed in the frontal areas. Awake and drowsy activities are observed. Photic stimulation and hyperventilation were not carried out. There is no focal or lateral slowing. There is no epileptiform activity is observed.   IMPRESSION: This test is normal recording of awake and drowsy states.      Helene Bernstein A. Merlene Laughter, M.D.  Diplomate, Tax adviser of Psychiatry and Neurology ( Neurology).

## 2014-12-03 NOTE — Care Management Note (Signed)
    Page 1 of 2   12/03/2014     5:25:28 PM CARE MANAGEMENT NOTE 12/03/2014  Patient:  [name] [name], [name] [name]   Account Number:  [id number]  Date Initiated:  12/03/2014  Documentation initiated by:  Vladimir Creeks  Subjective/Objective Assessment:   Pt admitted with TIA? symptoms. He is from home with spouse and has a friend in the room with him. They say, he and the friend that the spouse is not very helpful, and in fact is "mean" to the pt, and does not like him to have help in the     Action/Plan:   home. He say he had an aide, but spouse" ran" her off. States he needs the help and would like the aide, from [place], back. Home  PT also ordered and pt requests [facility]. CSW spoke with pt about SNF, but he refuses. He has a walker at home   Anticipated DC Date:  12/03/2014   Anticipated DC Plan:  [place] referral  Clinical Social Worker      DC Planning Services  CM consult      [facility] Choice  HOME HEALTH   Choice offered to / List presented to:  C-1 Patient        [place] arranged  [place] PT      [facility] agency  [place]   Status of service:  Completed, signed off Medicare Important Message given?   (If response is "NO", the following Medicare IM given date fields will be blank) Date Medicare IM given:   Medicare IM given by:   Date Additional Medicare IM given:   Additional Medicare IM given by:    Discharge Disposition:  HOME/SELF CARE  Per UR Regulation:  Reviewed for med. necessity/level of care/duration of stay  If discussed at [place] of Stay Meetings, dates discussed:    Comments:  12/03/14 [place] RN/CM Pt referred to [place], but Medicaid will not cover [facility] PT. Will call [place] aide services in the am and have them call patient concerning restarting the aide services, if he would like

## 2014-12-03 NOTE — Discharge Summary (Signed)
Physician Discharge Summary  [name] [name] KDX:[id number] DOB: 1951-07-15 DOA: 12/01/2014  PCP: Deloria Lair, MD  Admit date: 12/01/2014 Discharge date: 12/03/2014  Time spent: 45 minutes  Recommendations for Outpatient Follow-up:  -Will be discharged home today. -Advised to follow up with PCP in 2 weeks.   Discharge Diagnoses:  Principal Problem:   Left-sided weakness Active Problems:   Essential hypertension   GERD   TIA (transient ischemic attack)   Depressive disorder   Monocular diplopia   Abnormality of gait   Discharge Condition: Stable and improved  Filed Weights   12/02/14 2017  Weight: 92.806 kg (204 lb 9.6 oz)    History of present illness:  [name] [name] is a 64 y.o. male  With prior h/o hypertension, DM, comes in for left sided facial droop and slurred speech associated with left sided facial numbness and right sided weakness all started last night and persisted till he came to ED, . As per EDP, the facial droop improved after a few hours. Currently he doesn't have any symptoms. He was referred to medical service for admission for TIA. 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 He also reports some pain in the right lower extremity. He denies any other complaints. He denies chest pain, sob, dizziness, syncope, nausea, vomiting and diarrhea. He will admitted to medical service for evaluation of TIA. His CT and mri brain are negative for acute stroke and he was started on aspirin 325 mg daily.   Hospital Course:   #1 left-sided weakness Questionable etiology.  Possibly a TIA.  CT head, MRI MRA head negative for acute CVA. Per neurology patient does have monocular diplopia and will likely need to follow-up with ophthalmology as outpatient. Marland Kitchen Has been seen by Neurology. PT/OT recommended SNF but he declined and [facility] services have been arranged.  #2 diabetes  mellitus Fair control  #3 hypertension Stable. Continue home dose metoprolol. Resume lisinopril.  #4 gastroesophageal reflux disease PPI.  #5 obstructive sleep apnea CPAP daily at bedtime.  #6 depression Patient has been assessed by psychiatry recommending outpatient follow-up.  #7 monocular diplopia Outpatient follow-up with ophthalmology.  #8 prophylaxis PPI for GI prophylaxis. Lovenox for DVT prophylaxis.  Procedures:  None   Consultations:  Psychiatry  Neurology  Discharge Instructions  Discharge Instructions    Diet - low sodium heart healthy    Complete by:  As directed      Increase activity slowly    Complete by:  As directed             Medication List    STOP taking these medications        aspirin EC 81 MG tablet  Replaced by:  aspirin 325 MG tablet     hydrochlorothiazide 25 MG tablet  Commonly known as:  HYDRODIURIL      TAKE these medications        ALPRAZolam 0.5 MG tablet  Commonly known as:  XANAX  Take 0.5 mg by mouth 2 (two) times daily as needed. Nerves/stress     aspirin 325 MG tablet  Take 1 tablet (325 mg total) by mouth daily.     furosemide 20 MG tablet  Commonly known as:  LASIX  Take 20 mg by mouth daily.     gabapentin 300 MG capsule  Commonly known as:  NEURONTIN  Take 1 capsule (300 mg total) by mouth  3 (three) times daily.     lisinopril 20 MG tablet  Commonly known as:  PRINIVIL,ZESTRIL  Take 20 mg by mouth daily.     metFORMIN 500 MG 24 hr tablet  Commonly known as:  GLUCOPHAGE-XR  Take 1,000 mg by mouth daily.     metoprolol succinate 100 MG 24 hr tablet  Commonly known as:  TOPROL-XL  Take 100 mg by mouth daily.     nitroGLYCERIN 0.4 MG SL tablet  Commonly known as:  NITROSTAT  Place 0.4 mg under the tongue every 5 (five) minutes as needed for chest pain.     potassium chloride 10 MEQ tablet  Commonly known as:  K-DUR,KLOR-CON  Take 10 mEq by mouth 2 (two) times daily.     triamcinolone cream  0.1 %  Commonly known as:  KENALOG  Apply 1 application topically 2 (two) times daily.       No Known Allergies     Follow-up Information    Follow up with TAPPER,DAVID B, MD. Schedule an appointment as soon as possible for a visit in 2 weeks.   Specialty:  Family Medicine   Contact information:   [address] [place] [address] [phone]       Please follow up.   Why:  You need to follow up with your eye doctor as soon as possible      Follow up On 12/09/2014.   Why:  2/17 at 2:15       The results of significant diagnostics from this hospitalization (including imaging, microbiology, ancillary and laboratory) are listed below for reference.    Significant Diagnostic Studies: Dg Chest 2 View  12/01/2014   CLINICAL DATA:  Left-sided weakness and slurred speech.  EXAM: CHEST  2 VIEW  COMPARISON:  July 26, 2013.  FINDINGS: The heart size and mediastinal contours are within normal limits. No pneumothorax is noted. Increased interstitial densities are noted in the perihilar and basilar regions concerning for pulmonary edema. Mild left pleural effusion is noted. Increased mild loculated right pleural effusion is noted. The visualized skeletal structures are unremarkable.  IMPRESSION: Probable mild bilateral perihilar and basilar edema with associated mild pleural effusions.   Electronically Signed   By: Marijo Conception, M.D.   On: 12/01/2014 20:33   Ct Head Wo Contrast  12/01/2014   CLINICAL DATA:  64 year old male with slurred speech and left side weakness since 2100 hours. Dense weakness of the left face. Code stroke. Initial encounter.  EXAM: CT HEAD WITHOUT CONTRAST  TECHNIQUE: Contiguous axial images were obtained from the base of the skull through the vertex without intravenous contrast.  COMPARISON:  09/21/2013 and earlier.  FINDINGS: Stable paranasal sinuses and mastoids. No acute osseous abnormality identified. Negative orbit and scalp soft tissues.  Cerebral volume is  stable and within normal limits for age. No ventriculomegaly. No midline shift, mass effect, or evidence of intracranial mass lesion. No evidence of cortically based acute infarction identified. Dominant distal left vertebral artery again suspected. No suspicious intracranial vascular hyperdensity. No acute intracranial hemorrhage identified.  IMPRESSION: Stable and normal for age noncontrast CT appearance of the brain.  Study discussed by telephone with Dr. Joseph Berkshire on 12/01/2014 at 12:01 .   Electronically Signed   By: Genevie Ann M.D.   On: 12/01/2014 12:02   Mr Jodene Nam Head Wo Contrast  12/02/2014   CLINICAL DATA:  [place] at home.  Weakness.  EXAM: MRA HEAD WITHOUT CONTRAST  TECHNIQUE: Angiographic images of  the Circle of Willis were obtained using MRA technique without intravenous contrast.  COMPARISON:  MRI head 12/01/2014  FINDINGS: Left vertebral dominant and widely patent. Right vertebral artery is small. Decreased signal right vertebral artery felt to be due to tortuosity. PICA patent bilaterally. Basilar widely patent. Superior cerebellar and posterior cerebral arteries appear normal  Internal carotid artery widely patent bilaterally. Anterior and middle cerebral arteries widely patent without stenosis  Negative for cerebral aneurysm.  IMPRESSION: Negative   Electronically Signed   By: Franchot Gallo M.D.   On: 12/02/2014 11:27   Mr Brain Wo Contrast  12/01/2014   CLINICAL DATA:  Left facial droop.  Slurred speech.  EXAM: MRI HEAD WITHOUT CONTRAST  TECHNIQUE: Multiplanar, multiecho pulse sequences of the brain and surrounding structures were obtained without intravenous contrast.  COMPARISON:  Head CT 12/01/2014 and MRI 12/03/2003  FINDINGS: There is no evidence of acute infarct, intracranial hemorrhage, mass, midline shift, or extra-axial fluid collection. Mild, age appropriate cerebral atrophy is present. Small foci of T2 hyperintensity in the cerebral white matter are nonspecific but compatible  with minimal chronic small vessel ischemic disease.  Orbits are unremarkable. Paranasal sinuses are clear. There are small bilateral mastoid effusions. Major intracranial vascular flow voids are preserved.  IMPRESSION: 1. No acute intracranial abnormality. 2. Minimal chronic small vessel ischemic disease.   Electronically Signed   By: Logan Bores   On: 12/01/2014 16:12    Microbiology: No results found for this or any previous visit (from the past 240 hour(s)).   Labs: Basic Metabolic Panel:  Recent Labs Lab 12/01/14 1035 12/01/14 1100 12/03/14 0544  NA 137 141 140  K 3.6 3.6 3.8  CL 103 101 103  CO2 30  --  27  GLUCOSE 226* 215* 144*  BUN 9 8 17   CREATININE 1.12 0.90 1.03  CALCIUM 9.0  --  9.3   Liver Function Tests:  Recent Labs Lab 12/01/14 1035  AST 33  ALT 46  ALKPHOS 55  BILITOT 2.2*  PROT 7.3  ALBUMIN 4.1   No results for input(s): LIPASE, AMYLASE in the last 168 hours. No results for input(s): AMMONIA in the last 168 hours. CBC:  Recent Labs Lab 12/01/14 1035 12/01/14 1100 12/03/14 0544  WBC 7.0  --  7.7  NEUTROABS 4.5  --   --   HGB 15.8 17.0 15.3  HCT 44.5 50.0 44.6  MCV 86.1  --  86.6  PLT 201  --  209   Cardiac Enzymes:  Recent Labs Lab 12/02/14 1216  CKTOTAL 70  CKMB 2.7   BNP: BNP (last 3 results) No results for input(s): BNP in the last 8760 hours.  ProBNP (last 3 results) No results for input(s): PROBNP in the last 8760 hours.  CBG:  Recent Labs Lab 12/02/14 1145 12/02/14 1647 12/02/14 2118 12/03/14 0753 12/03/14 1146  GLUCAP 165* 168* 171* 149* 203*       Signed:  HERNANDEZ ACOSTA,ESTELA  [facility] Pager: [phone] 12/03/2014, 6:27 PM

## 2014-12-03 NOTE — Progress Notes (Signed)
Discharge instructions reviewed with patient. Understanding verbalized. Awaiting ride for discharge home.

## 2014-12-03 NOTE — Progress Notes (Addendum)
Patient ID: [name] [name], male   DOB: 1950/12/09, 64 y.o.   MRN: [id number]  [place] A. Merlene Laughter, MD     [email or web address]          [name] [name] is an 64 y.o. male.   Assessment/Plan: 1. Acute syncope of unclear etiology. Imaging argues against this cute ischemic stroke. TIA is unlikely although possible. 2. Acute gait impairment. This is likely multifactorial. 3. Long-standing history of mild cognitive impairment/memory impairment. 4. Long-standing gait impairment with spasms of unclear etiology. 5. Obstructive sleep apnea syndrome. 6. Dyslipidemia. 7. DM.   RECOMMENDATIONS: 1. Repeat dementia labs including RPR and HIV. 2. Limited labs for neuromuscular diseases including CPK and acetylcholine receptor antibodies. 3. EEG. 4. Physical and occupational therapy. 5. Increase aspirin to 81 mg 2 tablets daily.  Doing about the same. So far above W/U unrevealing. Will add T AND C SPINE MRI.  GENERAL: Pleasant in no acute distress.  HEENT: Supple. Left exotropia. Left pupil is semi- elliptically shaped and minimal reactive. Right is round and reactive. There is a relative apherrent pupillary defect on the left.  ABDOMEN: soft  EXTREMITIES: No edema   BACK: Normal.  SKIN: Normal by inspection.   MENTAL STATUS: Alert and oriented. Speech, language and cognition are generally intact. Judgment and insight normal. He recognized that he has seen me in the past.  CRANIAL NERVES: The extra ocular movements are full although there appears to be some apraxia of upper word eye movements at times, there is no significant nystagmus; visual fields are full; upper and lower facial muscles are normal in strength and symmetric, there is no flattening of the nasolabial folds; tongue is midline; uvula is midline; shoulder elevation is normal.  MOTOR: Normal tone, bulk and strength- except the right lower extremity which appears to be in significant pain with  strength graded as 4/5; no pronator drift.  COORDINATION: Left finger to nose is normal, right finger to nose is normal, No rest tremor; no intention tremor; no postural tremor; no bradykinesia.  REFLEXES: Deep tendon reflexes are symmetrical and normal. Babinski reflexes are flexor bilaterally.   SENSATION: Normal to light touch.       Objective: Vital signs in last 24 hours: Temp:  [97.5 F (36.4 C)-98.1 F (36.7 C)] 98 F (36.7 C) (02/11 0609) Pulse Rate:  [58-103] 58 (02/11 0609) Resp:  [17-18] 18 (02/11 0609) BP: (120-152)/(66-89) 147/84 mmHg (02/11 0609) SpO2:  [97 %-100 %] 98 % (02/11 0733) Weight:  [92.806 kg (204 lb 9.6 oz)] 92.806 kg (204 lb 9.6 oz) (02/10 2017)  Intake/Output from previous day: 02/10 0701 - 02/11 0700 In: 480 [P.O.:480] Out: 600 [Urine:600] Intake/Output this shift:   Nutritional status: Diet Carb Modified   Lab Results: Results for orders placed or performed during the hospital encounter of 12/01/14 (from the past 48 hour(s))  TSH     Status: None   Collection Time: 12/01/14 10:30 AM  Result Value Ref Range   TSH 2.746 0.350 - 4.500 uIU/mL  CBC with Differential     Status: None   Collection Time: 12/01/14 10:35 AM  Result Value Ref Range   WBC 7.0 4.0 - 10.5 K/uL   RBC 5.17 4.22 - 5.81 MIL/uL   Hemoglobin 15.8 13.0 - 17.0 g/dL   HCT 44.5 39.0 - 52.0 %   MCV 86.1 78.0 - 100.0 fL   MCH 30.6 26.0 - 34.0 pg   MCHC 35.5 30.0 - 36.0 g/dL   RDW 13.5  11.5 - 15.5 %   Platelets 201 150 - 400 K/uL   Neutrophils Relative % 64 43 - 77 %   Neutro Abs 4.5 1.7 - 7.7 K/uL   Lymphocytes Relative 25 12 - 46 %   Lymphs Abs 1.8 0.7 - 4.0 K/uL   Monocytes Relative 8 3 - 12 %   Monocytes Absolute 0.5 0.1 - 1.0 K/uL   Eosinophils Relative 2 0 - 5 %   Eosinophils Absolute 0.2 0.0 - 0.7 K/uL   Basophils Relative 1 0 - 1 %   Basophils Absolute 0.0 0.0 - 0.1 K/uL  Comprehensive metabolic panel     Status: Abnormal   Collection Time: 12/01/14 10:35 AM    Result Value Ref Range   Sodium 137 135 - 145 mmol/L   Potassium 3.6 3.5 - 5.1 mmol/L   Chloride 103 96 - 112 mmol/L   CO2 30 19 - 32 mmol/L   Glucose, Bld 226 (H) 70 - 99 mg/dL   BUN 9 6 - 23 mg/dL   Creatinine, Ser 1.12 0.50 - 1.35 mg/dL   Calcium 9.0 8.4 - 10.5 mg/dL   Total Protein 7.3 6.0 - 8.3 g/dL   Albumin 4.1 3.5 - 5.2 g/dL   AST 33 0 - 37 U/L   ALT 46 0 - 53 U/L   Alkaline Phosphatase 55 39 - 117 U/L   Total Bilirubin 2.2 (H) 0.3 - 1.2 mg/dL   GFR calc non Af Amer 69 (L) >90 mL/min   GFR calc Af Amer 80 (L) >90 mL/min    Comment: (NOTE) The eGFR has been calculated using the CKD EPI equation. This calculation has not been validated in all clinical situations. eGFR's persistently <90 mL/min signify possible Chronic Kidney Disease.    Anion gap 4 (L) 5 - 15  Protime-INR     Status: None   Collection Time: 12/01/14 10:35 AM  Result Value Ref Range   Prothrombin Time 13.4 11.6 - 15.2 seconds   INR 1.01 0.00 - 1.49  APTT     Status: None   Collection Time: 12/01/14 10:35 AM  Result Value Ref Range   aPTT 28 24 - 37 seconds  Ethanol     Status: None   Collection Time: 12/01/14 10:38 AM  Result Value Ref Range   Alcohol, Ethyl (B) <5 0 - 9 mg/dL    Comment:        LOWEST DETECTABLE LIMIT FOR SERUM ALCOHOL IS 11 mg/dL FOR MEDICAL PURPOSES ONLY   I-Stat Troponin, ED (not at [facility])     Status: None   Collection Time: 12/01/14 10:58 AM  Result Value Ref Range   Troponin i, poc 0.00 0.00 - 0.08 ng/mL   Comment 3            Comment: Due to the release kinetics of cTnI, a negative result within the first hours of the onset of symptoms does not rule out myocardial infarction with certainty. If myocardial infarction is still suspected, repeat the test at appropriate intervals.   I-Stat Chem 8, ED     Status: Abnormal   Collection Time: 12/01/14 11:00 AM  Result Value Ref Range   Sodium 141 135 - 145 mmol/L   Potassium 3.6 3.5 - 5.1 mmol/L   Chloride 101 96 - 112  mmol/L   BUN 8 6 - 23 mg/dL   Creatinine, Ser 0.90 0.50 - 1.35 mg/dL   Glucose, Bld 215 (H) 70 - 99 mg/dL   Calcium, Ion  1.21 1.13 - 1.30 mmol/L   TCO2 25 0 - 100 mmol/L   Hemoglobin 17.0 13.0 - 17.0 g/dL   HCT 50.0 39.0 - 52.0 %  Urine Drug Screen     Status: None   Collection Time: 12/01/14 11:00 AM  Result Value Ref Range   Opiates NONE DETECTED NONE DETECTED   Cocaine NONE DETECTED NONE DETECTED   Benzodiazepines NONE DETECTED NONE DETECTED   Amphetamines NONE DETECTED NONE DETECTED   Tetrahydrocannabinol NONE DETECTED NONE DETECTED   Barbiturates NONE DETECTED NONE DETECTED    Comment:        DRUG SCREEN FOR MEDICAL PURPOSES ONLY.  IF CONFIRMATION IS NEEDED FOR ANY PURPOSE, NOTIFY LAB WITHIN 5 DAYS.        LOWEST DETECTABLE LIMITS FOR URINE DRUG SCREEN Drug Class       Cutoff (ng/mL) Amphetamine      1000 Barbiturate      200 Benzodiazepine   315 Tricyclics       400 Opiates          300 Cocaine          300 THC              50   Urinalysis, Routine w reflex microscopic     Status: Abnormal   Collection Time: 12/01/14 11:00 AM  Result Value Ref Range   Color, Urine YELLOW YELLOW   APPearance CLEAR CLEAR   Specific Gravity, Urine 1.010 1.005 - 1.030   pH 6.0 5.0 - 8.0   Glucose, UA 250 (A) NEGATIVE mg/dL   Hgb urine dipstick NEGATIVE NEGATIVE   Bilirubin Urine NEGATIVE NEGATIVE   Ketones, ur NEGATIVE NEGATIVE mg/dL   Protein, ur NEGATIVE NEGATIVE mg/dL   Urobilinogen, UA 0.2 0.0 - 1.0 mg/dL   Nitrite NEGATIVE NEGATIVE   Leukocytes, UA NEGATIVE NEGATIVE    Comment: MICROSCOPIC NOT DONE ON URINES WITH NEGATIVE PROTEIN, BLOOD, LEUKOCYTES, NITRITE, OR GLUCOSE <1000 mg/dL.  Glucose, capillary     Status: Abnormal   Collection Time: 12/01/14  4:32 PM  Result Value Ref Range   Glucose-Capillary 138 (H) 70 - 99 mg/dL   Comment 1 Notify RN    Comment 2 Document in Chart   Glucose, capillary     Status: Abnormal   Collection Time: 12/01/14  9:32 PM  Result Value  Ref Range   Glucose-Capillary 143 (H) 70 - 99 mg/dL   Comment 1 Notify RN   Hemoglobin A1c     Status: Abnormal   Collection Time: 12/01/14  9:42 PM  Result Value Ref Range   Hgb A1c MFr Bld 7.6 (H) 4.8 - 5.6 %    Comment: (NOTE)         Pre-diabetes: 5.7 - 6.4         Diabetes: >6.4         Glycemic control for adults with diabetes: <7.0    Mean Plasma Glucose 171 mg/dL    Comment: (NOTE) Performed At: [facility] [place], [place] [id number] Lindon Romp MD TO:[id number]   Lipid panel     Status: Abnormal   Collection Time: 12/02/14  6:42 AM  Result Value Ref Range   Cholesterol 132 0 - 200 mg/dL   Triglycerides 229 (H) <150 mg/dL   HDL 22 (L) >39 mg/dL   Total CHOL/HDL Ratio 6.0 RATIO   VLDL 46 (H) 0 - 40 mg/dL   LDL Cholesterol 64 0 - 99 mg/dL    Comment:  Total Cholesterol/HDL:CHD Risk Coronary Heart Disease Risk Table                     Men   Women  1/2 Average Risk   3.4   3.3  Average Risk       5.0   4.4  2 X Average Risk   9.6   7.1  3 X Average Risk  23.4   11.0        Use the calculated Patient Ratio above and the CHD Risk Table to determine the patient's CHD Risk.        ATP III CLASSIFICATION (LDL):  <100     mg/dL   Optimal  100-129  mg/dL   Near or Above                    Optimal  130-159  mg/dL   Borderline  160-189  mg/dL   High  >190     mg/dL   Very High   Glucose, capillary     Status: Abnormal   Collection Time: 12/02/14  7:34 AM  Result Value Ref Range   Glucose-Capillary 150 (H) 70 - 99 mg/dL   Comment 1 Notify RN    Comment 2 Document in Chart   Glucose, capillary     Status: Abnormal   Collection Time: 12/02/14 11:45 AM  Result Value Ref Range   Glucose-Capillary 165 (H) 70 - 99 mg/dL   Comment 1 Notify RN    Comment 2 Document in Chart   RPR     Status: None   Collection Time: 12/02/14 12:16 PM  Result Value Ref Range   RPR Ser Ql Non Reactive Non Reactive    Comment: (NOTE) Performed At: [facility] [place], [place] [id number] Lindon Romp MD EG:[id number]   Vitamin B12     Status: None   Collection Time: 12/02/14 12:16 PM  Result Value Ref Range   Vitamin B-12 381 211 - 911 pg/mL    Comment: Performed at [facility]  CK total and CKMB (cardiac)     Status: None   Collection Time: 12/02/14 12:16 PM  Result Value Ref Range   Total CK 70 7 - 232 U/L   CK, MB 2.7 0.3 - 4.0 ng/mL   Relative Index RELATIVE INDEX IS INVALID 0.0 - 2.5    Comment: WHEN CK < 100 U/L        Performed at [facility]   Glucose, capillary     Status: Abnormal   Collection Time: 12/02/14  4:47 PM  Result Value Ref Range   Glucose-Capillary 168 (H) 70 - 99 mg/dL   Comment 1 Notify RN    Comment 2 Document in Chart   Glucose, capillary     Status: Abnormal   Collection Time: 12/02/14  9:18 PM  Result Value Ref Range   Glucose-Capillary 171 (H) 70 - 99 mg/dL  CBC     Status: None   Collection Time: 12/03/14  5:44 AM  Result Value Ref Range   WBC 7.7 4.0 - 10.5 K/uL   RBC 5.15 4.22 - 5.81 MIL/uL   Hemoglobin 15.3 13.0 - 17.0 g/dL   HCT 44.6 39.0 - 52.0 %   MCV 86.6 78.0 - 100.0 fL   MCH 29.7 26.0 - 34.0 pg   MCHC 34.3 30.0 - 36.0 g/dL   RDW 14.1 11.5 - 15.5 %   Platelets 209 150 -  400 K/uL  Basic metabolic panel     Status: Abnormal   Collection Time: 12/03/14  5:44 AM  Result Value Ref Range   Sodium 140 135 - 145 mmol/L   Potassium 3.8 3.5 - 5.1 mmol/L   Chloride 103 96 - 112 mmol/L   CO2 27 19 - 32 mmol/L   Glucose, Bld 144 (H) 70 - 99 mg/dL   BUN 17 6 - 23 mg/dL   Creatinine, Ser 1.03 0.50 - 1.35 mg/dL   Calcium 9.3 8.4 - 10.5 mg/dL   GFR calc non Af Amer 76 (L) >90 mL/min   GFR calc Af Amer 89 (L) >90 mL/min    Comment: (NOTE) The eGFR has been calculated using the CKD EPI equation. This calculation has not been validated in all clinical situations. eGFR's persistently <90 mL/min signify possible Chronic Kidney Disease.    Anion  gap 10 5 - 15  Glucose, capillary     Status: Abnormal   Collection Time: 12/03/14  7:53 AM  Result Value Ref Range   Glucose-Capillary 149 (H) 70 - 99 mg/dL    Lipid Panel  Recent Labs  12/02/14 0642  CHOL 132  TRIG 229*  HDL 22*  CHOLHDL 6.0  VLDL 46*  LDLCALC 64    Studies/Results:   Medications:  Scheduled Meds: . aspirin  325 mg Oral Daily  . enoxaparin (LOVENOX) injection  40 mg Subcutaneous Q24H  . gabapentin  300 mg Oral TID  . insulin aspart  0-5 Units Subcutaneous QHS  . insulin aspart  0-9 Units Subcutaneous TID WC  . lisinopril  20 mg Oral Daily  . metoprolol succinate  100 mg Oral Daily  . pantoprazole  40 mg Oral Q0600   Continuous Infusions:  PRN Meds:.acetaminophen, ALPRAZolam, nitroGLYCERIN, senna-docusate, traMADol     LOS: 2 days   Chadrick Sprinkle A. Merlene Laughter, M.D.  Diplomate, Tax adviser of Psychiatry and Neurology ( Neurology).

## 2014-12-03 NOTE — Progress Notes (Signed)
Patients heart rate has been between 58 and 64 this morning. Due 100 mg metoprolol. Physician paged. Will hold medicine until discussed with physician.

## 2014-12-10 LAB — ACETYLCHOLINE RECEPTOR AB, ALL
Acetylchol Block Ab: <15 % of inhibition (ref <15)
Acetylcholine Modulat Ab: 20 %

## 2015-05-03 ENCOUNTER — Observation Stay (HOSPITAL_COMMUNITY): Payer: Medicaid Other

## 2015-05-03 ENCOUNTER — Emergency Department (HOSPITAL_COMMUNITY): Payer: Medicaid Other

## 2015-05-03 ENCOUNTER — Inpatient Hospital Stay (HOSPITAL_COMMUNITY)
Admission: EM | Admit: 2015-05-03 | Discharge: 2015-05-05 | DRG: 071 | Disposition: A | Discharge status: Home / Self Care | Payer: Medicaid Other | Attending: Internal Medicine | Admitting: Internal Medicine

## 2015-05-03 ENCOUNTER — Encounter (HOSPITAL_COMMUNITY): Payer: Self-pay | Admitting: *Deleted

## 2015-05-03 DIAGNOSIS — G4733 Obstructive sleep apnea (adult) (pediatric): Secondary | ICD-10-CM | POA: Diagnosis present

## 2015-05-03 DIAGNOSIS — Z8249 Family history of ischemic heart disease and other diseases of the circulatory system: Secondary | ICD-10-CM

## 2015-05-03 DIAGNOSIS — R29898 Other symptoms and signs involving the musculoskeletal system: Secondary | ICD-10-CM | POA: Diagnosis not present

## 2015-05-03 DIAGNOSIS — R4701 Aphasia: Secondary | ICD-10-CM | POA: Insufficient documentation

## 2015-05-03 DIAGNOSIS — F419 Anxiety disorder, unspecified: Secondary | ICD-10-CM | POA: Diagnosis present

## 2015-05-03 DIAGNOSIS — I10 Essential (primary) hypertension: Secondary | ICD-10-CM | POA: Diagnosis present

## 2015-05-03 DIAGNOSIS — Z833 Family history of diabetes mellitus: Secondary | ICD-10-CM

## 2015-05-03 DIAGNOSIS — R531 Weakness: Secondary | ICD-10-CM

## 2015-05-03 DIAGNOSIS — H532 Diplopia: Secondary | ICD-10-CM | POA: Diagnosis present

## 2015-05-03 DIAGNOSIS — G4700 Insomnia, unspecified: Secondary | ICD-10-CM | POA: Diagnosis present

## 2015-05-03 DIAGNOSIS — E119 Type 2 diabetes mellitus without complications: Secondary | ICD-10-CM | POA: Diagnosis not present

## 2015-05-03 DIAGNOSIS — Z8673 Personal history of transient ischemic attack (TIA), and cerebral infarction without residual deficits: Secondary | ICD-10-CM

## 2015-05-03 DIAGNOSIS — H5442 Blindness, left eye, normal vision right eye: Secondary | ICD-10-CM | POA: Diagnosis present

## 2015-05-03 DIAGNOSIS — E1165 Type 2 diabetes mellitus with hyperglycemia: Secondary | ICD-10-CM | POA: Diagnosis present

## 2015-05-03 DIAGNOSIS — G9340 Encephalopathy, unspecified: Principal | ICD-10-CM | POA: Diagnosis present

## 2015-05-03 DIAGNOSIS — F329 Major depressive disorder, single episode, unspecified: Secondary | ICD-10-CM | POA: Diagnosis present

## 2015-05-03 DIAGNOSIS — Z820 Family history of epilepsy and other diseases of the nervous system: Secondary | ICD-10-CM

## 2015-05-03 DIAGNOSIS — K219 Gastro-esophageal reflux disease without esophagitis: Secondary | ICD-10-CM | POA: Diagnosis present

## 2015-05-03 DIAGNOSIS — Z801 Family history of malignant neoplasm of trachea, bronchus and lung: Secondary | ICD-10-CM

## 2015-05-03 LAB — COMPREHENSIVE METABOLIC PANEL
ALT: 53 U/L (ref 17–63)
AST: 40 U/L (ref 15–41)
Albumin: 4.1 g/dL (ref 3.5–5.0)
Alkaline Phosphatase: 62 U/L (ref 38–126)
Anion gap: 9 (ref 5–15)
BILIRUBIN TOTAL: 2.5 mg/dL — AB (ref 0.3–1.2)
BUN: 13 mg/dL (ref 6–20)
CHLORIDE: 101 mmol/L (ref 101–111)
CO2: 28 mmol/L (ref 22–32)
CREATININE: 1.22 mg/dL (ref 0.61–1.24)
Calcium: 9 mg/dL (ref 8.9–10.3)
GFR calc Af Amer: >60 mL/min (ref >60)
GFR calc non Af Amer: >60 mL/min (ref >60)
GLUCOSE: 308 mg/dL — AB (ref 65–99)
POTASSIUM: 3.9 mmol/L (ref 3.5–5.1)
SODIUM: 138 mmol/L (ref 135–145)
TOTAL PROTEIN: 7.3 g/dL (ref 6.5–8.1)

## 2015-05-03 LAB — I-STAT TROPONIN, ED: TROPONIN I, POC: 0 ng/mL (ref 0.00–0.08)

## 2015-05-03 LAB — DIFFERENTIAL
BASOS ABS: 0 10*3/uL (ref 0.0–0.1)
BASOS PCT: 0 % (ref 0–1)
EOS ABS: 0.1 10*3/uL (ref 0.0–0.7)
Eosinophils Relative: 2 % (ref 0–5)
LYMPHS ABS: 1.7 10*3/uL (ref 0.7–4.0)
Lymphocytes Relative: 25 % (ref 12–46)
Monocytes Absolute: 0.4 10*3/uL (ref 0.1–1.0)
Monocytes Relative: 6 % (ref 3–12)
NEUTROS PCT: 67 % (ref 43–77)
Neutro Abs: 4.7 10*3/uL (ref 1.7–7.7)

## 2015-05-03 LAB — URINALYSIS, ROUTINE W REFLEX MICROSCOPIC
Bilirubin Urine: NEGATIVE
Glucose, UA: >1000 mg/dL — AB
Ketones, ur: NEGATIVE mg/dL
LEUKOCYTES UA: NEGATIVE
Nitrite: NEGATIVE
SPECIFIC GRAVITY, URINE: 1.01 (ref 1.005–1.030)
Urobilinogen, UA: 0.2 mg/dL (ref 0.0–1.0)
pH: 6.5 (ref 5.0–8.0)

## 2015-05-03 LAB — GLUCOSE, CAPILLARY
GLUCOSE-CAPILLARY: 152 mg/dL — AB (ref 65–99)
Glucose-Capillary: 191 mg/dL — ABNORMAL HIGH (ref 65–99)

## 2015-05-03 LAB — CBC
HEMATOCRIT: 45.8 % (ref 39.0–52.0)
HEMOGLOBIN: 16.2 g/dL (ref 13.0–17.0)
MCH: 30.1 pg (ref 26.0–34.0)
MCHC: 35.4 g/dL (ref 30.0–36.0)
MCV: 85 fL (ref 78.0–100.0)
Platelets: 184 10*3/uL (ref 150–400)
RBC: 5.39 MIL/uL (ref 4.22–5.81)
RDW: 13.7 % (ref 11.5–15.5)
WBC: 7 10*3/uL (ref 4.0–10.5)

## 2015-05-03 LAB — LIPID PANEL
CHOLESTEROL: 159 mg/dL (ref 0–200)
HDL: 28 mg/dL — ABNORMAL LOW (ref >40)
LDL CALC: UNDETERMINED mg/dL (ref 0–99)
Total CHOL/HDL Ratio: 5.7 RATIO
Triglycerides: 405 mg/dL — ABNORMAL HIGH (ref <150)
VLDL: UNDETERMINED mg/dL (ref 0–40)

## 2015-05-03 LAB — TROPONIN I
Troponin I: <0.03 ng/mL (ref <0.031)
Troponin I: <0.03 ng/mL (ref <0.031)

## 2015-05-03 LAB — I-STAT CHEM 8, ED
BUN: 13 mg/dL (ref 6–20)
Calcium, Ion: 1.19 mmol/L (ref 1.13–1.30)
Chloride: 99 mmol/L — ABNORMAL LOW (ref 101–111)
Creatinine, Ser: 1.2 mg/dL (ref 0.61–1.24)
Glucose, Bld: 309 mg/dL — ABNORMAL HIGH (ref 65–99)
HCT: 50 % (ref 39.0–52.0)
Hemoglobin: 17 g/dL (ref 13.0–17.0)
Potassium: 3.9 mmol/L (ref 3.5–5.1)
Sodium: 139 mmol/L (ref 135–145)
TCO2: 25 mmol/L (ref 0–100)

## 2015-05-03 LAB — RAPID URINE DRUG SCREEN, HOSP PERFORMED
AMPHETAMINES: NOT DETECTED
BARBITURATES: NOT DETECTED
Benzodiazepines: NOT DETECTED
Cocaine: NOT DETECTED
Opiates: NOT DETECTED
TETRAHYDROCANNABINOL: NOT DETECTED

## 2015-05-03 LAB — URINE MICROSCOPIC-ADD ON

## 2015-05-03 LAB — CBG MONITORING, ED: Glucose-Capillary: 305 mg/dL — ABNORMAL HIGH (ref 65–99)

## 2015-05-03 LAB — APTT: APTT: 29 s (ref 24–37)

## 2015-05-03 LAB — ETHANOL: Alcohol, Ethyl (B): <5 mg/dL (ref <5)

## 2015-05-03 LAB — VITAMIN B12: VITAMIN B 12: 268 pg/mL (ref 180–914)

## 2015-05-03 LAB — PROTIME-INR
INR: 1.08 (ref 0.00–1.49)
PROTHROMBIN TIME: 14.2 s (ref 11.6–15.2)

## 2015-05-03 LAB — TSH: TSH: 2.379 u[IU]/mL (ref 0.350–4.500)

## 2015-05-03 MED ORDER — ACETAMINOPHEN 325 MG PO TABS: 650.0000 mg | ORAL_TABLET | Freq: Four times a day (QID) | ORAL | Status: DC | PRN

## 2015-05-03 MED ORDER — ONDANSETRON HCL 4 MG/2ML IJ SOLN: 4.0000 mg | Freq: Four times a day (QID) | INTRAMUSCULAR | Status: DC | PRN

## 2015-05-03 MED ORDER — TRAZODONE HCL 50 MG PO TABS
25.0000 mg | ORAL_TABLET | Freq: Every evening | ORAL | Status: DC | PRN
  Administered 2015-05-03: 25 mg via ORAL
  Filled 2015-05-03 (×2): qty 1

## 2015-05-03 MED ORDER — HYDROCODONE-ACETAMINOPHEN 5-325 MG PO TABS: 1.0000 | ORAL_TABLET | ORAL | Status: DC | PRN

## 2015-05-03 MED ORDER — SENNOSIDES-DOCUSATE SODIUM 8.6-50 MG PO TABS: 1.0000 | ORAL_TABLET | Freq: Every evening | ORAL | Status: DC | PRN

## 2015-05-03 MED ORDER — ENSURE ENLIVE PO LIQD
237.0000 mL | Freq: Two times a day (BID) | ORAL | Status: DC
  Administered 2015-05-04 – 2015-05-05 (×2): 237 mL via ORAL

## 2015-05-03 MED ORDER — GADOBENATE DIMEGLUMINE 529 MG/ML IV SOLN
19.0000 mL | Freq: Once | INTRAVENOUS | Status: AC | PRN
  Administered 2015-05-03: 19 mL via INTRAVENOUS

## 2015-05-03 MED ORDER — BISACODYL 10 MG RE SUPP: 10.0000 mg | Freq: Every day | RECTAL | Status: DC | PRN

## 2015-05-03 MED ORDER — MAGNESIUM CITRATE PO SOLN: 1.0000 | Freq: Once | ORAL | Status: AC | PRN

## 2015-05-03 MED ORDER — FUROSEMIDE 20 MG PO TABS
20.0000 mg | ORAL_TABLET | Freq: Every day | ORAL | Status: DC
  Administered 2015-05-04 – 2015-05-05 (×2): 20 mg via ORAL
  Filled 2015-05-03 (×2): qty 1

## 2015-05-03 MED ORDER — SODIUM CHLORIDE 0.9 % IJ SOLN
3.0000 mL | Freq: Two times a day (BID) | INTRAMUSCULAR | Status: DC
  Administered 2015-05-03 – 2015-05-05 (×4): 3 mL via INTRAVENOUS

## 2015-05-03 MED ORDER — ONDANSETRON HCL 4 MG PO TABS: 4.0000 mg | ORAL_TABLET | Freq: Four times a day (QID) | ORAL | Status: DC | PRN

## 2015-05-03 MED ORDER — ASPIRIN 325 MG PO TABS
325.0000 mg | ORAL_TABLET | Freq: Every day | ORAL | Status: DC
  Administered 2015-05-04 – 2015-05-05 (×2): 325 mg via ORAL
  Filled 2015-05-03 (×3): qty 1

## 2015-05-03 MED ORDER — SODIUM CHLORIDE 0.9 % IV SOLN
INTRAVENOUS | Status: DC
  Administered 2015-05-03: 18:00:00 via INTRAVENOUS

## 2015-05-03 MED ORDER — METOPROLOL SUCCINATE ER 50 MG PO TB24
100.0000 mg | ORAL_TABLET | Freq: Every day | ORAL | Status: DC
  Administered 2015-05-04 – 2015-05-05 (×2): 100 mg via ORAL
  Filled 2015-05-03 (×3): qty 2

## 2015-05-03 MED ORDER — POTASSIUM CHLORIDE CRYS ER 10 MEQ PO TBCR
10.0000 meq | EXTENDED_RELEASE_TABLET | Freq: Two times a day (BID) | ORAL | Status: DC
  Administered 2015-05-03: 10 meq via ORAL
  Filled 2015-05-03: qty 1

## 2015-05-03 MED ORDER — ENOXAPARIN SODIUM 40 MG/0.4ML ~~LOC~~ SOLN
40.0000 mg | SUBCUTANEOUS | Status: DC
  Administered 2015-05-03 – 2015-05-05 (×3): 40 mg via SUBCUTANEOUS
  Filled 2015-05-03 (×3): qty 0.4

## 2015-05-03 MED ORDER — ALUM & MAG HYDROXIDE-SIMETH 200-200-20 MG/5ML PO SUSP: 30.0000 mL | Freq: Four times a day (QID) | ORAL | Status: DC | PRN

## 2015-05-03 MED ORDER — ACETAMINOPHEN 650 MG RE SUPP: 650.0000 mg | Freq: Four times a day (QID) | RECTAL | Status: DC | PRN

## 2015-05-03 MED ORDER — CETYLPYRIDINIUM CHLORIDE 0.05 % MT LIQD
7.0000 mL | Freq: Two times a day (BID) | OROMUCOSAL | Status: DC
  Administered 2015-05-03 – 2015-05-05 (×3): 7 mL via OROMUCOSAL

## 2015-05-03 MED ORDER — INSULIN ASPART 100 UNIT/ML ~~LOC~~ SOLN
0.0000 [IU] | Freq: Three times a day (TID) | SUBCUTANEOUS | Status: DC
  Administered 2015-05-03: 3 [IU] via SUBCUTANEOUS
  Administered 2015-05-04 (×2): 5 [IU] via SUBCUTANEOUS

## 2015-05-03 MED ORDER — INSULIN ASPART 100 UNIT/ML ~~LOC~~ SOLN: 0.0000 [IU] | Freq: Every day | SUBCUTANEOUS | Status: DC

## 2015-05-03 NOTE — Progress Notes (Signed)
11:32am call from [place], RN, code stroke 5 minutes out. 11:34 am call from Clarene Critchley, Network engineer, code stoke almost here, will beep [place] when here. 11:37am beeper goes off, 64 year old male, no room number indicated. 11:38 am called ER, Clarene Critchley, Network engineer, patient just arrived, going to room 4. 11:45am, patient brought to CT by Lidia Collum, RN and Jonelle Sidle, RN and EMS guys. 11:50am patient removed from Ct by Lidia Collum, RN and Tiffany, RN. 11:53am, called Cassandra at [facility] to tell her about code stroke.

## 2015-05-03 NOTE — ED Notes (Signed)
Lab at bedside

## 2015-05-03 NOTE — Consult Note (Signed)
[place] A. Merlene Laughter, MD     [email or web address]          [name] [name] is an 64 y.o. male.   ASSESSMENT/PLAN:   1. This patient has multiple symptoms without a clear unifying diagnosis. He has presented this way for multiple years. He does seem to have hard findings on examination however including an extensor plantar reflex on the right with increased tone in right leg weakness. This raises the possibility for cervical or thoracic spine myelopathy. 2. Blackout spells of unclear etiology. 3. Left facial weakness and dysarthria of unclear etiology but could be due to focal symptoms from hyperglycemia. 4. Poorly controlled diabetes mellitus. 5. Hypertension. 6. Likely underlying depression.   The patient has extensive labs which have been obtained. There is also following. Cervical spine and the thoracic spine MRI.     The patient is a 64 year old white male who I previously saw in the clinic many years ago. He was last seen in 2007. The patient has had multiple symptoms for which she was being followed at that time. He had an extensive workup for his symptoms at that time and the most pertinent finding was a T8 T9 herniated disc causing dictation of the thecal sac on MRI imaging. There was some concern that the disc was causing some encroachment on the spinal cord although the patient did have a myelogram which was not as impressive as the MRI findings. Surgery was not carried out. The patient was having spasms and was felt to have some evidence of clinical myelopathy due to his leg spasms. He also had single episodes. He was worked up with EEG and MRI of the brain which were all unrevealing. In fact, I did copy my workup and the wall to the prior notes in the chart below. The patient was lost to follow-up and was actually told to 2 follow up in our clinic on an as-needed basis. He presented about 4 months ago to this hospital with left facial numbness. Imaging of the  time is outlined below and was unrevealing for anything acute. The chart indicates that he presented with similar symptoms of left facial weakness and dysarthria. The patient is very evasive about why he is admitted to the hospital was seen to be focusing mostly on weakness of the right leg (It appears he is a poor historian). He tells me that his right leg has been weak ever since he was evaluated here 4 months ago. I did review the workup at that time and imaging is normal. He did have some hyperglycemia but only as high as 240. The patient endorses having some difficulty with bladder and bowel control although this appears to be moderate in severity. He reports difficulties ambulating over the last several months. He tells me he is still having black out spells.   GENERAL: Patient is in no acute distress but seemed to be in discomfort.  HEENT: Supple. Atraumatic normocephalic.   ABDOMEN: soft  EXTREMITIES: No edema   BACK: Normal.  SKIN: Normal by inspection.    MENTAL STATUS: Alert and oriented. Speech, language and cognition are generally fair. Judgment and insight Somewhat limited however. Affect is quite restricted flat.  CRANIAL NERVES: Pupils - Left large about 6 mm irregularly shaped and nonreactive; right 3 mma re equal, round and reactive to light; extra ocular movements are full, there is no significant nystagmus; visual fields are full- R , but Blind on the left; there is no flattening of the  nasolabial folds; tongue is midline; uvula is midline; shoulder elevation is normal. There is marked ptosis on the left side.  MOTOR: Normal tone, bulk and strength arms; no pronator drift. Left lower extremity hip flexion 4+/5 and dorsiflexion 3/5. Bulk is normal but tone is increased. Right lower extremity hip flexion 2/5 and dorsiflexion also 2/5. Tone is increased.  COORDINATION: Left finger to nose is normal, right finger to nose is normal, No rest tremor; no intention tremor; no postural  tremor; no bradykinesia.  REFLEXES: Deep tendon reflexes are symmetrical and normal in the arms but brisk in the legs. Babinski reflexes are flexor on the left but extensor on the right.   SENSATION: Unreliable but he seemed to have good response to painful some light in the lower extremities.     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 Anxiety disorder. 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 Insomnia (Sleep disturbances)  #780.50.   Obstructive sleep apnea #780.53.  MEDICATIONS: Lisinopril 40 mg 1 am Fluid tabs  Valium 5 mg 1 twice daily  HISTORY OF PRESENT ILLNESS: Primary care physician:  Dr. Scotty Court Patient here for EEG results and prescription refills. The patient indicates that he has not had any recurrent blackout spells. Spasm seems controlled.  SURGERIES: Tonsils, hernia, drain fluid off neck  ALLERGIES: none known  TOBACCO: Denies  INTERVENTIONS: 11/2003: BRAIN MRI STUDY IS WITHIN NORMAL LIMITS. THE PREVIOUSLY DESCRIBED PERIVENTRICULAR AND SUBCORTICAL WHITE MATTER LESIONS ARE NOT EVIDENT ON THE CURRENT STUDY.  Brain MRI done February 2004 shows two small increased signal intensity in the periventricular region.  EEG 07/2005: This is normal recording of the awake and drowsy states.   EEG 05/2005: This is normal recording of the awake and drowsy states.  Electromyography 2004 legs: This study is normal. There is no electrodiagnostic evidence of neuropathy, radiculopathy or myopathy.  Electromyography arms: There is  electrodiagnostic evidence of the following: 1. 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 2. 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  SOCIAL HISTORY: 64 year old male married.  OBJECTIVE: VS: BP: 118/072. P: 060. RR: 20.  ASSESSMENT: Blackouts spells. Leg spasms controlled.  MEDICATION: Valium 5 mg 1 twice daily  #60 refill 2.  FOLLOW UP:    [place] A. Merlene Laughter, MD. Board Certified, Neurology                                        Board Certified, Neurology Board Certified, Sleep Medicine  Board certified, Sleep medicine  Printed on 11/01/2005 at 10:15 AM]]]]]]]]]]]]]]]]]]]]]]]]]]]]]]]]]]]]]]]]]]]]]]    Blood pressure 177/88, pulse 63, temperature 98.4 F (36.9 C), temperature source Oral, resp. rate 18, height _0  (1.549 m), weight 93.441 kg (206 lb), SpO2 100 %.  Past Medical History  Diagnosis Date  . Hypertension   . Borderline diabetes mellitus   . 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 GERD (gastroesophageal reflux disease)   . Obstructive sleep apnea on CPAP   . Depression with anxiety   . Anxiety     Past Surgical History  Procedure Laterality Date  . Umbilical hernia repair    . Tonsillectomy    . Rectal surgery      As an infant    Family History  Problem Relation Age of Onset  . Dementia Other   . Alzheimer's disease Other   . Diabetes Other   . Coronary artery disease Other   . Heart attack Other   . Cancer Other   . Lung cancer Other   . Sudden death Sister   . Colon cancer Neg Hx   . Cancer Mother   . Cancer Father     Social History:  reports that he has never smoked. He has never used smokeless tobacco. He reports that he does not drink alcohol or use illicit drugs.  Allergies: No Known Allergies  Medications: Prior to Admission medications   Medication Sig Start Date End Date Taking?  Authorizing Provider  ALPRAZolam Duanne Moron) 0.5 MG tablet Take 0.5 mg by mouth 2 (two) times daily as needed. Nerves/stress 11/06/14  Yes Historical Provider, MD  aspirin 325 MG tablet Take 1 tablet (325 mg total) by mouth daily. 12/03/14  Yes Erline Hau, MD  furosemide (LASIX) 20 MG tablet Take 20 mg by mouth daily. 11/04/14  Yes Historical Provider, MD  gabapentin (NEURONTIN) 300 MG capsule Take 1 capsule (300 mg total) by mouth 3 (three) times daily. 12/03/14  Yes Estela Leonie Green, MD  HYDROcodone-acetaminophen (NORCO/VICODIN) 5-325 MG per tablet Take 1 tablet by mouth every 8 (eight) hours as needed for severe pain.  04/29/15  Yes Historical Provider, MD  lisinopril (PRINIVIL,ZESTRIL) 20 MG tablet Take 20 mg by mouth daily. 11/04/14  Yes Historical Provider, MD  metFORMIN (GLUCOPHAGE-XR) 500 MG 24 hr tablet Take 1,000 mg by mouth daily.  11/04/14  Yes Historical Provider, MD  metoprolol succinate (TOPROL-XL) 100 MG 24 hr tablet Take 100 mg by mouth daily. 11/04/14  Yes Historical Provider, MD  mirtazapine (REMERON) 30 MG tablet Take 30 mg by mouth at bedtime.   Yes Historical Provider, MD  nitroGLYCERIN (NITROSTAT) 0.4 MG SL tablet Place 0.4 mg under the tongue every 5 (five) minutes as needed for chest pain.   Yes Historical Provider, MD  potassium chloride (K-DUR,KLOR-CON) 10 MEQ tablet Take 10 mEq by mouth 2 (two) times daily. 11/04/14  Yes Historical Provider, MD  triamcinolone cream (KENALOG) 0.1 % Apply 1 application topically 2 (two) times daily.   Yes Historical Provider, MD    Scheduled Meds: . antiseptic oral rinse  7 mL Mouth Rinse BID  . aspirin  325 mg Oral Daily  . enoxaparin (LOVENOX) injection  40 mg Subcutaneous Q24H  . [START ON 05/04/2015] feeding supplement (ENSURE ENLIVE)  237 mL Oral BID BM  . furosemide  20 mg Oral Daily  . insulin aspart  0-15 Units Subcutaneous TID WC  . insulin aspart  0-5 Units  Subcutaneous QHS  . metoprolol succinate  100 mg Oral Daily   . potassium chloride  10 mEq Oral BID  . sodium chloride  3 mL Intravenous Q12H   Continuous Infusions:  PRN Meds:.acetaminophen **OR** acetaminophen, alum & mag hydroxide-simeth, bisacodyl, HYDROcodone-acetaminophen, magnesium citrate, ondansetron **OR** ondansetron (ZOFRAN) IV, senna-docusate, traZODone     Results for orders placed or performed during the hospital encounter of 05/03/15 (from the past 48 hour(s))  Ethanol     Status: None   Collection Time: 05/03/15 11:50 AM  Result Value Ref Range   Alcohol, Ethyl (B) <5 <5 mg/dL    Comment:        LOWEST DETECTABLE LIMIT FOR SERUM ALCOHOL IS 5 mg/dL FOR MEDICAL PURPOSES ONLY   Protime-INR     Status: None   Collection Time: 05/03/15 11:50 AM  Result Value Ref Range   Prothrombin Time 14.2 11.6 - 15.2 seconds   INR 1.08 0.00 - 1.49  APTT     Status: None   Collection Time: 05/03/15 11:50 AM  Result Value Ref Range   aPTT 29 24 - 37 seconds  Comprehensive metabolic panel     Status: Abnormal   Collection Time: 05/03/15 11:50 AM  Result Value Ref Range   Sodium 138 135 - 145 mmol/L   Potassium 3.9 3.5 - 5.1 mmol/L   Chloride 101 101 - 111 mmol/L   CO2 28 22 - 32 mmol/L   Glucose, Bld 308 (H) 65 - 99 mg/dL   BUN 13 6 - 20 mg/dL   Creatinine, Ser 1.22 0.61 - 1.24 mg/dL   Calcium 9.0 8.9 - 10.3 mg/dL   Total Protein 7.3 6.5 - 8.1 g/dL   Albumin 4.1 3.5 - 5.0 g/dL   AST 40 15 - 41 U/L   ALT 53 17 - 63 U/L   Alkaline Phosphatase 62 38 - 126 U/L   Total Bilirubin 2.5 (H) 0.3 - 1.2 mg/dL   GFR calc non Af Amer >60 >60 mL/min   GFR calc Af Amer >60 >60 mL/min    Comment: (NOTE) The eGFR has been calculated using the CKD EPI equation. This calculation has not been validated in all clinical situations. eGFR's persistently <60 mL/min signify possible Chronic Kidney Disease.    Anion gap 9 5 - 15  Troponin I     Status: None   Collection Time: 05/03/15 11:50 AM  Result Value Ref Range   Troponin I <0.03 <0.031 ng/mL     Comment:        NO INDICATION OF MYOCARDIAL INJURY.   TSH     Status: None   Collection Time: 05/03/15 11:50 AM  Result Value Ref Range   TSH 2.379 0.350 - 4.500 uIU/mL  Lipid panel     Status: Abnormal   Collection Time: 05/03/15 11:50 AM  Result Value Ref Range   Cholesterol 159 0 - 200 mg/dL   Triglycerides 405 (H) <150 mg/dL   HDL 28 (L) >40 mg/dL   Total CHOL/HDL Ratio 5.7 RATIO   VLDL UNABLE TO CALCULATE IF TRIGLYCERIDE OVER 400 mg/dL 0 - 40 mg/dL   LDL Cholesterol UNABLE TO CALCULATE IF TRIGLYCERIDE OVER 400 mg/dL 0 - 99 mg/dL    Comment:        Total Cholesterol/HDL:CHD Risk Coronary Heart Disease Risk Table                     Men   Women  1/2 Average Risk   3.4  3.3  Average Risk       5.0   4.4  2 X Average Risk   9.6   7.1  3 X Average Risk  23.4   11.0        Use the calculated Patient Ratio above and the CHD Risk Table to determine the patient's CHD Risk.        ATP III CLASSIFICATION (LDL):  <100     mg/dL   Optimal  100-129  mg/dL   Near or Above                    Optimal  130-159  mg/dL   Borderline  160-189  mg/dL   High  >190     mg/dL   Very High   CBG monitoring, ED     Status: Abnormal   Collection Time: 05/03/15 11:55 AM  Result Value Ref Range   Glucose-Capillary 305 (H) 65 - 99 mg/dL  CBC     Status: None   Collection Time: 05/03/15 12:00 PM  Result Value Ref Range   WBC 7.0 4.0 - 10.5 K/uL   RBC 5.39 4.22 - 5.81 MIL/uL   Hemoglobin 16.2 13.0 - 17.0 g/dL   HCT 45.8 39.0 - 52.0 %   MCV 85.0 78.0 - 100.0 fL   MCH 30.1 26.0 - 34.0 pg   MCHC 35.4 30.0 - 36.0 g/dL   RDW 13.7 11.5 - 15.5 %   Platelets 184 150 - 400 K/uL  Differential     Status: None   Collection Time: 05/03/15 12:00 PM  Result Value Ref Range   Neutrophils Relative % 67 43 - 77 %   Neutro Abs 4.7 1.7 - 7.7 K/uL   Lymphocytes Relative 25 12 - 46 %   Lymphs Abs 1.7 0.7 - 4.0 K/uL   Monocytes Relative 6 3 - 12 %   Monocytes Absolute 0.4 0.1 - 1.0 K/uL   Eosinophils  Relative 2 0 - 5 %   Eosinophils Absolute 0.1 0.0 - 0.7 K/uL   Basophils Relative 0 0 - 1 %   Basophils Absolute 0.0 0.0 - 0.1 K/uL  I-stat troponin, ED (not at [facility], [facility])     Status: None   Collection Time: 05/03/15 12:00 PM  Result Value Ref Range   Troponin i, poc 0.00 0.00 - 0.08 ng/mL   Comment 3            Comment: Due to the release kinetics of cTnI, a negative result within the first hours of the onset of symptoms does not rule out myocardial infarction with certainty. If myocardial infarction is still suspected, repeat the test at appropriate intervals.   I-Stat Chem 8, ED  (not at [facility], [facility])     Status: Abnormal   Collection Time: 05/03/15 12:01 PM  Result Value Ref Range   Sodium 139 135 - 145 mmol/L   Potassium 3.9 3.5 - 5.1 mmol/L   Chloride 99 (L) 101 - 111 mmol/L   BUN 13 6 - 20 mg/dL   Creatinine, Ser 1.20 0.61 - 1.24 mg/dL   Glucose, Bld 309 (H) 65 - 99 mg/dL   Calcium, Ion 1.19 1.13 - 1.30 mmol/L   TCO2 25 0 - 100 mmol/L   Hemoglobin 17.0 13.0 - 17.0 g/dL   HCT 50.0 39.0 - 52.0 %  Urine rapid drug screen (hosp performed)not at [facility]     Status: None   Collection Time: 05/03/15  1:39 PM  Result  Value Ref Range   Opiates NONE DETECTED NONE DETECTED   Cocaine NONE DETECTED NONE DETECTED   Benzodiazepines NONE DETECTED NONE DETECTED   Amphetamines NONE DETECTED NONE DETECTED   Tetrahydrocannabinol NONE DETECTED NONE DETECTED   Barbiturates NONE DETECTED NONE DETECTED    Comment:        DRUG SCREEN FOR MEDICAL PURPOSES ONLY.  IF CONFIRMATION IS NEEDED FOR ANY PURPOSE, NOTIFY LAB WITHIN 5 DAYS.        LOWEST DETECTABLE LIMITS FOR URINE DRUG SCREEN Drug Class       Cutoff (ng/mL) Amphetamine      1000 Barbiturate      200 Benzodiazepine   858 Tricyclics       850 Opiates          300 Cocaine          300 THC              50   Urinalysis, Routine w reflex microscopic (not at [facility])     Status: Abnormal   Collection Time: 05/03/15  1:39 PM  Result Value  Ref Range   Color, Urine YELLOW YELLOW   APPearance CLEAR CLEAR   Specific Gravity, Urine 1.010 1.005 - 1.030   pH 6.5 5.0 - 8.0   Glucose, UA >1000 (A) NEGATIVE mg/dL   Hgb urine dipstick TRACE (A) NEGATIVE   Bilirubin Urine NEGATIVE NEGATIVE   Ketones, ur NEGATIVE NEGATIVE mg/dL   Protein, ur TRACE (A) NEGATIVE mg/dL   Urobilinogen, UA 0.2 0.0 - 1.0 mg/dL   Nitrite NEGATIVE NEGATIVE   Leukocytes, UA NEGATIVE NEGATIVE  Urine microscopic-add on     Status: None   Collection Time: 05/03/15  1:39 PM  Result Value Ref Range   WBC, UA 0-2 <3 WBC/hpf   RBC / HPF 0-2 <3 RBC/hpf  Glucose, capillary     Status: Abnormal   Collection Time: 05/03/15  6:17 PM  Result Value Ref Range   Glucose-Capillary 152 (H) 65 - 99 mg/dL   Comment 1 Notify RN    Comment 2 Document in Chart     Studies/Results: BRAIN MRI 11-2014 CLINICAL DATA: Left facial droop. Slurred speech.  EXAM: MRI HEAD WITHOUT CONTRAST  TECHNIQUE: Multiplanar, multiecho pulse sequences of the brain and surrounding structures were obtained without intravenous contrast.  COMPARISON: Head CT 12/01/2014 and MRI 12/03/2003  FINDINGS: There is no evidence of acute infarct, intracranial hemorrhage, mass, midline shift, or extra-axial fluid collection. Mild, age appropriate cerebral atrophy is present. Small foci of T2 hyperintensity in the cerebral white matter are nonspecific but compatible with minimal chronic small vessel ischemic disease.  Orbits are unremarkable. Paranasal sinuses are clear. There are small bilateral mastoid effusions. Major intracranial vascular flow voids are preserved.  IMPRESSION: 1. No acute intracranial abnormality. 2. Minimal chronic small vessel ischemic disease.   BRAIN MRA 11-2014 NORMAL    The patient's brain MRI is reviewed in person. There is moderate global atrophy a somewhat more pronounced than as expected for the patient's age. There is moderate periventricular and deep  white matter increased signal consistent with a chronic microscopy ischemic changes. Nothing acute is seen in diffusion imaging was done no infarcts are seen on the T1 weighted sequences. No hemosiderin deposits are seen on the ECO gradient contrast in sequences. IV contrast does not reveal any abnormal enhancement.        [place]obby[place] Marulanda A. Merlene Laughter, M.D.  Diplomate, Tax adviser of Psychiatry and Neurology ( Neurology). 05/03/2015, 7:32 PM

## 2015-05-03 NOTE — ED Notes (Signed)
Tele neuro consult ended, neurologist to call EDP, but reported to nurse that not enough evidence is present to warrant TPA, and did mention possibility of this being a seizure.

## 2015-05-03 NOTE — ED Notes (Signed)
Caregiver reports she was shaving patient and noticed he had left facial droop and slurred speech.

## 2015-05-03 NOTE — ED Notes (Signed)
Patient appears weak and reports being very tired, resulting in difficulty to assess patient. Shaking noted at times by patient.

## 2015-05-03 NOTE — ED Provider Notes (Signed)
CSN: [id number]     Arrival date & time 05/03/15  1142 History  This chart was scribed for Elnora Morrison, MD by Mercy Moore, ED scribe.  This patient was seen in room APA04/APA04 and the patient's care was started at 11:44 AM.   No chief complaint on file.  Level 5 Caveat: Condition of Patient  The history is provided by the EMS personnel. No language interpreter was used.   HPI Comments: [name] [name] is a 64 y.o. male brought in by ambulance, who presents to the Emergency Department with reported presentation of left facial droop and slurred/mumbled speech onset one hour ago. 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 Patient has general weakness today starting the last few hours and speech is worse than normal. Patient had baseline needs assistance and caregiver, Linus left eye, diabetes history. No fever or neck stiffness. Difficulty obtaining details from patient due to minimal verbal and one-word responses.   Past Medical History  Diagnosis Date  . Hypertension   . Borderline diabetes mellitus   . 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 GERD (gastroesophageal reflux disease)   . Obstructive sleep apnea on CPAP   . Depression with anxiety   . Anxiety    Past Surgical History  Procedure Laterality Date  . Umbilical hernia repair    . Tonsillectomy    . Rectal surgery      As an infant   Family History  Problem Relation Age of Onset  . Dementia Other   . Alzheimer's disease Other   . Diabetes Other   . Coronary artery disease Other   . Heart attack Other   . Cancer Other   . Lung cancer Other   . Sudden death Sister   . Colon cancer Neg Hx   . Cancer Mother   . Cancer Father    History  Substance Use Topics  . Smoking status: Never Smoker   . Smokeless tobacco: Never Used  . Alcohol Use: No    Review of Systems  Unable to perform ROS: Acuity of condition    Allergies  Review of patient's allergies  indicates no known allergies.  Home Medications   Prior to Admission medications   Medication Sig Start Date End Date Taking? Authorizing Provider  ALPRAZolam Duanne Moron) 0.5 MG tablet Take 0.5 mg by mouth 2 (two) times daily as needed. Nerves/stress 11/06/14   Historical Provider, MD  aspirin 325 MG tablet Take 1 tablet (325 mg total) by mouth daily. 12/03/14   Erline Hau, MD  furosemide (LASIX) 20 MG tablet Take 20 mg by mouth daily. 11/04/14   Historical Provider, MD  gabapentin (NEURONTIN) 300 MG capsule Take 1 capsule (300 mg total) by mouth 3 (three) times daily. 12/03/14   Erline Hau, MD  lisinopril (PRINIVIL,ZESTRIL) 20 MG tablet Take 20 mg by mouth daily. 11/04/14   Historical Provider, MD  metFORMIN (GLUCOPHAGE-XR) 500 MG 24 hr tablet Take 1,000 mg by mouth daily.  11/04/14   Historical Provider, MD  metoprolol succinate (TOPROL-XL) 100 MG 24 hr tablet Take 100 mg by mouth daily. 11/04/14   Historical Provider, MD  nitroGLYCERIN (NITROSTAT) 0.4 MG SL tablet Place 0.4 mg under the tongue every 5 (five) minutes as needed for chest pain.    Historical Provider, MD  potassium chloride (K-DUR,KLOR-CON) 10 MEQ tablet Take 10 mEq by mouth 2 (two) times daily. 11/04/14  Historical Provider, MD  triamcinolone cream (KENALOG) 0.1 % Apply 1 application topically 2 (two) times daily.    Historical Provider, MD   Triage Vitals: BP 189/93 mmHg  Pulse 71  Temp(Src) 98.4 F (36.9 C)  Resp 17  Ht 5\' 5"  (1.651 m)  Wt 204 lb (92.534 kg)  BMI 33.95 kg/m2  SpO2 99% Physical Exam  Constitutional: He appears well-developed and well-nourished.  HENT:  Head: Normocephalic and atraumatic.  Dry mucous membranes  Eyes: Right eye exhibits no discharge. Left eye exhibits no discharge.  Pupils equal  109mm.   Neck: Normal range of motion. Neck supple. No tracheal deviation present.  Cardiovascular: Normal rate and regular rhythm.   Pulmonary/Chest: Effort normal and breath sounds  normal.  Abdominal: Soft. He exhibits no distension. There is no tenderness. There is no guarding.  Musculoskeletal: He exhibits no edema.  Neurological: He is alert. GCS eye subscore is 4. GCS verbal subscore is 4. GCS motor subscore is 6.  Generally weak. Poor effort. Difficult neurological exam. Patient alert to questions however verbal response minimal with few words, follows commands with repeated verbal assistance. No seizure activity no neck stiffness. Patient moves all extremities equal general weakness 4-5, no obvious arm drift however decreased effort.  Skin: Skin is warm. No rash noted.  Psychiatric: He has a normal mood and affect.  Nursing note and vitals reviewed.   ED Course  Procedures (including critical care time)  Labs Review Labs Reviewed  COMPREHENSIVE METABOLIC PANEL - Abnormal; Notable for the following:    Glucose, Bld 308 (*)    Total Bilirubin 2.5 (*)    All other components within normal limits  I-STAT CHEM 8, ED - Abnormal; Notable for the following:    Chloride 99 (*)    Glucose, Bld 309 (*)    All other components within normal limits  CBG MONITORING, ED - Abnormal; Notable for the following:    Glucose-Capillary 305 (*)    All other components within normal limits  ETHANOL  PROTIME-INR  APTT  CBC  DIFFERENTIAL  URINE RAPID DRUG SCREEN, HOSP PERFORMED  URINALYSIS, ROUTINE W REFLEX MICROSCOPIC (NOT AT [facility])  I-STAT TROPOININ, ED    Imaging Review Ct Head Wo Contrast  05/03/2015   CLINICAL DATA:  Code stroke.  Left facial droop and slurred speech.  EXAM: CT HEAD WITHOUT CONTRAST  TECHNIQUE: Contiguous axial images were obtained from the base of the skull through the vertex without contrast.  COMPARISON:  12/01/2014  FINDINGS: No evidence for acute hemorrhage, mass lesion, midline shift, hydrocephalus or large infarct. No acute bone abnormality. Small amount of fluid in left mastoid air cells.  IMPRESSION: No acute intracranial abnormality.  These  results were called by telephone at the time of interpretation on 05/03/2015 at 11:59 am to Dr. Elnora Morrison , who verbally acknowledged these results.   Electronically Signed   By: Markus Daft M.D.   On: 05/03/2015 12:00     EKG Interpretation   Date/Time:  Monday May 03 2015 11:54:33 EDT Ventricular Rate:  76 PR Interval:  149 QRS Duration: 86 QT Interval:  363 QTC Calculation: 408 R Axis:   44 Text Interpretation:  Sinus rhythm Confirmed by Josie Mesa  MD, Ambrose Wile ([id number])  on 05/03/2015 12:05:28 PM      MDM   Final diagnoses:  Acute encephalopathy  Generalized weakness   Patient presents for general weakness per report at baseline difficult in the right however now bilateral upper lower extremity weakness and  slurred speech which is worse than baseline as well. On exam difficult neuro exam however patient is generally weak no focality obtained, mild slurred speech.  Code stroke called, neurology assessed immediately and discussed with myself on the phone. Neurology does not feel this is an acute stroke and TPA is not recommended. Most likely seizure postictal however does recommend MRI to look for stroke for completeness. On recheck patient mild improvement more alert follows commands low better but still generally weak. Discussed with medicine on the phone and at bedside for admission for further workup.  The patients results and plan were reviewed and discussed.   Any x-rays performed were independently reviewed by myself.   Differential diagnosis were considered with the presenting HPI.  Medications - No data to display  Filed Vitals:   05/03/15 1155 05/03/15 1200 05/03/15 1316 05/03/15 1330  BP: 189/93 189/109 164/89 179/94  Pulse: 71 76  74  Temp: 98.4 F (36.9 C)     Resp: 17 23 26 22   Height: 5\' 5"  (1.651 m)     Weight: 204 lb (92.534 kg)     SpO2: 99% 98%  100%    Final diagnoses:  Acute encephalopathy  Generalized weakness    Admission/ observation were  discussed with the admitting physician, patient and/or family and they are comfortable with the plan.     Elnora Morrison, MD 05/03/15 [phone]

## 2015-05-03 NOTE — H&P (Signed)
[facility] History and Physical  [name] [name] [name] [name]:[id number] DOB: 1951-06-19 DOA: 05/03/2015  Referring physician: Reather Converse PCP: Deloria Lair, MD   Chief Complaint: altered mental status  HPI: [name] [name] is a 64 y.o. 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 Initial evaluation in the emergency department concerning for stroke and code stroke called. Evaluated by neurology who did not feel acute stroke and TPA not recommended. Opined likely seizure postictal did recommend MRI. Reportedly patient sent to the emergency department by caregiver who noticed left-sided facial droop and worsening speech while providing a.m. care. Patient denies pain or discomfort by nodding his head. He denied dysuria hematuria constipation diarrhea. He denied headache visual disturbances are by nodding his head. No reports of fever chills nausea vomiting diarrhea. Workup in the emergency department significant for chloride of 99 serum glucose 309. CT of the head without acute abnormality. Initial troponin negative. Urinalysis pending. While in the emergency department vision demonstrated generalized weakness and mumbled speech but able to speak clearly when requesting his cell phone he retrieved. He moved all extremities. He was un-engaging with exam.   Review of Systems:  10 point review of systems complete systems are negative as indicated in history of present illness Past Medical History  Diagnosis Date  . Hypertension   . Borderline diabetes mellitus   . 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 GERD (gastroesophageal reflux disease)   . Obstructive sleep apnea on CPAP   . Depression with anxiety   . Anxiety    Past Surgical History    Procedure Laterality Date  . Umbilical hernia repair    . Tonsillectomy    . Rectal surgery      As an infant   Social History:  reports that he has never smoked. He has never used smokeless tobacco. He reports that he does not drink alcohol or use illicit drugs.  No Known Allergies  Family History  Problem Relation Age of Onset  . Dementia Other   . Alzheimer's disease Other   . Diabetes Other   . Coronary artery disease Other   . Heart attack Other   . Cancer Other   . Lung cancer Other   . Sudden death Sister   . Colon cancer Neg Hx   . Cancer Mother   . Cancer Father      Prior to Admission medications   Medication Sig Start Date End Date Taking? Authorizing Provider  ALPRAZolam Duanne Moron) 0.5 MG tablet Take 0.5 mg by mouth 2 (two) times daily as needed. Nerves/stress 11/06/14  Yes Historical Provider, MD  aspirin 325 MG tablet Take 1 tablet (325 mg total) by mouth daily. 12/03/14  Yes Erline Hau, MD  furosemide (LASIX) 20 MG tablet Take 20 mg by mouth daily. 11/04/14  Yes Historical Provider, MD  gabapentin (NEURONTIN) 300 MG capsule Take 1 capsule (300 mg total) by mouth 3 (three) times daily. 12/03/14  Yes Estela Leonie Green, MD  HYDROcodone-acetaminophen (NORCO/VICODIN) 5-325 MG per tablet Take 1 tablet by mouth every 8 (eight) hours as needed for severe pain.  04/29/15  Yes Historical Provider, MD  lisinopril (PRINIVIL,ZESTRIL) 20 MG tablet Take 20 mg by mouth daily. 11/04/14  Yes Historical Provider, MD  metFORMIN (GLUCOPHAGE-XR) 500 MG 24  hr tablet Take 1,000 mg by mouth daily.  11/04/14  Yes Historical Provider, MD  metoprolol succinate (TOPROL-XL) 100 MG 24 hr tablet Take 100 mg by mouth daily. 11/04/14  Yes Historical Provider, MD  mirtazapine (REMERON) 30 MG tablet Take 30 mg by mouth at bedtime.   Yes Historical Provider, MD  nitroGLYCERIN (NITROSTAT) 0.4 MG SL tablet Place 0.4 mg under the tongue every 5 (five) minutes as needed for chest pain.   Yes  Historical Provider, MD  potassium chloride (K-DUR,KLOR-CON) 10 MEQ tablet Take 10 mEq by mouth 2 (two) times daily. 11/04/14  Yes Historical Provider, MD  triamcinolone cream (KENALOG) 0.1 % Apply 1 application topically 2 (two) times daily.   Yes Historical Provider, MD   Physical Exam: Filed Vitals:   05/03/15 1155 05/03/15 1200 05/03/15 1316 05/03/15 1330  BP: 189/93 189/109 164/89 179/94  Pulse: 71 76  74  Temp: 98.4 F (36.9 C)     Resp: 17 23 26 22   Height: 5\' 5"  (1.651 m)     Weight: 92.534 kg (204 lb)     SpO2: 99% 98%  100%    Wt Readings from Last 3 Encounters:  05/03/15 92.534 kg (204 lb)  12/02/14 92.806 kg (204 lb 9.6 oz)  09/30/13 91.627 kg (202 lb)    General:  Appears calm, somewhat lethargic but comfortable Eyes: PERRL, normal lids, irises & conjunctiva ENT: grossly normal hearing, because membranes of his mouth are pink slightly dry Neck: no LAD, masses or thyromegaly Cardiovascular: RRR, no m/r/g. Trace lower extremity edema Respiratory: CTA bilaterally, no w/r/r. Normal respiratory effort. Abdomen: Obese soft positive bowel sounds nontender throughout no guarding Skin: no rash or induration seen on limited exam Musculoskeletal: grossly normal tone BUE/BLE, joints without swelling/erythema Psychiatric: Does not appear interested per to sedating and exam. Will verbally answer questions but not open eyes. Will follow commands but not open eyes Neurologic: grossly non-focal. Moves all extremities bilateral grip 5 out of 5 with encouragement. Speech more by mumbling and slurred. Holding left side of mouth closed while speaking. Response to mild sternal rub no drift  full range of motion to his neck           Labs on Admission:  Basic Metabolic Panel:  Recent Labs Lab 05/03/15 1150 05/03/15 1201  NA 138 139  K 3.9 3.9  CL 101 99*  CO2 28  --   GLUCOSE 308* 309*  BUN 13 13  CREATININE 1.22 1.20  CALCIUM 9.0  --    Liver Function Tests:  Recent  Labs Lab 05/03/15 1150  AST 40  ALT 53  ALKPHOS 62  BILITOT 2.5*  PROT 7.3  ALBUMIN 4.1   No results for input(s): LIPASE, AMYLASE in the last 168 hours. No results for input(s): AMMONIA in the last 168 hours. CBC:  Recent Labs Lab 05/03/15 1200 05/03/15 1201  WBC 7.0  --   NEUTROABS 4.7  --   HGB 16.2 17.0  HCT 45.8 50.0  MCV 85.0  --   PLT 184  --    Cardiac Enzymes: No results for input(s): CKTOTAL, CKMB, CKMBINDEX, TROPONINI in the last 168 hours.  BNP (last 3 results) No results for input(s): BNP in the last 8760 hours.  ProBNP (last 3 results) No results for input(s): PROBNP in the last 8760 hours.  CBG:  Recent Labs Lab 05/03/15 1155  GLUCAP 305*    Radiological Exams on Admission: Ct Head Wo Contrast  05/03/2015   CLINICAL DATA:  Code stroke.  Left facial droop and slurred speech.  EXAM: CT HEAD WITHOUT CONTRAST  TECHNIQUE: Contiguous axial images were obtained from the base of the skull through the vertex without contrast.  COMPARISON:  12/01/2014  FINDINGS: No evidence for acute hemorrhage, mass lesion, midline shift, hydrocephalus or large infarct. No acute bone abnormality. Small amount of fluid in left mastoid air cells.  IMPRESSION: No acute intracranial abnormality.  These results were called by telephone at the time of interpretation on 05/03/2015 at 11:59 am to Dr. Elnora Morrison , who verbally acknowledged these results.   Electronically Signed   By: Markus Daft M.D.   On: 05/03/2015 12:00    EKG: Independently reviewed.sinus rythm  Assessment/Plan Principal Problem:   Acute encephalopathy: Her caregiver questionable slurred speech with facial droop during shaving this morning. CT without acute abnormality. Neuro exam without focal deficits. Initially code stroke called and  Evaluated by tele neuro who did not recommend TPA. Question possible seizure with post ictal phase.  Will admit to medical floor. Will get an MRI and EEG for completeness. Will  check hemoglobin A1c lipid panel. Will obtain RPR, B12, folate. Will cycle troponin. Will request PT/OT consult. He passed bedside swallow eval.  At time of my exam he is more alert with clear speech and moving all extremities. Continue his home aspirin and his beta blocker. Of note is admitted in February 2016 with similar presentation. He had an EEG and MRI at that time which were unrevealing. He also had evaluation by neurology whose note indicates workup unrevealing. Addition he was evaluated by psychiatry for anxiety/depression during this hospitalization. Patient follow-up recommended. In addition he was evaluated by neurology in October 2014 emergency department with a similar presentation. On salt note Dr. Leonel Ramsay opines his exam and history consistent with conversion disorder. That time he recommended psychological counseling Active Problems: Generalized weakness: See above. On admission he is afebrile hemodynamically stable. Blood work is unremarkable. Will request PT and OT evaluation.    Essential hypertension: Fair control. I medications include Lasix, lisinopril, Toprol-XL. Will continue his Lasix and his Toprol. Will hold his lisinopril. Monitor    GERD: Stable at baseline    Diabetes: We'll obtain a hemoglobin A1c. He is on metformin at home I will hold this for now. Will use sliding scale insulin for optimal control. Our but modified diet       Code Status: full DVT Prophylaxis: Family Communication: left message for darlen Angelos sister Disposition Plan: home when ready  Time spent: 71 minutes  [facility] Pager [phone]

## 2015-05-03 NOTE — Progress Notes (Signed)
Patient c/o chest pain, rated 10/10 on a 0-10 pain scale, stated "it's like pins sticking in me". C/o shortness of breath "oh yes, it's awful". Smiling, laughing, regular unlabored respirations, answers admission questions appropriately. On reassessment, patient denied pain. During neuro checks, pt stated "I can't move my legs and my right one is numb". States numbness has been present prior to admission. Notified Dr. Sarajane Jews. Pt appears comfortable at this time, sister Darlene at bedside. Pt watching patient safety video, discussed fall risk safety plan, instructed to call for assistance and not attempt getting up on his own. States will call. Call light within reach. Donavan Foil, RN

## 2015-05-04 ENCOUNTER — Observation Stay (HOSPITAL_COMMUNITY)
Admit: 2015-05-04 | Discharge: 2015-05-04 | Disposition: A | Discharge status: Home / Self Care | Payer: Medicaid Other | Attending: Internal Medicine | Admitting: Internal Medicine

## 2015-05-04 ENCOUNTER — Observation Stay (HOSPITAL_COMMUNITY): Payer: Medicaid Other

## 2015-05-04 DIAGNOSIS — R531 Weakness: Secondary | ICD-10-CM | POA: Diagnosis not present

## 2015-05-04 DIAGNOSIS — E119 Type 2 diabetes mellitus without complications: Secondary | ICD-10-CM

## 2015-05-04 DIAGNOSIS — G9340 Encephalopathy, unspecified: Secondary | ICD-10-CM | POA: Diagnosis not present

## 2015-05-04 DIAGNOSIS — R29898 Other symptoms and signs involving the musculoskeletal system: Secondary | ICD-10-CM | POA: Diagnosis not present

## 2015-05-04 LAB — MAGNESIUM: MAGNESIUM: 1.6 mg/dL — AB (ref 1.7–2.4)

## 2015-05-04 LAB — HIV ANTIBODY (ROUTINE TESTING W REFLEX): HIV Screen 4th Generation wRfx: NONREACTIVE

## 2015-05-04 LAB — BASIC METABOLIC PANEL
ANION GAP: 6 (ref 5–15)
BUN: 14 mg/dL (ref 6–20)
CO2: 25 mmol/L (ref 22–32)
Calcium: 8.5 mg/dL — ABNORMAL LOW (ref 8.9–10.3)
Chloride: 106 mmol/L (ref 101–111)
Creatinine, Ser: 1.06 mg/dL (ref 0.61–1.24)
Glucose, Bld: 164 mg/dL — ABNORMAL HIGH (ref 65–99)
POTASSIUM: 3.4 mmol/L — AB (ref 3.5–5.1)
SODIUM: 137 mmol/L (ref 135–145)

## 2015-05-04 LAB — HEMOGLOBIN A1C
HEMOGLOBIN A1C: 7.7 % — AB (ref 4.8–5.6)
Mean Plasma Glucose: 174 mg/dL

## 2015-05-04 LAB — TROPONIN I: Troponin I: <0.03 ng/mL (ref <0.031)

## 2015-05-04 LAB — FOLATE RBC
Folate, Hemolysate: 527.3 ng/mL
Folate, RBC: 1139 ng/mL (ref >498)
Hematocrit: 46.3 % (ref 37.5–51.0)

## 2015-05-04 LAB — GLUCOSE, CAPILLARY
GLUCOSE-CAPILLARY: 216 mg/dL — AB (ref 65–99)
GLUCOSE-CAPILLARY: 245 mg/dL — AB (ref 65–99)
GLUCOSE-CAPILLARY: 203 mg/dL — AB (ref 65–99)

## 2015-05-04 LAB — RPR: RPR: NONREACTIVE

## 2015-05-04 MED ORDER — SODIUM CHLORIDE 0.9 % IV SOLN
1000.0000 mg | Freq: Every day | INTRAVENOUS | Status: DC
  Administered 2015-05-04 – 2015-05-05 (×2): 1000 mg via INTRAVENOUS
  Filled 2015-05-04 (×3): qty 8

## 2015-05-04 MED ORDER — INSULIN ASPART 100 UNIT/ML ~~LOC~~ SOLN
0.0000 [IU] | Freq: Three times a day (TID) | SUBCUTANEOUS | Status: DC
  Administered 2015-05-04: 7 [IU] via SUBCUTANEOUS
  Administered 2015-05-05 (×2): 15 [IU] via SUBCUTANEOUS

## 2015-05-04 MED ORDER — MAGNESIUM SULFATE 2 GM/50ML IV SOLN
2.0000 g | Freq: Once | INTRAVENOUS | Status: AC
  Administered 2015-05-04: 2 g via INTRAVENOUS
  Filled 2015-05-04: qty 50

## 2015-05-04 MED ORDER — INSULIN ASPART 100 UNIT/ML ~~LOC~~ SOLN
0.0000 [IU] | Freq: Every day | SUBCUTANEOUS | Status: DC
Start: 2015-05-04 — End: 2015-05-04

## 2015-05-04 MED ORDER — LISINOPRIL 10 MG PO TABS
20.0000 mg | ORAL_TABLET | Freq: Every day | ORAL | Status: DC
  Administered 2015-05-04 – 2015-05-05 (×2): 20 mg via ORAL
  Filled 2015-05-04 (×2): qty 2

## 2015-05-04 MED ORDER — POTASSIUM CHLORIDE CRYS ER 20 MEQ PO TBCR
40.0000 meq | EXTENDED_RELEASE_TABLET | ORAL | Status: AC
  Administered 2015-05-04 (×2): 40 meq via ORAL
  Filled 2015-05-04 (×2): qty 2

## 2015-05-04 MED ORDER — INSULIN ASPART 100 UNIT/ML ~~LOC~~ SOLN
3.0000 [IU] | Freq: Three times a day (TID) | SUBCUTANEOUS | Status: DC
  Administered 2015-05-04 – 2015-05-05 (×3): 3 [IU] via SUBCUTANEOUS

## 2015-05-04 NOTE — Progress Notes (Signed)
Patient ID: [name] [name], male   DOB: [birth year], 64 y.o.   MRN: [id number]  [name], MD     [email or web address]          [name] [name] is an 64 y.o. male.   Assessment/Plan:   1. This patient has multiple symptoms without a clear unifying diagnosis. He has presented this way for multiple years. He does seem to have hard findings on examination however including an extensor plantar reflex on the right with increased tone in right leg weakness. This raises the possibility for cervical or thoracic spine myelopathy. --- There is a graphic findings along with the clinical evaluation Is very suggestive of cervical myelopathy. 2. Blackout spells of unclear etiology. 3. Left facial weakness and dysarthria of unclear etiology but could be due to focal symptoms from hyperglycemia. 4. Poorly controlled diabetes mellitus. 5. Hypertension. 6. Likely underlying depression.   The patient has extensive labs which have been obtained. There is also following. The patient will be given 3 day course of high-dose Solu-Medrol. Neurosurgical consultation is suggested.   He co still of leg weakness and pain -  Both esp RLE.   GENERAL: Patient is in no acute distress but seemed to be in discomfort.  HEENT: Supple. Atraumatic normocephalic.   ABDOMEN: soft  EXTREMITIES: No edema   BACK: Normal.  SKIN: Normal by inspection.   MENTAL STATUS: Alert and oriented. Speech, language and cognition are generally fair. Judgment and insight Somewhat limited however. Affect is quite restricted flat.  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 There is  marked ptosis on the left side.  MOTOR: Normal tone, bulk and strength arms; no pronator drift. Left lower extremity hip flexion 4+/5 and dorsiflexion 3/5. Bulk is normal but tone is increased. Right lower extremity hip flexion 2/5 and dorsiflexion also 2/5. Tone is increased.  COORDINATION: Left finger to nose is normal, right finger to nose is normal, No rest tremor; no intention tremor; no postural tremor; no bradykinesia.  REFLEXES: Deep tendon reflexes are symmetrical and normal in the arms but brisk in the legs. Babinski reflexes are flexor on the left but extensor on the right.   SENSATION: Unreliable but he seemed to have good response to painful some light in the lower extremities.   The cervical spine MRI is reviewed in person there is multilevel disc disease. Most notably at C5-C6 there is a large disc osteophyte complex which causes near complete obliteration of the spinal Fluid and encroachment on the spinal cord. However, there is no intrinsic cord lesions.    Objective: Vital signs in last 24 hours: Temp:  [97.3 F (36.3 C)-98.2 F (36.8 C)] 98 F (36.7 C) (07/12 1448) Pulse Rate:  [62-72] 69 (07/12 1448) Resp:  [18-20] 18 (07/12 1448) BP: (126-176)/(67-79) 156/78 mmHg (07/12 1448) SpO2:  [96 %-99 %] 96 % (07/12 1448)  Intake/Output from previous day: 07/11 0701 - 07/12 0700 In: 636.3 [I.V.:636.3] Out: 800 [Urine:800] Intake/Output this shift:   Nutritional status: Diet heart healthy/carb modified Room service appropriate?: Yes; Fluid consistency:: Thin   Lab Results: Results for orders placed or performed during the hospital encounter of 05/03/15 (from  the past 48 hour(s))  Ethanol     Status: None   Collection Time: 05/03/15 11:50 AM  Result Value Ref Range   Alcohol, Ethyl (B) <5 <5 mg/dL    Comment:        LOWEST DETECTABLE LIMIT FOR SERUM ALCOHOL IS 5 mg/dL FOR MEDICAL PURPOSES ONLY   Protime-INR     Status: None   Collection Time: 05/03/15 11:50 AM    Result Value Ref Range   Prothrombin Time 14.2 11.6 - 15.2 seconds   INR 1.08 0.00 - 1.49  APTT     Status: None   Collection Time: 05/03/15 11:50 AM  Result Value Ref Range   aPTT 29 24 - 37 seconds  Comprehensive metabolic panel     Status: Abnormal   Collection Time: 05/03/15 11:50 AM  Result Value Ref Range   Sodium 138 135 - 145 mmol/L   Potassium 3.9 3.5 - 5.1 mmol/L   Chloride 101 101 - 111 mmol/L   CO2 28 22 - 32 mmol/L   Glucose, Bld 308 (H) 65 - 99 mg/dL   BUN 13 6 - 20 mg/dL   Creatinine, Ser 1.22 0.61 - 1.24 mg/dL   Calcium 9.0 8.9 - 10.3 mg/dL   Total Protein 7.3 6.5 - 8.1 g/dL   Albumin 4.1 3.5 - 5.0 g/dL   AST 40 15 - 41 U/L   ALT 53 17 - 63 U/L   Alkaline Phosphatase 62 38 - 126 U/L   Total Bilirubin 2.5 (H) 0.3 - 1.2 mg/dL   GFR calc non Af Amer >60 >60 mL/min   GFR calc Af Amer >60 >60 mL/min    Comment: (NOTE) The eGFR has been calculated using the CKD EPI equation. This calculation has not been validated in all clinical situations. eGFR's persistently <60 mL/min signify possible Chronic Kidney Disease.    Anion gap 9 5 - 15  Troponin I     Status: None   Collection Time: 05/03/15 11:50 AM  Result Value Ref Range   Troponin I <0.03 <0.031 ng/mL    Comment:        NO INDICATION OF MYOCARDIAL INJURY.   TSH     Status: None   Collection Time: 05/03/15 11:50 AM  Result Value Ref Range   TSH 2.379 0.350 - 4.500 uIU/mL  Hemoglobin A1c     Status: Abnormal   Collection Time: 05/03/15 11:50 AM  Result Value Ref Range   Hgb A1c MFr Bld 7.7 (H) 4.8 - 5.6 %    Comment: (NOTE)         Pre-diabetes: 5.7 - 6.4         Diabetes: >6.4         Glycemic control for adults with diabetes: <7.0    Mean Plasma Glucose 174 mg/dL    Comment: (NOTE) Performed At: [facility] [place], [place] [id number] Lindon Romp MD GY:[id number]   Vitamin B12     Status: None   Collection Time: 05/03/15 11:50 AM  Result Value Ref Range    Vitamin B-12 268 180 - 914 pg/mL    Comment: (NOTE) This assay is not validated for testing neonatal or myeloproliferative syndrome specimens for Vitamin B12 levels. Performed at [facility]   Lipid panel     Status: Abnormal   Collection Time: 05/03/15 11:50 AM  Result Value Ref Range   Cholesterol 159 0 - 200 mg/dL   Triglycerides 405 (H) <150 mg/dL  HDL 28 (L) >40 mg/dL   Total CHOL/HDL Ratio 5.7 RATIO   VLDL UNABLE TO CALCULATE IF TRIGLYCERIDE OVER 400 mg/dL 0 - 40 mg/dL   LDL Cholesterol UNABLE TO CALCULATE IF TRIGLYCERIDE OVER 400 mg/dL 0 - 99 mg/dL    Comment:        Total Cholesterol/HDL:CHD Risk Coronary Heart Disease Risk Table                     Men   Women  1/2 Average Risk   3.4   3.3  Average Risk       5.0   4.4  2 X Average Risk   9.6   7.1  3 X Average Risk  23.4   11.0        Use the calculated Patient Ratio above and the CHD Risk Table to determine the patient's CHD Risk.        ATP III CLASSIFICATION (LDL):  <100     mg/dL   Optimal  100-129  mg/dL   Near or Above                    Optimal  130-159  mg/dL   Borderline  160-189  mg/dL   High  >190     mg/dL   Very High   CBG monitoring, ED     Status: Abnormal   Collection Time: 05/03/15 11:55 AM  Result Value Ref Range   Glucose-Capillary 305 (H) 65 - 99 mg/dL  CBC     Status: None   Collection Time: 05/03/15 12:00 PM  Result Value Ref Range   WBC 7.0 4.0 - 10.5 K/uL   RBC 5.39 4.22 - 5.81 MIL/uL   Hemoglobin 16.2 13.0 - 17.0 g/dL   HCT 45.8 39.0 - 52.0 %   MCV 85.0 78.0 - 100.0 fL   MCH 30.1 26.0 - 34.0 pg   MCHC 35.4 30.0 - 36.0 g/dL   RDW 13.7 11.5 - 15.5 %   Platelets 184 150 - 400 K/uL  Differential     Status: None   Collection Time: 05/03/15 12:00 PM  Result Value Ref Range   Neutrophils Relative % 67 43 - 77 %   Neutro Abs 4.7 1.7 - 7.7 K/uL   Lymphocytes Relative 25 12 - 46 %   Lymphs Abs 1.7 0.7 - 4.0 K/uL   Monocytes Relative 6 3 - 12 %   Monocytes Absolute 0.4 0.1  - 1.0 K/uL   Eosinophils Relative 2 0 - 5 %   Eosinophils Absolute 0.1 0.0 - 0.7 K/uL   Basophils Relative 0 0 - 1 %   Basophils Absolute 0.0 0.0 - 0.1 K/uL  I-stat troponin, ED (not at [facility])     Status: None   Collection Time: 05/03/15 12:00 PM  Result Value Ref Range   Troponin i, poc 0.00 0.00 - 0.08 ng/mL   Comment 3            Comment: Due to the release kinetics of cTnI, a negative result within the first hours of the onset of symptoms does not rule out myocardial infarction with certainty. If myocardial infarction is still suspected, repeat the test at appropriate intervals.   I-Stat Chem 8, ED  (not at [facility])     Status: Abnormal   Collection Time: 05/03/15 12:01 PM  Result Value Ref Range   Sodium 139 135 - 145 mmol/L   Potassium 3.9 3.5 - 5.1  mmol/L   Chloride 99 (L) 101 - 111 mmol/L   BUN 13 6 - 20 mg/dL   Creatinine, Ser 1.20 0.61 - 1.24 mg/dL   Glucose, Bld 309 (H) 65 - 99 mg/dL   Calcium, Ion 1.19 1.13 - 1.30 mmol/L   TCO2 25 0 - 100 mmol/L   Hemoglobin 17.0 13.0 - 17.0 g/dL   HCT 50.0 39.0 - 52.0 %  Urine rapid drug screen (hosp performed)not at [facility]     Status: None   Collection Time: 05/03/15  1:39 PM  Result Value Ref Range   Opiates NONE DETECTED NONE DETECTED   Cocaine NONE DETECTED NONE DETECTED   Benzodiazepines NONE DETECTED NONE DETECTED   Amphetamines NONE DETECTED NONE DETECTED   Tetrahydrocannabinol NONE DETECTED NONE DETECTED   Barbiturates NONE DETECTED NONE DETECTED    Comment:        DRUG SCREEN FOR MEDICAL PURPOSES ONLY.  IF CONFIRMATION IS NEEDED FOR ANY PURPOSE, NOTIFY LAB WITHIN 5 DAYS.        LOWEST DETECTABLE LIMITS FOR URINE DRUG SCREEN Drug Class       Cutoff (ng/mL) Amphetamine      1000 Barbiturate      200 Benzodiazepine   725 Tricyclics       366 Opiates          300 Cocaine          300 THC              50   Urinalysis, Routine w reflex microscopic (not at [facility])     Status: Abnormal   Collection Time:  05/03/15  1:39 PM  Result Value Ref Range   Color, Urine YELLOW YELLOW   APPearance CLEAR CLEAR   Specific Gravity, Urine 1.010 1.005 - 1.030   pH 6.5 5.0 - 8.0   Glucose, UA >1000 (A) NEGATIVE mg/dL   Hgb urine dipstick TRACE (A) NEGATIVE   Bilirubin Urine NEGATIVE NEGATIVE   Ketones, ur NEGATIVE NEGATIVE mg/dL   Protein, ur TRACE (A) NEGATIVE mg/dL   Urobilinogen, UA 0.2 0.0 - 1.0 mg/dL   Nitrite NEGATIVE NEGATIVE   Leukocytes, UA NEGATIVE NEGATIVE  Urine microscopic-add on     Status: None   Collection Time: 05/03/15  1:39 PM  Result Value Ref Range   WBC, UA 0-2 <3 WBC/hpf   RBC / HPF 0-2 <3 RBC/hpf  Troponin I     Status: None   Collection Time: 05/03/15  5:43 PM  Result Value Ref Range   Troponin I <0.03 <0.031 ng/mL    Comment:        NO INDICATION OF MYOCARDIAL INJURY.   RPR     Status: None   Collection Time: 05/03/15  5:43 PM  Result Value Ref Range   RPR Ser Ql Non Reactive Non Reactive    Comment: (NOTE) Performed At: [facility] [place], [place] [id number] Lindon Romp MD [id number]   Folate RBC     Status: None   Collection Time: 05/03/15  5:43 PM  Result Value Ref Range   Folate, Hemolysate 527.3 Not Estab. ng/mL   Hematocrit 46.3 37.5 - 51.0 %   Folate, RBC 1139 >498 ng/mL    Comment: (NOTE) Performed At: [facility] [place], [place] [id number] Lindon Romp MD [id number]   HIV antibody     Status: None   Collection Time: 05/03/15  5:43 PM  Result Value Ref Range   HIV Screen  4th Generation wRfx Non Reactive Non Reactive    Comment: (NOTE) Performed At: [facility] [place], [place] [id number] Lindon Romp MD [id number]   Glucose, capillary     Status: Abnormal   Collection Time: 05/03/15  6:17 PM  Result Value Ref Range   Glucose-Capillary 152 (H) 65 - 99 mg/dL   Comment 1 Notify RN    Comment 2 Document in Chart   Glucose, capillary     Status:  Abnormal   Collection Time: 05/03/15 10:05 PM  Result Value Ref Range   Glucose-Capillary 191 (H) 65 - 99 mg/dL  Basic metabolic panel     Status: Abnormal   Collection Time: 05/04/15  1:30 AM  Result Value Ref Range   Sodium 137 135 - 145 mmol/L   Potassium 3.4 (L) 3.5 - 5.1 mmol/L   Chloride 106 101 - 111 mmol/L   CO2 25 22 - 32 mmol/L   Glucose, Bld 164 (H) 65 - 99 mg/dL   BUN 14 6 - 20 mg/dL   Creatinine, Ser 1.06 0.61 - 1.24 mg/dL   Calcium 8.5 (L) 8.9 - 10.3 mg/dL   GFR calc non Af Amer >60 >60 mL/min   GFR calc Af Amer >60 >60 mL/min    Comment: (NOTE) The eGFR has been calculated using the CKD EPI equation. This calculation has not been validated in all clinical situations. eGFR's persistently <60 mL/min signify possible Chronic Kidney Disease.    Anion gap 6 5 - 15  Troponin I (q 6hr x 3)     Status: None   Collection Time: 05/04/15  1:30 AM  Result Value Ref Range   Troponin I <0.03 <0.031 ng/mL    Comment:        NO INDICATION OF MYOCARDIAL INJURY.   Magnesium     Status: Abnormal   Collection Time: 05/04/15  1:30 AM  Result Value Ref Range   Magnesium 1.6 (L) 1.7 - 2.4 mg/dL  Troponin I (q 6hr x 3)     Status: None   Collection Time: 05/04/15  7:30 AM  Result Value Ref Range   Troponin I <0.03 <0.031 ng/mL    Comment:        NO INDICATION OF MYOCARDIAL INJURY.   Glucose, capillary     Status: Abnormal   Collection Time: 05/04/15  9:38 AM  Result Value Ref Range   Glucose-Capillary 216 (H) 65 - 99 mg/dL  Glucose, capillary     Status: Abnormal   Collection Time: 05/04/15 11:25 AM  Result Value Ref Range   Glucose-Capillary 245 (H) 65 - 99 mg/dL  Glucose, capillary     Status: Abnormal   Collection Time: 05/04/15  4:37 PM  Result Value Ref Range   Glucose-Capillary 203 (H) 65 - 99 mg/dL    Lipid Panel  Recent Labs  05/03/15 1150  CHOL 159  TRIG 405*  HDL 28*  CHOLHDL 5.7  VLDL UNABLE TO CALCULATE IF TRIGLYCERIDE OVER 400 mg/dL  LDLCALC  UNABLE TO CALCULATE IF TRIGLYCERIDE OVER 400 mg/dL    Studies/Results:  Cervical spine and T spine MRI:  1. Degenerative multifactorial spinal stenosis with mild mass effect on the spinal cord at C3-C4 and C5-C6, but no cord signal abnormality. 2. Moderate to severe neural foraminal stenosis at the right C5 and bilateral C6 nerve levels.   1. No evidence of thoracic myelopathy or cord deformity. 2. Stable right paracentral disc protrusion at T8-9 without resulting foraminal compromise. 3.  No acute findings.   Medications:  Scheduled Meds: . antiseptic oral rinse  7 mL Mouth Rinse BID  . aspirin  325 mg Oral Daily  . enoxaparin (LOVENOX) injection  40 mg Subcutaneous Q24H  . feeding supplement (ENSURE ENLIVE)  237 mL Oral BID BM  . furosemide  20 mg Oral Daily  . insulin aspart  0-20 Units Subcutaneous TID WC  . insulin aspart  0-5 Units Subcutaneous QHS  . insulin aspart  3 Units Subcutaneous TID WC  . lisinopril  20 mg Oral Daily  . metoprolol succinate  100 mg Oral Daily  . sodium chloride  3 mL Intravenous Q12H   Continuous Infusions:  PRN Meds:.acetaminophen **OR** acetaminophen, alum & mag hydroxide-simeth, bisacodyl, HYDROcodone-acetaminophen, ondansetron **OR** ondansetron (ZOFRAN) IV, senna-docusate, traZODone       Kallon Caylor A. Merlene Laughter, M.D.  Diplomate, Tax adviser of Psychiatry and Neurology ( Neurology).

## 2015-05-04 NOTE — Progress Notes (Signed)
[facility] PROGRESS NOTE  [name] [name] PJA:[id number] DOB: 1951-08-28 DOA: 05/03/2015 PCP: Deloria Lair, MD  Assessment/Plan: Acute encephalopathy/generalized weakness.much improved this am.  CT without acute abnormality. Neuro exam without focal deficits. Initially code stroke called and Evaluated by tele neuro who did not recommend TPA. Question possible seizure with post ictal phase. MRI without acute abdnormality. EEG results pending. Hemoglobin A1c 7.7 lipid panel with triglycerides 405 and HDL 28.  RPR, B12, folate within limits of normal. Troponin negative x3.  PT/OT evaluation recommending psychiatric consult. Evaluated by neurology who recommend MRI cervical spine and thorasic spine. Cervical spine MRI with degenerative multifactorial spinal stenosis with mild mass effect on the spinal cord at C3-C4 and C5-C6, but no cord signal abnormality. Moderate to severe neural foraminal stenosis at the right C5 and bilateral C6 nerve levels. MRI thoracic spine no evidence of thoracic myelopathy or cord deformity. Stable right paracentral disc protrusion at T8-9 without resulting foraminal compromise. No acute findings. Will likely benefit OP neurosurgery follow up.  Of note is admitted in February 2016 with similar presentation. He had an EEG and MRI at that time which were unrevealing. He also had evaluation by neurology whose note indicates workup unrevealing. Addition he was evaluated by psychiatry for anxiety/depression during this hospitalization. Patient follow-up recommended. In addition he was evaluated by neurology in October 2014 emergency department with a similar presentation. On salt note Dr. Leonel Ramsay opines his exam and history consistent with conversion disorder. That time he recommended psychological counseling. Will request behavorial health consult. Active Problems: Generalized weakness: See above. He remains afebrile hemodynamically stable. Blood work is unremarkable.  PT  evaluation recommending OP PT   Essential hypertension: Fair control. Home medications include Lasix, lisinopril, Toprol-XL. Will continue   GERD: Stable at baseline   Diabetes: Hemoglobin A1c 7.7. He is on metformin at home. CBG's high. Will adjust SSI and add meal coverage.     Code Status: full Family Communication: none present Disposition Plan: home hopefully tomorrw   Consultants:  neurology  Procedures:  none  Antibiotics:  none  HPI/Subjective: Sitting in chair. Reports "i cant remember what the doctor said"  Objective: Filed Vitals:   05/04/15 0627  BP: 168/77  Pulse: 62  Temp: 97.7 F (36.5 C)  Resp: 18    Intake/Output Summary (Last 24 hours) at 05/04/15 1404 Last data filed at 05/04/15 1232  Gross per 24 hour  Intake 876.33 ml  Output   1400 ml  Net -523.67 ml   Filed Weights   05/03/15 1155 05/03/15 1812  Weight: 92.534 kg (204 lb) 93.441 kg (206 lb)    Exam:   General:  Well nourished appears comfortable  Cardiovascular: rrr no MGR no LE edema  Respiratory: normal effort BS clear bilaterally no wheeze  Abdomen: obese soft +BS non-tender to palpation  Musculoskeletal:  Joints without swelling/erythema resists during exam. Will reach for cell phone and call bell spontaniously LE weakness intentional.   Data Reviewed: Basic Metabolic Panel:  Recent Labs Lab 05/03/15 1150 05/03/15 1201 05/04/15 0130  NA 138 139 137  K 3.9 3.9 3.4*  CL 101 99* 106  CO2 28  --  25  GLUCOSE 308* 309* 164*  BUN 13 13 14   CREATININE 1.22 1.20 1.06  CALCIUM 9.0  --  8.5*  MG  --   --  1.6*   Liver Function Tests:  Recent Labs Lab 05/03/15 1150  AST 40  ALT 53  ALKPHOS 62  BILITOT 2.5*  PROT  7.3  ALBUMIN 4.1   No results for input(s): LIPASE, AMYLASE in the last 168 hours. No results for input(s): AMMONIA in the last 168 hours. CBC:  Recent Labs Lab 05/03/15 1200 05/03/15 1201  WBC 7.0  --   NEUTROABS 4.7  --   HGB 16.2 17.0   HCT 45.8 50.0  MCV 85.0  --   PLT 184  --    Cardiac Enzymes:  Recent Labs Lab 05/03/15 1150 05/03/15 1743 05/04/15 0130 05/04/15 0730  TROPONINI <0.03 <0.03 <0.03 <0.03   BNP (last 3 results) No results for input(s): BNP in the last 8760 hours.  ProBNP (last 3 results) No results for input(s): PROBNP in the last 8760 hours.  CBG:  Recent Labs Lab 05/03/15 1155 05/03/15 1817 05/03/15 2205 05/04/15 0938 05/04/15 1125  GLUCAP 305* 152* 191* 216* 245*    No results found for this or any previous visit (from the past 240 hour(s)).   Studies: Ct Head Wo Contrast  05/03/2015   CLINICAL DATA:  Code stroke.  Left facial droop and slurred speech.  EXAM: CT HEAD WITHOUT CONTRAST  TECHNIQUE: Contiguous axial images were obtained from the base of the skull through the vertex without contrast.  COMPARISON:  12/01/2014  FINDINGS: No evidence for acute hemorrhage, mass lesion, midline shift, hydrocephalus or large infarct. No acute bone abnormality. Small amount of fluid in left mastoid air cells.  IMPRESSION: No acute intracranial abnormality.  These results were called by telephone at the time of interpretation on 05/03/2015 at 11:59 am to Dr. Elnora Morrison , who verbally acknowledged these results.   Electronically Signed   By: Markus Daft M.D.   On: 05/03/2015 12:00   Mr Jeri Cos GE Contrast  05/03/2015   CLINICAL DATA:  Left-sided facial droop and slurred speech. Acute encephalopathy.  EXAM: MRI HEAD WITHOUT AND WITH CONTRAST  TECHNIQUE: Multiplanar, multiecho pulse sequences of the brain and surrounding structures were obtained without and with intravenous contrast.  CONTRAST:  19 mL MultiHance  COMPARISON:  CT head without contrast 05/03/2015. MRI brain 12/01/2014.  FINDINGS: Minimal periventricular and subcortical T2 changes bilaterally are similar to the prior exam. Study is mildly degraded by patient motion. No acute infarct, hemorrhage, or mass lesion is present.  The ventricles are  proportionate to the degree of atrophy. Globes orbits are intact. The paranasal sinuses are clear. There is some fluid in the mastoid air cells bilaterally. No obstructing nasopharyngeal lesions are present.  The postcontrast images demonstrate no pathologic enhancement  Skullbase is within normal limits. Midline structures are unremarkable.  IMPRESSION: 1. Stable atrophy and white matter disease. 2. No acute intracranial abnormality.   Electronically Signed   By: San Morelle M.D.   On: 05/03/2015 17:34   Mr Cervical Spine Wo Contrast  05/04/2015   CLINICAL DATA:  64 year old male with pain and right lower extremity weakness with increased plantar reflex tone suggesting cervical or thoracic spine myelopathy. Initial encounter.  EXAM: MRI CERVICAL SPINE WITHOUT CONTRAST  TECHNIQUE: Multiplanar, multisequence MR imaging of the cervical spine was performed. No intravenous contrast was administered.  COMPARISON:  Thoracic spine MRI from today reported separately. Brain MRI 05/03/2015. Cervical spine CT 09/21/2013.  FINDINGS: Straightening of cervical lordosis, increased compared to 2014. No marrow edema or evidence of acute osseous abnormality.  Cervicomedullary junction is within normal limits. No cervical spinal cord signal abnormality identified.  Negative paraspinal soft tissues, dominant left vertebral artery incidentally noted. Trace retained secretions in the trachea below the  level of the thyroid (series 6, image 25).  C2-C3:  Mild right facet hypertrophy.  No significant stenosis.  C3-C4: Circumferential disc bulge, broad-based posterior component. Spinal stenosis with mild mass effect on the spinal cord, no cord signal abnormality (series 6, image 10). Uncovertebral and mild facet hypertrophy. Mild C4 foraminal stenosis.  C4-C5: Mild disc bulge. Uncovertebral hypertrophy greater on the right. No spinal stenosis. Mild to moderate right C5 foraminal stenosis.  C5-C6: Circumferential disc bulge with  lobulated and broad-based posterior component of disc. Mild to moderate ligament flavum hypertrophy. Spinal stenosis with mild spinal cord mass effect, no cord signal abnormality (series 5, image 21). Uncovertebral and facet hypertrophy. Moderate to severe left and mild to moderate right C6 foraminal stenosis.  C6-C7: Small right paracentral disc protrusion best seen on series 6, image 26. This narrows the ventral CSF space on the right without significant spinal stenosis. Mild uncovertebral hypertrophy. No significant foraminal stenosis.  C7-T1:  Negative.  No upper thoracic spinal stenosis.  IMPRESSION: 1. Degenerative multifactorial spinal stenosis with mild mass effect on the spinal cord at C3-C4 and C5-C6, but no cord signal abnormality. 2. Moderate to severe neural foraminal stenosis at the right C5 and bilateral C6 nerve levels.   Electronically Signed   By: Genevie Ann M.D.   On: 05/04/2015 09:25   Mr Thoracic Spine Wo Contrast  05/04/2015   CLINICAL DATA:  Bilateral leg weakness for several days. Worsening pain and weakness on the right today. Evaluate for cervical or thoracic myelopathy. Initial encounter.  EXAM: MRI THORACIC SPINE WITHOUT CONTRAST  TECHNIQUE: Multiplanar, multisequence MR imaging of the thoracic spine was performed. No intravenous contrast was administered.  COMPARISON:  Chest CT and thoracic spine 09/21/2013.  FINDINGS: Cervical findings are dictated separately.  The thoracic alignment is normal. There is no evidence of fracture or paraspinal abnormality. There are no worrisome osseous findings.  The thoracic cord is normal in signal and caliber. Conus medullaris extends to the L1-2 disc space level. No paraspinal abnormalities demonstrated. There are scattered small paraspinal osteophytes.  There is stable small right paracentral disc protrusion at T8-9. This contacts the ventral surface of the cord. There is no foraminal compromise or nerve root encroachment.  No other significant  thoracic disc space findings.  Mild disc bulging and anterior osteophytes noted at L1-2, only imaged in the sagittal plane.  IMPRESSION: 1. No evidence of thoracic myelopathy or cord deformity. 2. Stable right paracentral disc protrusion at T8-9 without resulting foraminal compromise. 3. No acute findings.   Electronically Signed   By: Richardean Sale M.D.   On: 05/04/2015 09:25    Scheduled Meds: . antiseptic oral rinse  7 mL Mouth Rinse BID  . aspirin  325 mg Oral Daily  . enoxaparin (LOVENOX) injection  40 mg Subcutaneous Q24H  . feeding supplement (ENSURE ENLIVE)  237 mL Oral BID BM  . furosemide  20 mg Oral Daily  . insulin aspart  0-15 Units Subcutaneous TID WC  . insulin aspart  0-5 Units Subcutaneous QHS  . metoprolol succinate  100 mg Oral Daily  . sodium chloride  3 mL Intravenous Q12H   Continuous Infusions:   Principal Problem:   Acute encephalopathy Active Problems:   Essential hypertension   GERD   Diabetes   Generalized weakness   Aphasia    Time spent: 35 minutes    [facility] Pager [phone]. If 7PM-7AM, please contact night-coverage at [email or web address], password [facility] 05/04/2015, 2:04 PM

## 2015-05-04 NOTE — Care Management Note (Signed)
Case Management Note  Patient Details  Name: [name] [name] MRN: [id number] Date of Birth: [birth year]  Subjective/Objective:                  Pt admitted from home with encephalopathy. Pt lives with his wife and will return home at discharge. Pt is fairly independent with ADL's. Pt has a cane and walker for home use. Pt also has a CAP aide for 1 hour and 45 minutes daily.  Action/Plan: PT recommends outpt PT. Pt is agreeable and referral form faxed to [facility] outpt PT dept. They will call pt with appt times. No further CM needs noted.  Expected Discharge Date:                  Expected Discharge Plan:  Home/Self Care  In-House Referral:  NA  Discharge planning Services  CM Consult  Post Acute Care Choice:  NA Choice offered to:  NA  DME Arranged:    DME Agency:     HH Arranged:    HH Agency:     Status of Service:  Completed, signed off  Medicare Important Message Given:    Date Medicare IM Given:    Medicare IM give by:    Date Additional Medicare IM Given:    Additional Medicare Important Message give by:     If discussed at [place] of Stay Meetings, dates discussed:    Additional Comments:  Joylene Draft, RN 05/04/2015, 1:51 PM

## 2015-05-04 NOTE — Progress Notes (Signed)
Nutrition Brief Note  Patient identified on the Malnutrition Screening Tool (MST) Report  Wt Readings from Last 15 Encounters:  05/03/15 206 lb (93.441 kg)  05/03/15 204 lb (92.534 kg)  12/02/14 204 lb 9.6 oz (92.806 kg)  09/30/13 202 lb (91.627 kg)  09/23/13 202 lb (91.627 kg)  03/23/10 221 lb (100.245 kg)   Patients weight has been stable for the past couple years.  Weight loss was intentional. Patient states he weighed 217 lbs 3-4 months ago. There is no documentation to confirm this. He states that he was sick of gaining weight and drastically cut back on how much he was eating to lose the weight.   I was unable to obtain much history from him. It was hard trying to keep him on topic as he would consistently keep telling stories. To be frank, he was also slightly rude at times. Note he was admitted for acute encephalopathy.   Body mass index is 38.94 kg/(m^2). Patient meets criteria for obese based on current BMI.   Current diet order is [place], patient is consuming approximately 100% of meals at this time. Labs and medications reviewed.   No nutrition interventions warranted at this time. If nutrition issues arise, please consult RD.   Burtis Junes RD, LDN Nutrition Pager: [phone] 05/04/2015 5:43 PM

## 2015-05-04 NOTE — Progress Notes (Signed)
EEG Completed; Results Pending  

## 2015-05-04 NOTE — Progress Notes (Signed)
Patient bed does not have a scale. Patient unable to stand. Will pass this on to first shift.

## 2015-05-04 NOTE — Procedures (Signed)
  Camden A. Merlene Laughter, MD     [email or web address]           HISTORY: The patient is a 64 year old man who presents with spells of shaking suspicious for seizures.  MEDICATIONS: Scheduled Meds: . antiseptic oral rinse  7 mL Mouth Rinse BID  . aspirin  325 mg Oral Daily  . enoxaparin (LOVENOX) injection  40 mg Subcutaneous Q24H  . feeding supplement (ENSURE ENLIVE)  237 mL Oral BID BM  . furosemide  20 mg Oral Daily  . insulin aspart  0-20 Units Subcutaneous TID WC  . insulin aspart  0-5 Units Subcutaneous QHS  . insulin aspart  3 Units Subcutaneous TID WC  . lisinopril  20 mg Oral Daily  . metoprolol succinate  100 mg Oral Daily  . sodium chloride  3 mL Intravenous Q12H   Continuous Infusions:  PRN Meds:.acetaminophen **OR** acetaminophen, alum & mag hydroxide-simeth, bisacodyl, HYDROcodone-acetaminophen, ondansetron **OR** ondansetron (ZOFRAN) IV, senna-docusate, traZODone  Prior to Admission medications   Medication Sig Start Date End Date Taking? Authorizing Provider  ALPRAZolam Duanne Moron) 0.5 MG tablet Take 0.5 mg by mouth 2 (two) times daily as needed. Nerves/stress 11/06/14  Yes Historical Provider, MD  aspirin 325 MG tablet Take 1 tablet (325 mg total) by mouth daily. 12/03/14  Yes Erline Hau, MD  furosemide (LASIX) 20 MG tablet Take 20 mg by mouth daily. 11/04/14  Yes Historical Provider, MD  gabapentin (NEURONTIN) 300 MG capsule Take 1 capsule (300 mg total) by mouth 3 (three) times daily. 12/03/14  Yes Estela Leonie Green, MD  HYDROcodone-acetaminophen (NORCO/VICODIN) 5-325 MG per tablet Take 1 tablet by mouth every 8 (eight) hours as needed for severe pain.  04/29/15  Yes Historical Provider, MD  lisinopril (PRINIVIL,ZESTRIL) 20 MG tablet Take 20 mg by mouth daily. 11/04/14  Yes Historical Provider, MD  metFORMIN (GLUCOPHAGE-XR) 500 MG 24 hr tablet Take 1,000 mg by mouth daily.  11/04/14  Yes Historical Provider, MD  metoprolol succinate  (TOPROL-XL) 100 MG 24 hr tablet Take 100 mg by mouth daily. 11/04/14  Yes Historical Provider, MD  mirtazapine (REMERON) 30 MG tablet Take 30 mg by mouth at bedtime.   Yes Historical Provider, MD  nitroGLYCERIN (NITROSTAT) 0.4 MG SL tablet Place 0.4 mg under the tongue every 5 (five) minutes as needed for chest pain.   Yes Historical Provider, MD  potassium chloride (K-DUR,KLOR-CON) 10 MEQ tablet Take 10 mEq by mouth 2 (two) times daily. 11/04/14  Yes Historical Provider, MD  triamcinolone cream (KENALOG) 0.1 % Apply 1 application topically 2 (two) times daily.   Yes Historical Provider, MD      ANALYSIS: A 16 channel recording using standard 10 20 measurements is conducted for 22 minutes. There is a well-formed posterior dominant rhythm of 9 Hz which attenuates with eye opening. There is beta activity observed in the frontal areas. Awake and mostly drowsy activities are observed. The patient does have some head movement activity without electrographic correlates. Photic stimulation and hyperventilation were not carried out. There is no focal slowing or lateral slowing. There is no epileptiform activities observed.   IMPRESSION: 1. This recording of the awake and drowsy states is normal.      Elexius Minar A. Merlene Laughter, M.D.  Diplomate, Tax adviser of Psychiatry and Neurology ( Neurology).

## 2015-05-04 NOTE — Progress Notes (Signed)
Patient had 8 beats of Vtach. VSS but when asking patient if he is having any chest pain, he states that "it feels like an elephant is sitting on my chest". Contacted hospitalist and orders were received for stat ekg and q6 troponin levels x3. Also ordered to add a mag level to morning labs. Will continue to monitor closely.

## 2015-05-04 NOTE — Evaluation (Signed)
Physical Therapy Evaluation Patient Details Name: [name] [name] MRN: [id number] DOB: 02-10-1951 Today's Date: 05/04/2015   History of Present Illness  HPI: [name] [name] is a 64 y.o. 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 Initial evaluation in the emergency department concerning for stroke and code stroke called. Evaluated by neurology who did not feel acute stroke and TPA not recommended. Opined likely seizure postictal did recommend MRI.  All studies have been negative for neurologic involvement.  Clinical Impression   Pt was seen for evaluation.  He was very pleasant and cooperative but clearly upset about his perceived weakness and inability to walk.  He presents with unusual findings.  His joint ROM is WNL throughout but his ability to move through this ROM is lacking in UEs and the RLE.  When asked to hold a position isometrically he exhibits normal strength in the UEs.  When asked to move his RLE he actively contracts the opposing muscle groups to prevent any motion from occurring.  He holds his right foot in full plantar flexion and will not allow any passive motion of the ankle.  However, in standing, his foot is in neutral position with dorsiflexion occurring.  His gait is unstable with his cane so I advised him to use his walker for gait.  In my opinion, this pt is dealing with a true conversion type of disorder.  He sincerely believes that he is unable to move about normally.  He did relate that he lost his only child due to a car wreck and it was after that when he developed these weakness symptoms.  He also relates that he was abandoned [place] a park by his mother when he was 82 years old.  He grew up in an orphanage.  My recommendation is for serious  psychiatric help.  Physical therapy will only be a band aid at this point.    Follow Up Recommendations Outpatient PT (if pt would like to try getting a few visits in with Medicaid)    Equipment Recommendations  None recommended by PT    Recommendations for Other Services   none    Precautions / Restrictions Precautions Precautions: Fall Restrictions Weight Bearing Restrictions: No      Mobility  Bed Mobility Overal bed mobility: Needs Assistance Bed Mobility: Supine to Sit     Supine to sit: Min assist     General bed mobility comments: pt asks for PT assist to help pull his trunk anteriorly in the bed  Transfers Overall transfer level: Needs assistance Equipment used: Rolling walker (2 wheeled) Transfers: Sit to/from Stand Sit to Stand: Supervision         General transfer comment: transfer is very slow and labored  Ambulation/Gait Ambulation/Gait assistance: Supervision Ambulation Distance (Feet): 25 Feet ( plus 15' x 1) Assistive device: Rolling walker (2 wheeled) Gait Pattern/deviations: Step-to pattern;Decreased step length - right;Decreased dorsiflexion - right;Decreased weight shift to right   Gait velocity interpretation: <1.8 ft/sec, indicative of risk for recurrent falls General Gait Details: occasionally falls backward...cues given to lean into the walker.   Pt attempted gait with a cane but was very unstable  Stairs            Wheelchair Mobility    Modified Rankin (Stroke Patients Only)  Balance Overall balance assessment: Needs assistance Sitting-balance support: No upper extremity supported Sitting balance-Leahy Scale: Normal     Standing balance support: Bilateral upper extremity supported Standing balance-Leahy Scale: Fair Standing balance comment: falls backward at times when walking but is able to stand and demonstrate leaning backward and doesn't lose his balance                             Pertinent  Vitals/Pain Pain Assessment: No/denies pain    Home Living Family/patient expects to be discharged to:: Private residence Living Arrangements: Spouse/significant other Available Help at Discharge: Family Type of Home: Apartment Home Access: Level entry     Home Layout: One level Home Equipment: Environmental consultant - 2 wheels;Cane - single point;Shower seat;Grab bars - toilet;Grab bars - tub/shower Additional Comments: Wife is a amputee due to DM and has a caregiver that helps her 7 hours a day...Marland Kitchenhe has had a CG 1 1/2 hours/day    Prior Function Level of Independence: Needs assistance   Gait / Transfers Assistance Needed: pt ambulates with a single tip cane and transfers inidependently  ADL's / Homemaking Assistance Needed: pt reports that he needs assist with bathing and dressing  Comments: Retired      Engineer, manufacturing [employer]   Dominant Hand: Right    Extremity/Trunk Assessment   Upper Extremity Assessment: Overall WFL for tasks assessed (see clinical note)           Lower Extremity Assessment: Overall WFL for tasks assessed (see  clinical note)         Communication   Communication: No difficulties  Cognition Arousal/Alertness: Awake/alert Behavior During Therapy: WFL for tasks assessed/performed Overall Cognitive Status: Within Functional Limits for tasks assessed                      General Comments      Exercises        Assessment/Plan    PT Assessment Patient needs continued PT services  PT Diagnosis Difficulty walking   PT Problem List Decreased activity tolerance;Decreased mobility  PT Treatment Interventions Gait training   PT Goals (Current goals can be found in the Care Plan section) Acute Rehab PT Goals Patient Stated Goal: none stated PT Goal Formulation: With patient Time For Goal Achievement: 05/11/15 Potential to Achieve Goals: Fair    Frequency Min 3X/week   Barriers to discharge Decreased caregiver support pt believes that he needs more  assist at home    Co-evaluation               End of Session Equipment Utilized During Treatment: Gait belt Activity Tolerance: Patient tolerated treatment well Patient left: in chair;with call bell/phone within reach;with chair alarm set Nurse Communication: Mobility status         Time: 1010-1059 PT Time Calculation (min) (ACUTE ONLY): 49 min   Charges:   PT Evaluation $Initial PT Evaluation Tier I: 1 Procedure     PT G CodesDemetrios Isaacs L  PT 05/04/2015, 11:14 AM [phone]

## 2015-05-05 DIAGNOSIS — E1165 Type 2 diabetes mellitus with hyperglycemia: Secondary | ICD-10-CM | POA: Diagnosis present

## 2015-05-05 DIAGNOSIS — G4733 Obstructive sleep apnea (adult) (pediatric): Secondary | ICD-10-CM | POA: Diagnosis present

## 2015-05-05 DIAGNOSIS — Z833 Family history of diabetes mellitus: Secondary | ICD-10-CM | POA: Diagnosis not present

## 2015-05-05 DIAGNOSIS — K219 Gastro-esophageal reflux disease without esophagitis: Secondary | ICD-10-CM | POA: Diagnosis present

## 2015-05-05 DIAGNOSIS — Z801 Family history of malignant neoplasm of trachea, bronchus and lung: Secondary | ICD-10-CM | POA: Diagnosis not present

## 2015-05-05 DIAGNOSIS — H532 Diplopia: Secondary | ICD-10-CM | POA: Diagnosis present

## 2015-05-05 DIAGNOSIS — Z820 Family history of epilepsy and other diseases of the nervous system: Secondary | ICD-10-CM | POA: Diagnosis not present

## 2015-05-05 DIAGNOSIS — Z8673 Personal history of transient ischemic attack (TIA), and cerebral infarction without residual deficits: Secondary | ICD-10-CM | POA: Diagnosis not present

## 2015-05-05 DIAGNOSIS — R4701 Aphasia: Secondary | ICD-10-CM

## 2015-05-05 DIAGNOSIS — I10 Essential (primary) hypertension: Secondary | ICD-10-CM

## 2015-05-05 DIAGNOSIS — H5442 Blindness, left eye, normal vision right eye: Secondary | ICD-10-CM | POA: Diagnosis present

## 2015-05-05 DIAGNOSIS — R531 Weakness: Secondary | ICD-10-CM | POA: Diagnosis not present

## 2015-05-05 DIAGNOSIS — R29898 Other symptoms and signs involving the musculoskeletal system: Secondary | ICD-10-CM

## 2015-05-05 DIAGNOSIS — G9340 Encephalopathy, unspecified: Principal | ICD-10-CM

## 2015-05-05 DIAGNOSIS — G4700 Insomnia, unspecified: Secondary | ICD-10-CM | POA: Diagnosis present

## 2015-05-05 DIAGNOSIS — F329 Major depressive disorder, single episode, unspecified: Secondary | ICD-10-CM | POA: Diagnosis present

## 2015-05-05 DIAGNOSIS — Z8249 Family history of ischemic heart disease and other diseases of the circulatory system: Secondary | ICD-10-CM | POA: Diagnosis not present

## 2015-05-05 DIAGNOSIS — F419 Anxiety disorder, unspecified: Secondary | ICD-10-CM | POA: Diagnosis present

## 2015-05-05 LAB — GLUCOSE, CAPILLARY
Glucose-Capillary: 322 mg/dL — ABNORMAL HIGH (ref 65–99)
Glucose-Capillary: 350 mg/dL — ABNORMAL HIGH (ref 65–99)

## 2015-05-05 MED ORDER — PREDNISONE 10 MG PO TABS: ORAL_TABLET | ORAL | Status: DC

## 2015-05-05 MED ORDER — METHYLPREDNISOLONE SODIUM SUCC 1000 MG IJ SOLR
INTRAMUSCULAR | Status: AC
  Filled 2015-05-05: qty 8

## 2015-05-05 NOTE — Progress Notes (Signed)
[name] [name] discharged home with sister per MD order.  Discharge instructions reviewed and discussed with the patient, all questions and concerns answered. Copy of instructions and scripts given to patient.    Medication List    TAKE these medications        ALPRAZolam 0.5 MG tablet  Commonly known as:  XANAX  Take 0.5 mg by mouth 2 (two) times daily as needed. Nerves/stress     aspirin 325 MG tablet  Take 1 tablet (325 mg total) by mouth daily.     furosemide 20 MG tablet  Commonly known as:  LASIX  Take 20 mg by mouth daily.     gabapentin 300 MG capsule  Commonly known as:  NEURONTIN  Take 1 capsule (300 mg total) by mouth 3 (three) times daily.     HYDROcodone-acetaminophen 5-325 MG per tablet  Commonly known as:  NORCO/VICODIN  Take 1 tablet by mouth every 8 (eight) hours as needed for severe pain.     lisinopril 20 MG tablet  Commonly known as:  PRINIVIL,ZESTRIL  Take 20 mg by mouth daily.     metFORMIN 500 MG 24 hr tablet  Commonly known as:  GLUCOPHAGE-XR  Take 1,000 mg by mouth daily.     metoprolol succinate 100 MG 24 hr tablet  Commonly known as:  TOPROL-XL  Take 100 mg by mouth daily.     mirtazapine 30 MG tablet  Commonly known as:  REMERON  Take 30 mg by mouth at bedtime.     nitroGLYCERIN 0.4 MG SL tablet  Commonly known as:  NITROSTAT  Place 0.4 mg under the tongue every 5 (five) minutes as needed for chest pain.     potassium chloride 10 MEQ tablet  Commonly known as:  K-DUR,KLOR-CON  Take 10 mEq by mouth 2 (two) times daily.     predniSONE 10 MG tablet  Commonly known as:  DELTASONE  Take 6 tabs for 2 days starting 05/06/15 then take 4 tabs for 2 days then take 2 tabs for 2 days then take 1 tab for 2 days then stop.     triamcinolone cream 0.1 %  Commonly known as:  KENALOG  Apply 1 application topically 2 (two) times daily.        Patients skin is clean, dry and intact, no evidence of skin break down. IV site discontinued and  catheter remains intact. Site without signs and symptoms of complications. Dressing and pressure applied.  Patient escorted to car by Tye [place], NT in a wheelchair,  no distress noted upon discharge.  [name] 05/05/2015 5:19 PM

## 2015-05-05 NOTE — Care Management Note (Signed)
Case Management Note  Patient Details  Name: SALOME COZBY MRN: [id number] Date of Birth: 10/06/1951  Subjective/Objective:                    Action/Plan:   Expected Discharge Date:                  Expected Discharge Plan:  Home/Self Care  In-House Referral:  NA  Discharge planning Services  CM Consult  Post Acute Care Choice:  NA Choice offered to:  NA  DME Arranged:    DME Agency:     HH Arranged:    [place] Agency:     Status of Service:  Completed, signed off  Medicare Important Message Given:    Date Medicare IM Given:    Medicare IM give by:    Date Additional Medicare IM Given:    Additional Medicare Important Message give by:     If discussed at [place] of Stay Meetings, dates discussed:    Additional Comments: Pt to be discharged home today. Outpt PT referral form faxed to [facility] outpt PT dept on 05/04/15 per pts request. No further CM needs noted. Christinia Gully [place], RN 05/05/2015, 2:07 PM

## 2015-05-05 NOTE — Discharge Summary (Signed)
Physician Discharge Summary  [name] [name] WUJ:[id number] DOB: 02/17/1951 DOA: 05/03/2015  PCP: Deloria Lair, MD  Admit date: 05/03/2015 Discharge date: 05/05/2015  Time spent: 40 minutes  Recommendations for Outpatient Follow-up:  1. Follow up in 1 week with PCP for evaluation of spinal stenosis at c3-4 C5-6, DM control and HTN control.  2. Is under the care of psychiatry Eli Phillips. Has appointment 05/07/15.  Discharge Diagnoses:  Principal Problem:   Acute encephalopathy Active Problems:   Essential hypertension   GERD   Diabetes   Generalized weakness   Aphasia   Bilateral leg weakness   Discharge Condition: stable  Diet recommendation: carb modified heart healthy  Filed Weights   05/03/15 1155 05/03/15 1812 05/05/15 0515  Weight: 92.534 kg (204 lb) 93.441 kg (206 lb) 94.1 kg (207 lb 7.3 oz)    History of present illness:  [name] [name] is a 64 y.o. male with past medical history of TIA and reported residual right-sided weakness, hypertension, diabetes sleep apnea anxiety presented to the emergency department on 05/03/15 from facility with the chief complaint generalized weakness questionable facial droop and slurred speech per his caregiver. Initial evaluation in the emergency department concerning for stroke and code stroke called. Evaluated by neurology who did not feel acute stroke and TPA not recommended. Opined likely seizure postictal did recommend MRI.  Reportedly patient sent to the emergency department by caregiver who noticed left-sided facial droop and worsening speech while providing a.m. care. Patient denied pain or discomfort by nodding his head. He denied dysuria hematuria constipation diarrhea. He denied headache visual disturbances all by nodding his head. No reported  fever chills nausea vomiting diarrhea.  Workup in the emergency department significant for chloride of 99 serum glucose 309. CT of the head without acute abnormality. Initial troponin  negative. Urinalysis pending.  While in the emergency department patient demonstrated generalized weakness and mumbled speech but able to speak clearly when requesting his cell phone to be retrieved. He moved all extremities. He was un-engaging with exam.   Hospital Course:  Acute encephalopathy/generalized weakness. CT without acute abnormality. Neuro exam without focal deficits. Initially code stroke called and Evaluated by tele neuro who did not recommend TPA. Question possible seizure with post ictal phase. MRI without acute abdnormality. EEG within limits of normal. Hemoglobin A1c 7.7 lipid panel with triglycerides 405 and HDL 28. RPR, B12, folate within limits of normal. Troponin negative x3. PT/OT evaluation recommending psychiatric consult. Evaluated by neurology who recommend MRI cervical spine and thorasic spine. Cervical spine MRI with degenerative multifactorial spinal stenosis with mild mass effect on the spinal cord at C3-C4 and C5-C6, but no cord signal abnormality. Moderate to severe neural foraminal stenosis at the right C5 and bilateral C6 nerve levels. MRI thoracic spine no evidence of thoracic myelopathy or cord deformity. Stable right paracentral disc protrusion at T8-9 without resulting foraminal compromise. No acute findings. Neurology opine cervical myelopathy and recommended solumedrol high dose and  OP neurosurgery follow up. Of note, patient admitted in February 2016 with similar presentation. He had an EEG and MRI at that time which were unrevealing. He also had evaluation by neurology whose note indicated workup unrevealing. Addition he was evaluated by psychiatry for anxiety/depression during that hospitalization. Patient follow-up recommended. In addition he was evaluated by neurology in October 2014 emergency department with a similar presentation. consult note Dr. Leonel Ramsay opined his exam and history consistent with conversion disorder. That time he recommended  psychological counseling. Recommend OP behavorial health consult.  Active Problems: Generalized weakness: See above. At discharge he is at baseline He remained afebrile hemodynamically stable. Blood work is unremarkable. PT evaluation recommending OP PT   Essential hypertension: Fair control. Home medications include Lasix, lisinopril, Toprol-XL. Will continue   GERD: Stable at baseline   Diabetes: Hemoglobin A1c 7.7. OP follow up.     Procedures:  EEG   Consultations:  neurology  Discharge Exam: Filed Vitals:   05/05/15 0923  BP:   Pulse: 95  Temp:   Resp:     General: well nourished appears comfortable Cardiovascular: RRR no MGR No LE edema Respiratory: normal effort BS clear bilaterally no wheeze Neuro: ambulated in hall with cane and steady gait. Speech clear. Bilateral grip 5/5  Discharge Instructions    Current Discharge Medication List    START taking these medications   Details  predniSONE (DELTASONE) 10 MG tablet Take 6 tabs for 2 days starting 05/06/15 then take 4 tabs for 2 days then take 2 tabs for 2 days then take 1 tab for 2 days then stop. Qty: 28 tablet, Refills: 0      CONTINUE these medications which have NOT CHANGED   Details  ALPRAZolam (XANAX) 0.5 MG tablet Take 0.5 mg by mouth 2 (two) times daily as needed. Nerves/stress Refills: 0    aspirin 325 MG tablet Take 1 tablet (325 mg total) by mouth daily.    furosemide (LASIX) 20 MG tablet Take 20 mg by mouth daily. Refills: 11    gabapentin (NEURONTIN) 300 MG capsule Take 1 capsule (300 mg total) by mouth 3 (three) times daily. Qty: 90 capsule, Refills: 1    HYDROcodone-acetaminophen (NORCO/VICODIN) 5-325 MG per tablet Take 1 tablet by mouth every 8 (eight) hours as needed for severe pain.  Refills: 0    lisinopril (PRINIVIL,ZESTRIL) 20 MG tablet Take 20 mg by mouth daily. Refills: 11    metFORMIN (GLUCOPHAGE-XR) 500 MG 24 hr tablet Take 1,000 mg by mouth daily.  Refills: 2     metoprolol succinate (TOPROL-XL) 100 MG 24 hr tablet Take 100 mg by mouth daily. Refills: 11    mirtazapine (REMERON) 30 MG tablet Take 30 mg by mouth at bedtime.    nitroGLYCERIN (NITROSTAT) 0.4 MG SL tablet Place 0.4 mg under the tongue every 5 (five) minutes as needed for chest pain.    potassium chloride (K-DUR,KLOR-CON) 10 MEQ tablet Take 10 mEq by mouth 2 (two) times daily. Refills: 11    triamcinolone cream (KENALOG) 0.1 % Apply 1 application topically 2 (two) times daily.       No Known Allergies Follow-up Information    Follow up with TAPPER,DAVID B, MD.   Specialty:  Family Medicine   Contact information:   [address] [place] [address] [phone]        The results of significant diagnostics from this hospitalization (including imaging, microbiology, ancillary and laboratory) are listed below for reference.    Significant Diagnostic Studies: Ct Head Wo Contrast  05/03/2015   CLINICAL DATA:  Code stroke.  Left facial droop and slurred speech.  EXAM: CT HEAD WITHOUT CONTRAST  TECHNIQUE: Contiguous axial images were obtained from the base of the skull through the vertex without contrast.  COMPARISON:  12/01/2014  FINDINGS: No evidence for acute hemorrhage, mass lesion, midline shift, hydrocephalus or large infarct. No acute bone abnormality. Small amount of fluid in left mastoid air cells.  IMPRESSION: No acute intracranial abnormality.  These results were called by telephone at the time  of interpretation on 05/03/2015 at 11:59 am to Dr. Elnora Morrison , who verbally acknowledged these results.   Electronically Signed   By: Markus Daft M.D.   On: 05/03/2015 12:00   Mr Jeri Cos QM Contrast  05/03/2015   CLINICAL DATA:  Left-sided facial droop and slurred speech. Acute encephalopathy.  EXAM: MRI HEAD WITHOUT AND WITH CONTRAST  TECHNIQUE: Multiplanar, multiecho pulse sequences of the brain and surrounding structures were obtained without and with intravenous contrast.   CONTRAST:  19 mL MultiHance  COMPARISON:  CT head without contrast 05/03/2015. MRI brain 12/01/2014.  FINDINGS: Minimal periventricular and subcortical T2 changes bilaterally are similar to the prior exam. Study is mildly degraded by patient motion. No acute infarct, hemorrhage, or mass lesion is present.  The ventricles are proportionate to the degree of atrophy. Globes orbits are intact. The paranasal sinuses are clear. There is some fluid in the mastoid air cells bilaterally. No obstructing nasopharyngeal lesions are present.  The postcontrast images demonstrate no pathologic enhancement  Skullbase is within normal limits. Midline structures are unremarkable.  IMPRESSION: 1. Stable atrophy and white matter disease. 2. No acute intracranial abnormality.   Electronically Signed   By: San Morelle M.D.   On: 05/03/2015 17:34   Mr Cervical Spine Wo Contrast  05/04/2015   CLINICAL DATA:  64 year old male with pain and right lower extremity weakness with increased plantar reflex tone suggesting cervical or thoracic spine myelopathy. Initial encounter.  EXAM: MRI CERVICAL SPINE WITHOUT CONTRAST  TECHNIQUE: Multiplanar, multisequence MR imaging of the cervical spine was performed. No intravenous contrast was administered.  COMPARISON:  Thoracic spine MRI from today reported separately. Brain MRI 05/03/2015. Cervical spine CT 09/21/2013.  FINDINGS: Straightening of cervical lordosis, increased compared to 2014. No marrow edema or evidence of acute osseous abnormality.  Cervicomedullary junction is within normal limits. No cervical spinal cord signal abnormality identified.  Negative paraspinal soft tissues, dominant left vertebral artery incidentally noted. Trace retained secretions in the trachea below the level of the thyroid (series 6, image 25).  C2-C3:  Mild right facet hypertrophy.  No significant stenosis.  C3-C4: Circumferential disc bulge, broad-based posterior component. Spinal stenosis with mild mass  effect on the spinal cord, no cord signal abnormality (series 6, image 10). Uncovertebral and mild facet hypertrophy. Mild C4 foraminal stenosis.  C4-C5: Mild disc bulge. Uncovertebral hypertrophy greater on the right. No spinal stenosis. Mild to moderate right C5 foraminal stenosis.  C5-C6: Circumferential disc bulge with lobulated and broad-based posterior component of disc. Mild to moderate ligament flavum hypertrophy. Spinal stenosis with mild spinal cord mass effect, no cord signal abnormality (series 5, image 21). Uncovertebral and facet hypertrophy. Moderate to severe left and mild to moderate right C6 foraminal stenosis.  C6-C7: Small right paracentral disc protrusion best seen on series 6, image 26. This narrows the ventral CSF space on the right without significant spinal stenosis. Mild uncovertebral hypertrophy. No significant foraminal stenosis.  C7-T1:  Negative.  No upper thoracic spinal stenosis.  IMPRESSION: 1. Degenerative multifactorial spinal stenosis with mild mass effect on the spinal cord at C3-C4 and C5-C6, but no cord signal abnormality. 2. Moderate to severe neural foraminal stenosis at the right C5 and bilateral C6 nerve levels.   Electronically Signed   By: Genevie Ann M.D.   On: 05/04/2015 09:25   Mr Thoracic Spine Wo Contrast  05/04/2015   CLINICAL DATA:  Bilateral leg weakness for several days. Worsening pain and weakness on the right today. Evaluate for  cervical or thoracic myelopathy. Initial encounter.  EXAM: MRI THORACIC SPINE WITHOUT CONTRAST  TECHNIQUE: Multiplanar, multisequence MR imaging of the thoracic spine was performed. No intravenous contrast was administered.  COMPARISON:  Chest CT and thoracic spine 09/21/2013.  FINDINGS: Cervical findings are dictated separately.  The thoracic alignment is normal. There is no evidence of fracture or paraspinal abnormality. There are no worrisome osseous findings.  The thoracic cord is normal in signal and caliber. Conus medullaris  extends to the L1-2 disc space level. No paraspinal abnormalities demonstrated. There are scattered small paraspinal osteophytes.  There is stable small right paracentral disc protrusion at T8-9. This contacts the ventral surface of the cord. There is no foraminal compromise or nerve root encroachment.  No other significant thoracic disc space findings.  Mild disc bulging and anterior osteophytes noted at L1-2, only imaged in the sagittal plane.  IMPRESSION: 1. No evidence of thoracic myelopathy or cord deformity. 2. Stable right paracentral disc protrusion at T8-9 without resulting foraminal compromise. 3. No acute findings.   Electronically Signed   By: Richardean Sale M.D.   On: 05/04/2015 09:25    Microbiology: No results found for this or any previous visit (from the past 240 hour(s)).   Labs: Basic Metabolic Panel:  Recent Labs Lab 05/03/15 1150 05/03/15 1201 05/04/15 0130  NA 138 139 137  K 3.9 3.9 3.4*  CL 101 99* 106  CO2 28  --  25  GLUCOSE 308* 309* 164*  BUN 13 13 14   CREATININE 1.22 1.20 1.06  CALCIUM 9.0  --  8.5*  MG  --   --  1.6*   Liver Function Tests:  Recent Labs Lab 05/03/15 1150  AST 40  ALT 53  ALKPHOS 62  BILITOT 2.5*  PROT 7.3  ALBUMIN 4.1   No results for input(s): LIPASE, AMYLASE in the last 168 hours. No results for input(s): AMMONIA in the last 168 hours. CBC:  Recent Labs Lab 05/03/15 1200 05/03/15 1201 05/03/15 1743  WBC 7.0  --   --   NEUTROABS 4.7  --   --   HGB 16.2 17.0  --   HCT 45.8 50.0 46.3  MCV 85.0  --   --   PLT 184  --   --    Cardiac Enzymes:  Recent Labs Lab 05/03/15 1150 05/03/15 1743 05/04/15 0130 05/04/15 0730 05/04/15 1300  TROPONINI <0.03 <0.03 <0.03 <0.03 <0.03   BNP: BNP (last 3 results) No results for input(s): BNP in the last 8760 hours.  ProBNP (last 3 results) No results for input(s): PROBNP in the last 8760 hours.  CBG:  Recent Labs Lab 05/04/15 0938 05/04/15 1125 05/04/15 1637  05/05/15 0741 05/05/15 1125  GLUCAP 216* 245* 203* 322* 350*       Signed:  Zachery Niswander M  [facility] 05/05/2015, 2:06 PM

## 2015-05-05 NOTE — Progress Notes (Signed)
Inpatient Diabetes Program Recommendations  AACE/ADA: New Consensus Statement on Inpatient Glycemic Control (2013)  Target Ranges:  Prepandial:   less than 140 mg/dL      Peak postprandial:   less than 180 mg/dL (1-2 hours)      Critically ill patients:  140 - 180 mg/dL  Results for CAMERIN, LADOUCEUR (MRN [id number]) as of 05/05/2015 08:15  Ref. Range 05/04/2015 09:38 05/04/2015 11:25 05/04/2015 16:37 05/05/2015 07:41  Glucose-Capillary Latest Ref Range: 65-99 mg/dL 216 (H) 245 (H) 203 (H) 322 (H)   Reason for Visit: Encephalopathy  Diabetes history: DM 2 Outpatient Diabetes medications: Metformin 1,000 mg Daily Current orders for Inpatient glycemic control: Novolog Resistant (0-20 units) TID, Novolog 3 units Meal Coverage, Novolog bedtime scale (0-5 units QHS)  Inpatient Diabetes Program Recommendations Insulin - Basal: Received 1,000 mg IV Solumedrol last pm. Glucose 322 mg/dl this morning. Please consider starting low dose basal insulin while on IV steroids, Lantus 14 units Q24 hrs.  Thanks,  Tama Headings RN, MSN, [facility] Inpatient Diabetes Coordinator Team Pager [phone]

## 2015-05-26 ENCOUNTER — Ambulatory Visit (HOSPITAL_COMMUNITY): Payer: Medicaid Other | Admitting: Physical Therapy

## 2015-06-09 ENCOUNTER — Ambulatory Visit (HOSPITAL_COMMUNITY): Payer: Medicaid Other | Attending: Family Medicine | Admitting: Physical Therapy

## 2015-06-09 DIAGNOSIS — R262 Difficulty in walking, not elsewhere classified: Secondary | ICD-10-CM | POA: Insufficient documentation

## 2015-06-09 DIAGNOSIS — R29898 Other symptoms and signs involving the musculoskeletal system: Secondary | ICD-10-CM | POA: Diagnosis present

## 2015-06-09 NOTE — Therapy (Signed)
[facility] [address] [place], [place], [address] Phone: [phone]   Fax:  [phone]  Physical Therapy Evaluation  Patient Details  Name: [name] [name] MRN: [id number] Date of Birth: July 30, 1951 Referring Provider:  Samuella Cota, MD  Encounter Date: 06/09/2015      PT End of Session - 06/09/15 1747    Visit Number 1   Number of Visits 1   Authorization Type medicaid   Activity Tolerance Patient tolerated treatment well   Behavior During Therapy [facility] for tasks assessed/performed      Past Medical History  Diagnosis Date  . Hypertension   . Borderline diabetes mellitus   . 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 GERD (gastroesophageal reflux disease)   . Obstructive sleep apnea on CPAP   . Depression with anxiety   . Anxiety     Past Surgical History  Procedure Laterality Date  . Umbilical hernia repair    . Tonsillectomy    . Rectal surgery      As an infant    There were no vitals filed for this visit.  Visit Diagnosis:  Difficulty walking  Weakness of right leg      Subjective Assessment - 06/09/15 1447    Subjective Pt reports that he has been having weakness in his legs, especially his RLE. He reports that his RLE feels cold as ice sometimes, and that his leg "has a mind of it's own." He has been having difficulty walking, and has been ambulating with SPC for about 4 months. He has been to PT previously, where he was prescribed HEP, but he has not been as compliant with it as he should be.  Pt states that he does not remember how long ago his sx came on, or if he had any injuries. He reports that he "woke up one morning and was on the floor and has never been the same since."   How long can you sit comfortably? 5 minutes   How long can you stand comfortably? 5 minutes   How long can you walk comfortably? 5 minutes   Currently in Pain? Yes   Pain Score 10-Worst pain ever   Pain Location  Leg   Pain Orientation Right            [facility] PT Assessment - 06/09/15 0001    Assessment   Medical Diagnosis Weakness   Next MD Visit 07/26/15   Prior Therapy yes   Balance Screen   Has the patient fallen in the past 6 months Yes   How many times? 1   Has the patient had a decrease in activity level because of a fear of falling?  No   Is the patient reluctant to leave their home because of a fear of falling?  No   Home Environment   Living Environment Private residence   Type of Home Apartment   Prior Function   Level of Independence Needs assistance with ADLs;Independent with gait;Independent with transfers   Vocation On disability   ROM / Strength   AROM / PROM / Strength Strength   Strength   Strength Assessment Site Hip;Knee;Ankle   Right/Left Hip Right;Left   Right Hip Flexion 2-/5   Right Hip Extension 2-/5   Right Hip ABduction 2-/5   Left Hip Flexion 4/5   Left Hip Extension 4/5   Left Hip ABduction 4/5   Right/Left Knee Right;Left   Right  Knee Flexion 2-/5   Right Knee Extension 2-/5   Left Knee Flexion 4/5   Left Knee Extension 4/5   Right/Left Ankle Right;Left   Right Ankle Dorsiflexion 2-/5   Right Ankle Plantar Flexion 2-/5   Left Ankle Dorsiflexion 4/5   Left Ankle Plantar Flexion 4/5               PT Education - 06/09/15 1746    Education provided Yes   Education Details HEP prescribed   Person(s) Educated Patient   Methods Explanation;Handout   Comprehension Verbalized understanding;Returned demonstration             PT Long Term Goals - 06/09/15 1754    PT LONG TERM GOAL #1   Title Pt to be independent with HEP.    Time 1   Period Days   Status Achieved               Plan - 06/09/15 1748    Clinical Impression Statement Pt presents to PT with RLE weakness of unknown origin. He demonstrated impairments in functional mobility, RLE strength, gait mechancis, and functional activity tolearnce. Pt reported that he has  been to PT once and was prescribed HEP, but he has not been doing it often. Upon reviewing pt's chart, there were no injuries to be seen on MRI or CT. He was unable to achieve full testing position for RLE MMT of any muscle, reporting 10/10 pain and stating that his leg is "stupid" and he cannot control it. Insurance coverage was discussed with pt, and he reports that he does not wish to do self pay Pt has been prescribed HEP for low level leg strengthening and advised to return to his MD if sx worsen.    Pt will benefit from skilled therapeutic intervention in order to improve on the following deficits Abnormal gait;Decreased activity tolerance;Decreased strength;Difficulty walking;Pain   Rehab Potential Fair   PT Frequency 1x / week   PT Duration --  1 day   PT Home Exercise Plan D/C to HEP   Consulted and Agree with Plan of Care Patient         Problem List Patient Active Problem List   Diagnosis Date Noted  . Bilateral leg weakness   . Generalized weakness 05/03/2015  . Acute encephalopathy 05/03/2015  . Aphasia   . Monocular diplopia 12/02/2014  . Abnormality of gait 12/02/2014  . Depressive disorder   . TIA (transient ischemic attack) 12/01/2014  . Left-sided weakness 07/26/2013  . PRECORDIAL PAIN 03/23/2010  . Essential hypertension 03/15/2010  . GERD 03/15/2010  . Diabetes 03/15/2010    PHYSICAL THERAPY DISCHARGE SUMMARY  Visits from Start of Care: 1  Current functional level related to goals / functional outcomes: See above   Remaining deficits: See above   Education / Equipment: See above  Plan: Patient agrees to discharge.  Patient goals were met. Patient is being discharged due to financial reasons.  ?????      Hilma Favors, PT, DPT [phone]  06/09/2015, 5:55 PM  [facility] [address] [place], [place], [address] Phone: [phone]   Fax:  [phone]

## 2015-06-09 NOTE — Patient Instructions (Signed)
Quad Set   Slowly tighten thigh muscles of straight leg while counting out loud to ___3_. Relax. Repeat __10__ times. Do _2___ sessions per day.  http://gt2.exer.us/705   Copyright  VHI. All rights reserved.  [employer] a large can or rolled towel under left leg. Straighten leg. Hold __3__ seconds. Repeat _10___ times. Do __2__ sessions per day.  http://gt2.exer.us/298   Copyright  VHI. All rights reserved.  HIP: Abduction - Standing   Squeeze glutes. Raise leg out and slightly back. _10__ reps per set, _2__ sets per day, 2 sessions per day.  Hold onto a support.  Copyright  VHI. All rights reserved.  Hip Flexion, Knee Straight   Lift right straight leg forward and up _6-8___ inches. Repeat _10___ times per session. Do __2__ sessions per day.  Copyright  VHI. All rights reserved.  Gluteal Sets   Squeeze buttocks and hold.  Hold for _3__ seconds. Relax for _3__ seconds. Repeat _10__ times. Do _2__ times a day.   Copyright  VHI. All rights reserved.

## 2015-08-16 ENCOUNTER — Ambulatory Visit: Payer: Medicaid Other | Admitting: Neurology

## 2015-08-19 ENCOUNTER — Encounter: Payer: Self-pay | Admitting: Neurology

## 2015-08-19 ENCOUNTER — Ambulatory Visit (INDEPENDENT_AMBULATORY_CARE_PROVIDER_SITE_OTHER): Payer: Medicaid Other | Admitting: Neurology

## 2015-08-19 VITALS — BP 162/99 | HR 72 | Ht 64.0 in | Wt 210.0 lb

## 2015-08-19 DIAGNOSIS — R269 Unspecified abnormalities of gait and mobility: Secondary | ICD-10-CM

## 2015-08-19 DIAGNOSIS — R32 Unspecified urinary incontinence: Secondary | ICD-10-CM

## 2015-08-19 DIAGNOSIS — R202 Paresthesia of skin: Secondary | ICD-10-CM | POA: Diagnosis not present

## 2015-08-19 NOTE — Progress Notes (Signed)
PATIENT: [name] [name] DOB: 02-17-1951  Chief Complaint  Patient presents with  . Gait Problem    He has been having worsening of his gait over the last few years.  He has been falling frequently.  He normally walks with the assistance of a cane but is in a wheelchair because of a fall today. Says his legs are weak.     HISTORICAL  [name] [name] is a 64 years old right-handed male, seen in refer by  His primary care physician Dr. 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  He needs people assistant to get up from seated position, he lives with his wife  who suffered severe diabetes,  with below knee amputation,  He also complains of bilateral lower extremity numbness, especially right leg, felt icy cold, he denies significant low back pain, he also complains mild bilateral upper extremity paresthesia, and weakness,   he complains of mild memory trouble He was hospitalized for left-sided weakness February 2016,  with reported normal evaluations including MRI of the brain, carotid Doppler, echocardiogram, again hospitalization in July 2016 for facial droop dysarthria, negative evaluations,    I have personally reviewed MRIs with without contrast in July 2016,  MRI of the brain showed mild small vessel disease.   MRI of thoracic spine T8 and 9 degenerative disc disease, no evidence of thoracic cord compression.  MRI cervical spine without contrast: Degenerative multifactorial spinal stenosis with mild mass  effect on the spinal cord at C3-C4 and C5-C6, but no cord signal Abnormality. Moderate to severe neural foraminal stenosis at the right C5 and bilateral C6 nerve levels.   I reviewed laboratory evaluation, normal TSH, B12, 268 , A1c was 7.7 , normal CBC, CMP with exception of elevated glucose 308  REVIEW OF SYSTEMS: Full 14 system review of systems performed and notable only for  As above  ALLERGIES: No Known Allergies  HOME MEDICATIONS: Current Outpatient Prescriptions  Medication Sig Dispense Refill  . ALPRAZolam (XANAX) 0.5 MG tablet Take 0.5 mg by mouth 2 (two) times daily as needed. Nerves/stress  0  . aspirin 325 MG tablet Take 1 tablet (325 mg total) by mouth daily.    . furosemide (LASIX) 20 MG tablet Take 20 mg by mouth daily.  11  . gabapentin (NEURONTIN) 300 MG capsule Take 1 capsule (300 mg total) by mouth 3 (three) times daily. 90 capsule 1  . HYDROcodone-acetaminophen (NORCO/VICODIN) 5-325 MG per tablet Take 1 tablet by mouth every 8 (eight) hours as needed for severe pain.   0  . lisinopril (PRINIVIL,ZESTRIL) 20 MG tablet Take 20 mg by mouth daily.  11  . metFORMIN (GLUCOPHAGE-XR) 500 MG 24 hr tablet Take 1,000 mg by mouth daily.   2  . metoprolol succinate (TOPROL-XL) 100 MG 24 hr tablet Take 100 mg by mouth daily.  11  . mirtazapine (REMERON) 30 MG tablet Take 30 mg by mouth at bedtime.    . nitroGLYCERIN (NITROSTAT) 0.4 MG SL tablet Place 0.4 mg under the tongue every  5 (five) minutes as needed for chest pain.    . potassium chloride (K-DUR,KLOR-CON) 10 MEQ tablet Take 10 mEq by mouth 2 (two) times daily.  11  . predniSONE (DELTASONE) 10 MG tablet Take 6 tabs for 2 days starting 05/06/15 then take 4 tabs for 2 days then take 2 tabs for 2 days then take 1 tab for 2 days then stop. 28 tablet 0  . triamcinolone cream (KENALOG) 0.1 % Apply 1 application topically 2 (two) times daily.     No current facility-administered medications for this visit.    PAST MEDICAL  HISTORY: Past Medical History  Diagnosis Date  . Hypertension   . Borderline diabetes mellitus   . 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 GERD (gastroesophageal reflux disease)   . Obstructive sleep apnea on CPAP   . Depression with anxiety   . Anxiety     PAST SURGICAL HISTORY: Past Surgical History  Procedure Laterality Date  . Umbilical hernia repair    . Tonsillectomy    . Rectal surgery      As an infant    FAMILY HISTORY: Family History  Problem Relation Age of Onset  . Dementia Other   . Alzheimer's disease Other   . Diabetes Other   . Coronary artery disease Other   . Heart attack Other   . Cancer Other   . Lung cancer Other   . Sudden death Sister   . Colon cancer Neg Hx   . Cancer Mother   . Cancer Father     SOCIAL HISTORY:  Social History   Social History  . Marital Status: Married    Spouse Name: N/A  . Number of Children: 0  . Years of Education: N/A   Occupational History  . Disabled    Social History Main Topics  . Smoking status: Never Smoker   . Smokeless tobacco: Never Used  . Alcohol Use: No  . Drug Use: No  . Sexual Activity: No   Other Topics Concern  . Not on file   Social History Narrative   Lives at home with his wife.   Right-handed.   2 cups caffeine coffee per day and a few sodas.   Has been on disability all his life due to significant decrease in vision and bowel problems              PHYSICAL EXAM   Filed Vitals:   08/19/15 1312  BP: 162/99  Pulse: 72  Height: 5\' 4"  (1.626 m)  Weight: 210 lb (95.255 kg)    Not recorded      Body mass index is 36.03 kg/(m^2).  PHYSICAL EXAMNIATION:  Gen: NAD, conversant, well nourised, obese, well groomed                     Cardiovascular: Regular rate rhythm, no peripheral edema, warm, nontender. Eyes: Conjunctivae clear without exudates or hemorrhage Neck: Supple, no carotid bruise. Pulmonary: Clear to auscultation bilaterally   NEUROLOGICAL  EXAM:  MENTAL STATUS: Speech:    Speech is normal; fluent and spontaneous with normal comprehension.  Cognition:     Orientation to time, place and person     Normal recent and remote memory     Normal Attention span and concentration     Normal Language, naming, repeating,spontaneous speech     Fund of knowledge   CRANIAL NERVES: CN II: Visual fields are full to confrontation.  Left  pupil was irregular, no light sensitivity, right pupil was round reactive to light CN III, IV, VI: extraocular movement are normal. No ptosis. CN V: Facial sensation is intact to pinprick in all 3 divisions bilaterally. Corneal responses are intact.  CN VII: Face is symmetric with normal eye closure and smile. CN VIII: Hearing is normal to rubbing fingers CN IX, X: Palate elevates symmetrically. Phonation is normal. CN XI: Head turning and shoulder shrug are intact CN XII: Tongue is midline with normal movements and no atrophy.  MOTOR:  there was no significant bilateral upper extremity muscle weakness, mild rigidity of bilateral lower extremity, right  Moderate proximal and distal leg weakness , limited range of motion of bilateral  Achilles tendon, bilateral ankle dorsiflexion 3  REFLEXES: Reflexes are 2+ and symmetric at the biceps, triceps, knees,1/2  and  Absent at ankles. Plantar responses are flexor  On right side, extensor on the left side  SENSORY:  length dependent decreased to light touch, pinprick  To distal shin level COORDINATION: Rapid alternating movements and fine finger movements are intact. There is no dysmetria on finger-to-nose and heel-knee-shin.    GAIT/STANCE:  he needs 2 people assistant to get up from seated position, profound right foot drop,   DIAGNOSTIC DATA (LABS, IMAGING, TESTING) - I reviewed patient records, labs, notes, testing and imaging myself where available.   ASSESSMENT AND PLAN  [name] [name] is a 64 y.o. male    gait difficulty since birth,  Bowel  and bladder incontinence   he has limited range of motion of bilateral Achilles tendon, right leg tends to stay in right ankle plantarflexion,  Mild to moderate right proximal and distal leg  weakness, hyperreflexia,   Potential localization would be lumbar region, MRI of lumbar with and without contrast,   EMG nerve conduction study   return to clinic in one month  Marcial Pacas, M.D. Ph.D.  [facility] [address] [place], [place] [address] Ph: [phone] Fax: [phone]  CC: Zella Richer. Scotty Court, MD

## 2015-09-03 ENCOUNTER — Ambulatory Visit
Admission: RE | Admit: 2015-09-03 | Discharge: 2015-09-03 | Disposition: A | Discharge status: Home / Self Care | Payer: Medicaid Other | Source: Ambulatory Visit | Attending: Neurology | Admitting: Neurology

## 2015-09-03 DIAGNOSIS — R32 Unspecified urinary incontinence: Secondary | ICD-10-CM

## 2015-09-03 DIAGNOSIS — R269 Unspecified abnormalities of gait and mobility: Secondary | ICD-10-CM | POA: Diagnosis not present

## 2015-09-03 DIAGNOSIS — R202 Paresthesia of skin: Secondary | ICD-10-CM

## 2015-09-03 MED ORDER — GADOBENATE DIMEGLUMINE 529 MG/ML IV SOLN
19.0000 mL | Freq: Once | INTRAVENOUS | Status: AC | PRN
  Administered 2015-09-03: 19 mL via INTRAVENOUS

## 2015-09-06 ENCOUNTER — Telehealth: Payer: Self-pay | Admitting: Neurology

## 2015-09-06 NOTE — Telephone Encounter (Signed)
Spoke to patient - he is aware of results and will keep his pending appts.

## 2015-09-06 NOTE — Telephone Encounter (Signed)
Please call patient, MRI of the lumbar showed increased epidural fat within the spinal epidural space, mild degenerative disc disease, no significant foraminal or canal stenosis. I will review findings with him in detail at follow-up visit  IMPRESSION: This is an abnormal MRI of the lumbar spine showed the following: 1. Lumbarization of the S1 vertebral body. 2. 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 The resultant spinal stenosis could be symptomatic.  2. Mild multilevel degenerative changes as detailed above that did not lead to any nerve root compression.

## 2015-09-29 ENCOUNTER — Ambulatory Visit (INDEPENDENT_AMBULATORY_CARE_PROVIDER_SITE_OTHER): Payer: Medicaid Other | Admitting: Neurology

## 2015-09-29 DIAGNOSIS — R202 Paresthesia of skin: Secondary | ICD-10-CM

## 2015-09-29 DIAGNOSIS — R269 Unspecified abnormalities of gait and mobility: Secondary | ICD-10-CM

## 2015-09-29 DIAGNOSIS — R29898 Other symptoms and signs involving the musculoskeletal system: Secondary | ICD-10-CM

## 2015-09-29 DIAGNOSIS — R32 Unspecified urinary incontinence: Secondary | ICD-10-CM

## 2015-09-29 NOTE — Progress Notes (Signed)
PATIENT: [name] [name] DOB: September 13, 1951  No chief complaint on file.    HISTORICAL  [name] [name] is a 64 years old right-handed male, seen in refer by  His primary care physician Dr. 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  He needs people assistant to get up from seated position, he lives with his wife  who suffered severe diabetes,  with below knee amputation,  He also complains of bilateral lower extremity numbness, especially right leg, felt icy cold, he denies significant low back pain, he also complains mild bilateral upper extremity paresthesia, and weakness,   he complains of mild memory trouble He was hospitalized for left-sided weakness February 2016,  with reported normal evaluations including MRI of the brain, carotid Doppler, echocardiogram, again hospitalization in July 2016 for facial droop dysarthria, negative evaluations,    I have personally reviewed MRIs with without contrast in July 2016,  MRI of the brain showed mild small vessel disease.   MRI of thoracic spine T8 and 9 degenerative disc disease, no evidence of thoracic cord compression.  MRI cervical spine without contrast: Degenerative multifactorial spinal stenosis with mild mass effect on the spinal cord at C3-C4 and C5-C6, but no cord signal Abnormality. Moderate to severe neural foraminal stenosis at the right C5 and bilateral C6 nerve levels.  I reviewed laboratory evaluation, normal TSH, B12, 268 , A1c was 7.7 , normal CBC,  CMP with exception of elevated glucose 308  Update September 29 2015: Patient returned for electrodiagnostic study today, which was normal, there was no evidence of large fiber peripheral neuropathy myopathy, or bilateral lumbosacral radiculopathy.  I have personally reviewed MRI of lumbar spine in Nov 2016:  Lumbarization of the S1 vertebral body.  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   REVIEW OF SYSTEMS: Full 14 system review of systems performed and notable only for  As above  ALLERGIES: No Known Allergies  HOME MEDICATIONS: Current Outpatient Prescriptions  Medication Sig Dispense Refill  . ALPRAZolam (XANAX) 0.5 MG tablet Take 0.5 mg by mouth 2 (two) times daily as needed. Nerves/stress  0  . aspirin 325 MG tablet Take 1 tablet (325 mg total) by mouth daily.    . furosemide (LASIX) 20 MG tablet Take 20 mg by mouth daily.  11  . gabapentin (NEURONTIN) 300 MG capsule Take 1 capsule (300 mg total) by mouth 3 (three) times daily. 90 capsule 1  . HYDROcodone-acetaminophen (NORCO/VICODIN) 5-325 MG per tablet Take 1 tablet by mouth every 8 (eight) hours as needed for severe pain.   0  . lisinopril (PRINIVIL,ZESTRIL) 20 MG tablet Take 20 mg by mouth daily.  11  . metFORMIN (GLUCOPHAGE-XR) 500 MG 24 hr tablet Take 1,000 mg by mouth daily.   2  . metoprolol succinate (TOPROL-XL) 100 MG 24 hr tablet Take 100 mg by mouth daily.  11  . mirtazapine (REMERON) 30 MG tablet Take 30 mg by mouth  at bedtime.    . nitroGLYCERIN (NITROSTAT) 0.4 MG SL tablet Place 0.4 mg under the tongue every 5 (five) minutes as needed for chest pain.    . potassium chloride (K-DUR,KLOR-CON) 10 MEQ tablet Take 10 mEq by mouth 2 (two) times daily.  11  . predniSONE (DELTASONE) 10 MG tablet Take 6 tabs for 2 days starting 05/06/15 then take 4 tabs for 2 days then take 2 tabs for 2 days then take 1 tab for 2 days then  stop. 28 tablet 0  . triamcinolone cream (KENALOG) 0.1 % Apply 1 application topically 2 (two) times daily.     No current facility-administered medications for this visit.    PAST MEDICAL HISTORY: Past Medical History  Diagnosis Date  . Hypertension   . Borderline diabetes mellitus   . 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 GERD (gastroesophageal reflux disease)   . Obstructive sleep apnea on CPAP   . Depression with anxiety   . Anxiety     PAST SURGICAL HISTORY: Past Surgical History  Procedure Laterality Date  . Umbilical hernia repair    . Tonsillectomy    . Rectal surgery      As an infant    FAMILY HISTORY: Family History  Problem Relation Age of Onset  . Dementia Other   . Alzheimer's disease Other   . Diabetes Other   . Coronary artery disease Other   . Heart attack Other   . Cancer Other   . Lung cancer Other   . Sudden death Sister   . Colon cancer Neg Hx   . Cancer Mother   . Cancer Father     SOCIAL HISTORY:  Social History   Social History  . Marital Status: Married    Spouse Name: N/A  . Number of Children: 0  . Years of Education: N/A   Occupational History  . Disabled    Social History Main Topics  . Smoking status: Never Smoker   . Smokeless tobacco: Never Used  . Alcohol Use: No  . Drug Use: No  . Sexual Activity: No   Other Topics Concern  . Not on file   Social History Narrative   Lives at home with his wife.   Right-handed.   2 cups caffeine coffee per day and a few sodas.   Has been on disability all his life due to significant decrease in vision and bowel problems              PHYSICAL EXAM   There were no vitals filed for this visit.  Not recorded      There is no weight on file to calculate BMI.  PHYSICAL EXAMNIATION:  Gen: NAD, conversant, well nourised, obese, well groomed                     Cardiovascular: Regular rate rhythm, no peripheral edema, warm, nontender. Eyes: Conjunctivae  clear without exudates or hemorrhage Neck: Supple, no carotid bruise. Pulmonary: Clear to auscultation bilaterally   NEUROLOGICAL EXAM:  MENTAL STATUS: Speech:    Speech is normal; fluent and spontaneous with normal comprehension.  Cognition:     Orientation to time, place and person     Normal recent and remote memory     Normal Attention span and concentration     Normal Language, naming, repeating,spontaneous speech     Fund of knowledge   CRANIAL NERVES: CN II: Visual fields are full  to confrontation.  Left pupil was irregular, no light sensitivity, right pupil was round reactive to light CN III, IV, VI: extraocular movement are normal. No ptosis. CN V: Facial sensation is intact to pinprick in all 3 divisions bilaterally. Corneal responses are intact.  CN VII: Face is symmetric with normal eye closure and smile. CN VIII: Hearing is normal to rubbing fingers CN IX, X: Palate elevates symmetrically. Phonation is normal. CN XI: Head turning and shoulder shrug are intact CN XII: Tongue is midline with normal movements and no atrophy.  MOTOR: Bilateral upper extremity showed normal muscle tone, no weakness, he had variable effort on bilateral lower extremity muscle strands, but with encouragement, he had at least 4+/5 symmetric bilateral bilateral lower extremity proximal and distal muscle strands.   REFLEXES: Reflexes are 2 plus and symmetric at the biceps, triceps, knees, ankles. Plantar responses are flexor bilaterally  SENSORY:  not reliable  COORDINATION: Rapid alternating movements and fine finger movements are intact. There is no dysmetria on finger-to-nose and heel-knee-shin.    GAIT/STANCE:  he showed variable effort, he was able to barely weight bilateral feet together without any difficulty, was able to take few steps move to his wheelchair,    DIAGNOSTIC DATA (LABS, IMAGING, TESTING) - I reviewed patient records, labs, notes, testing and imaging myself where  available.   ASSESSMENT AND PLAN  [name] [name] is a 64 y.o. male   Gait difficulty since birth, progressive worsening gait difficulty since 2011, reported bowel and bladder incontinence  There was no significant structural lesion on extensive MRI of neural axis, MRI of brain, cervical, thoracic, lumbar spine  Electrodiagnostic study showed no evidence of lumbar radiculopathy, myopathy,  He showed variable effort on examination  We will continue to closely observe patient,     Marcial Pacas, M.D. Ph.D.  [facility] [address], [place], [place] [address] Ph: [phone] Fax: [phone]  CC: Zella Richer. Scotty Court, MD

## 2015-09-29 NOTE — Procedures (Signed)
   NCS (NERVE CONDUCTION STUDY) WITH EMG (ELECTROMYOGRAPHY) REPORT   STUDY DATE: September 29 2015 PATIENT NAME: [name] [name] DOB: [birth year] MRN: [id number]    TECHNOLOGIST: Laretta Alstrom ELECTROMYOGRAPHER: Marcial Pacas M.D.  CLINICAL INFORMATION:  64 years old male with progressive gait difficulty since 2011, right leg weaker than the left leg  On examination, bilateral upper extremity has normal muscle tone mass and strength. He has variable effort on bilateral lower extremity muscle testing, but with encouragement, he has at least 4 plus/5 bilateral proximal and distal lower extremity muscle strength. Deep tendon reflexes were very well preserved, and brisk bilaterally, including bilateral ankle reflexes.    FINDINGS: NERVE CONDUCTION STUDY: Bilateral peroneal sensory responses were normal. Bilateral peroneal to EDB and tibial motor responses were normal. Bilateral tibial H reflexes were normal and symmetric.   NEEDLE ELECTROMYOGRAPHY: Selective needle examinations were performed at bilateral lower extremity muscles and bilateral lumbar sacral paraspinal muscles.   Needle examination of bilateral tibialis anterior, tibialis posterior, medial gastrocnemius, vastus lateralis, biceps femoris long head was normal.  There was no spontaneous activity at bilateral lumbosacral paraspinals, bilateral L4-5 and S1.  IMPRESSION:   This was a normal study. There was no electrodiagnostic evidence of large fiber peripheral neuropathy or bilateral lumbosacral radiculopathy, in addition there was no evidence of inflammatory myopathy changes.   INTERPRETING PHYSICIAN:   Marcial Pacas M.D. Ph.D. [facility] [address], [place], [place] [address] [phone]

## 2015-10-05 ENCOUNTER — Ambulatory Visit: Payer: Medicaid Other | Admitting: Neurology

## 2015-12-28 ENCOUNTER — Ambulatory Visit: Payer: Medicaid Other | Admitting: Neurology

## 2016-01-17 ENCOUNTER — Telehealth: Payer: Self-pay | Admitting: *Deleted

## 2016-01-17 ENCOUNTER — Ambulatory Visit: Payer: Medicaid Other | Admitting: Neurology

## 2016-01-17 NOTE — Telephone Encounter (Signed)
Patient arrived to his follow up without his copay.

## 2016-02-22 ENCOUNTER — Ambulatory Visit: Payer: Medicaid Other | Admitting: Neurology

## 2016-03-26 IMAGING — MR MR HEAD W/O CM
8 of 10 series · 35 of 48 positions shown · non-contrast
Comparison: Head CT 12/01/2014 and MRI 12/03/2003

CLINICAL DATA: Left facial droop.  Slurred speech.

EXAM:
MRI HEAD WITHOUT CONTRAST
TECHNIQUE: Multiplanar, multiecho pulse sequences of the brain and surrounding
structures were obtained without intravenous contrast.

[Series 2: [id number]_sag · sagittal · 5.0mm · 0.45mm/px · 2 of 20 slices shown]
[im 1/20]
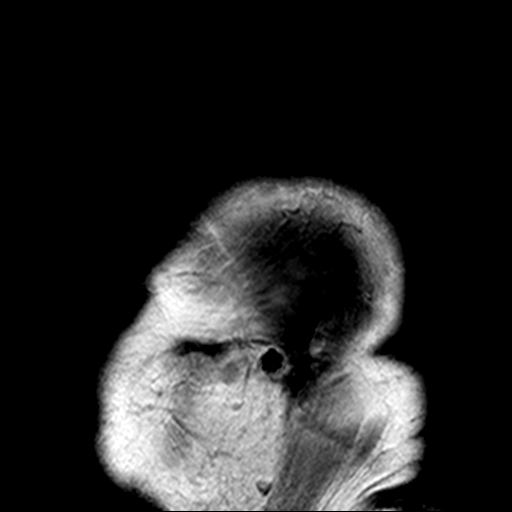
[im 20/20]
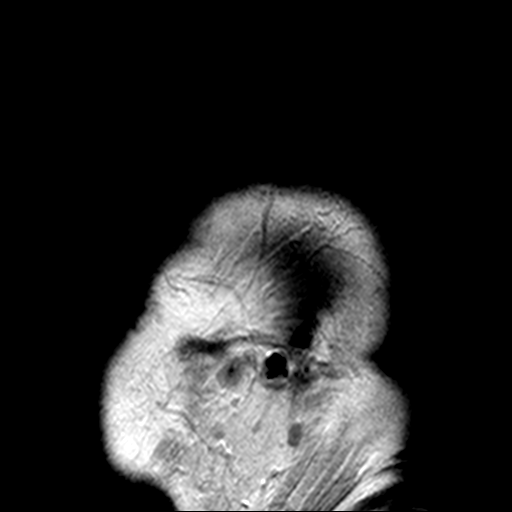

[Series 5: T2 · axial · 5.0mm · 0.68mm/px · z∈[-79,+62]mm · 3 of 23 slices shown (1 of 2)]
[im 1/23]
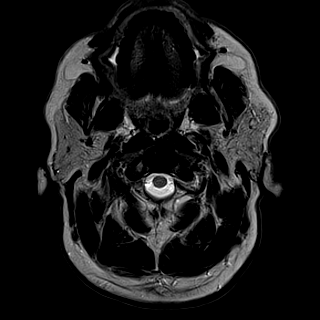
[im 12/23]
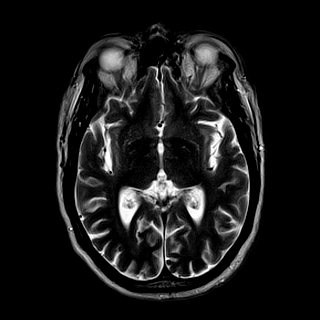
[im 23/23]
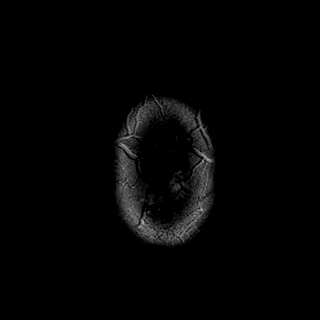

[Series 6: FLAIR · axial · 5.0mm · 0.94mm/px · z∈[-76,+65]mm · 3 of 23 slices shown]
[im 1/23]
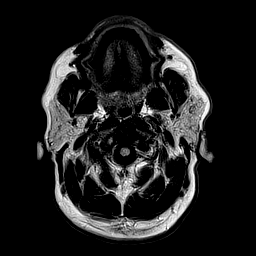
[im 12/23]
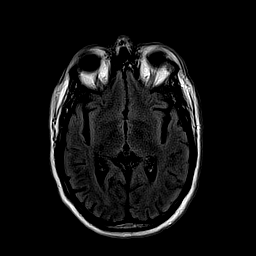
[im 23/23]
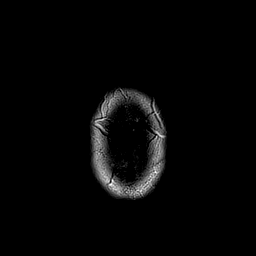

[Series 7: T1 · axial · 2.0mm · 0.47mm/px · z∈[-89,+97]mm · 11 of 95 slices shown]
[im 1/95]
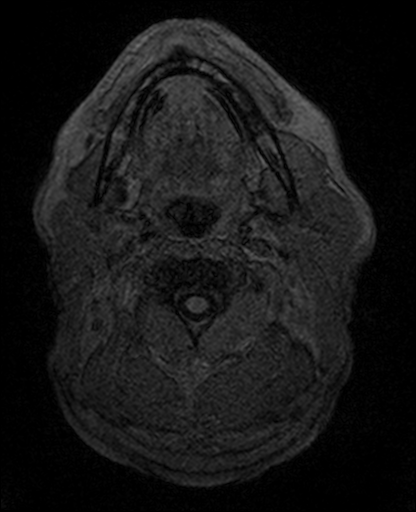
[im 10/95]
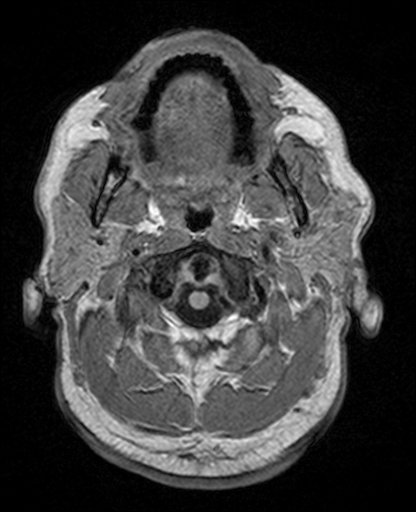
[im 19/95]
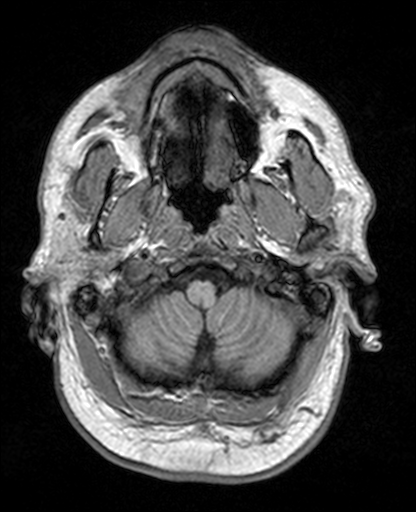
[im 29/95]
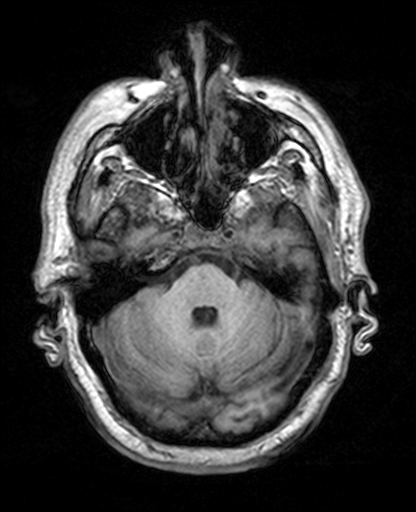
[im 38/95]
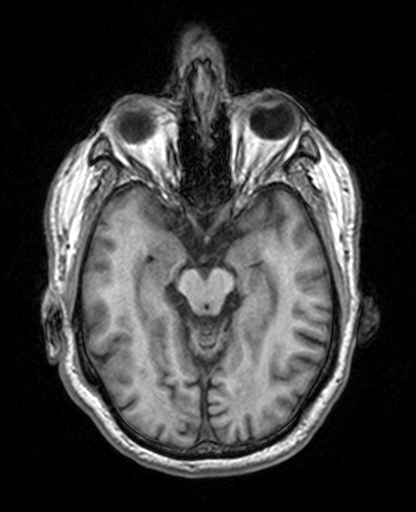
[im 48/95]
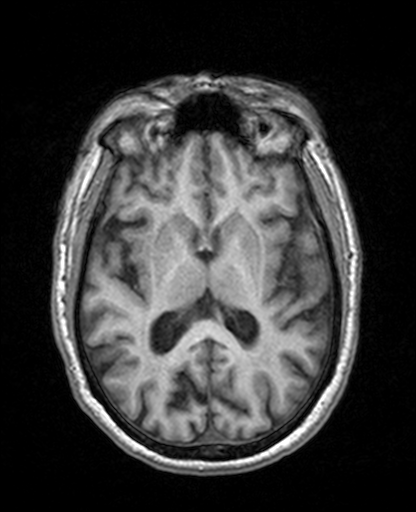
[im 57/95]
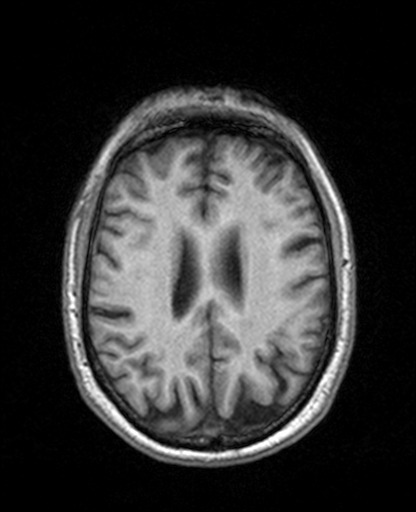
[im 66/95]
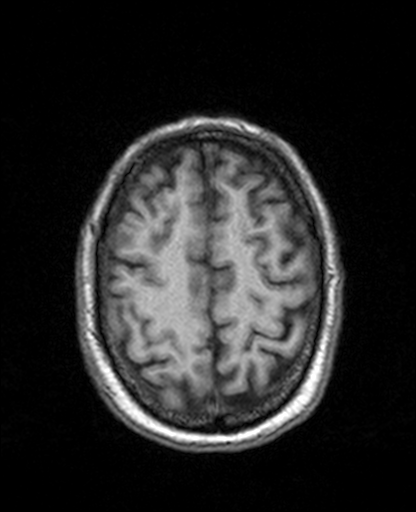
[im 76/95]
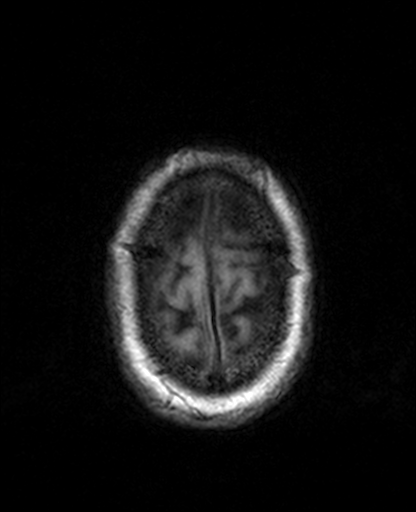
[im 85/95]
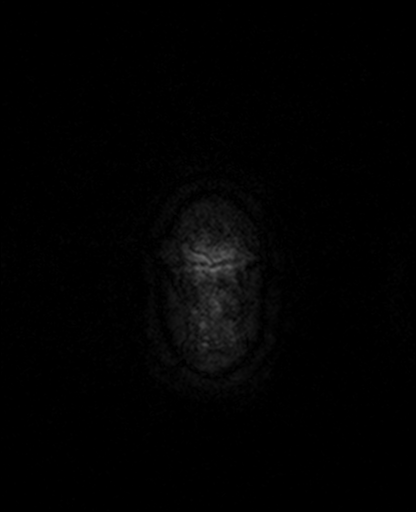
[im 95/95]
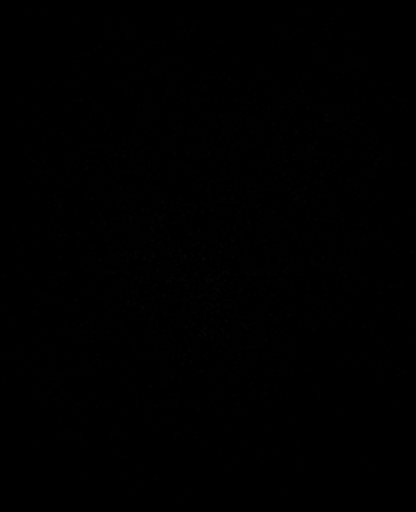

[Series 8: trauma axial · axial · 5.0mm · 0.45mm/px · z∈[-70,+58]mm · 2 of 21 slices shown]
[im 1/21]
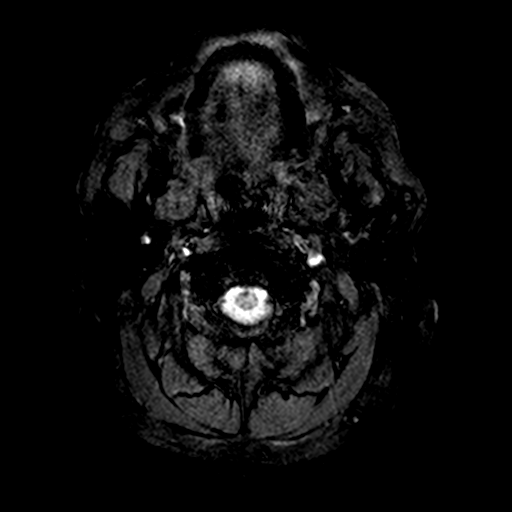
[im 21/21]
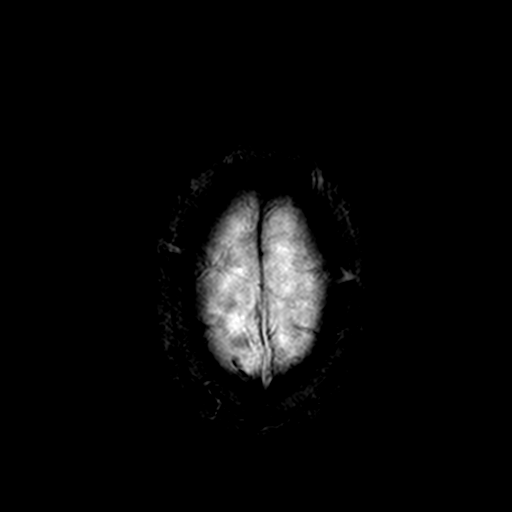

[Series 9: T2 · coronal · 5.0mm · 0.62mm/px · 3 of 26 slices shown (2 of 2)]
[im 1/26]
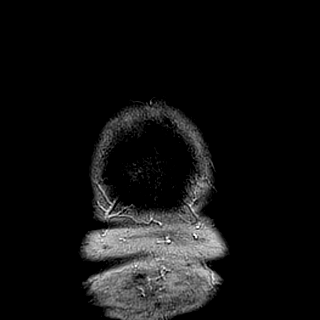
[im 13/26]
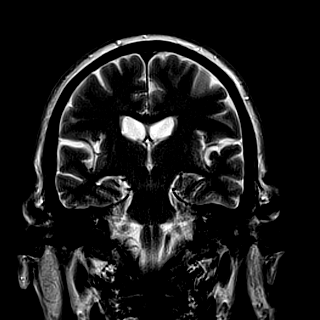
[im 26/26]
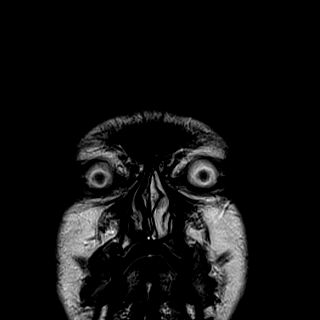

[Series 100: <mpr thick range> · axial · 3.0mm · 0.82mm/px · z∈[-68,+59]mm · 5 of 44 slices shown]
[im 1/44]
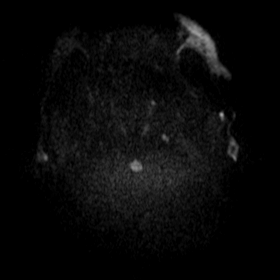
[im 11/44]
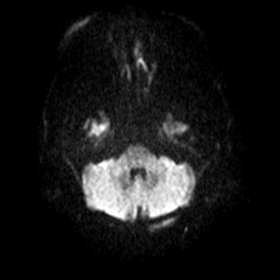
[im 22/44]
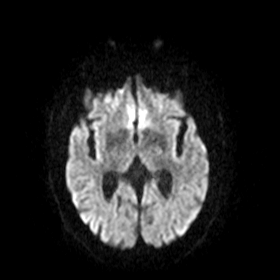
[im 33/44]
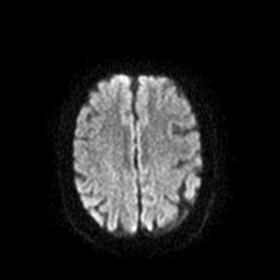
[im 44/44]
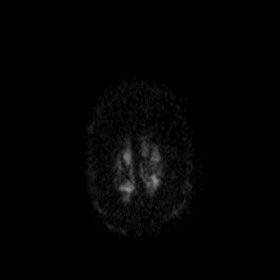

[Series 101: <mpr thick range(1)> · coronal · 3.0mm · 0.60mm/px · 6 of 55 slices shown]
[im 1/55]
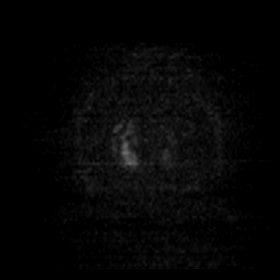
[im 10/55]
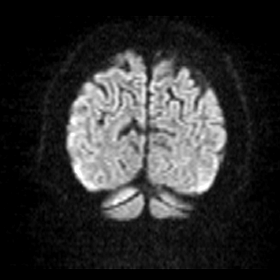
[im 19/55]
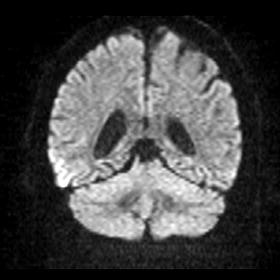
[im 28/55]
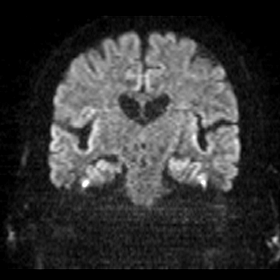
[im 37/55]
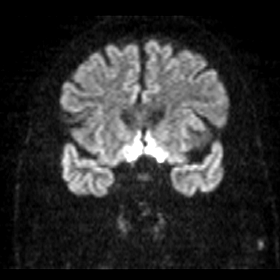
[im 46/55]
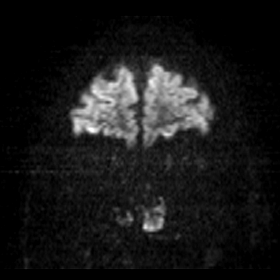

[35 of 48 positions shown; findings below may reference images not displayed]

FINDINGS: There is no evidence of acute infarct, intracranial hemorrhage,
mass, midline shift, or extra-axial fluid collection. Mild, age
appropriate cerebral atrophy is present. Small foci of T2
hyperintensity in the cerebral white matter are nonspecific but
compatible with minimal chronic small vessel ischemic disease.

Orbits are unremarkable. Paranasal sinuses are clear. There are
small bilateral mastoid effusions. Major intracranial vascular flow
voids are preserved.
IMPRESSION: 1. No acute intracranial abnormality.
2. Minimal chronic small vessel ischemic disease.

## 2016-04-06 ENCOUNTER — Ambulatory Visit: Payer: Medicaid Other | Admitting: Neurology

## 2016-05-08 ENCOUNTER — Encounter: Payer: Self-pay | Admitting: Family Medicine

## 2016-05-08 ENCOUNTER — Ambulatory Visit (INDEPENDENT_AMBULATORY_CARE_PROVIDER_SITE_OTHER): Payer: Medicaid Other | Admitting: Family Medicine

## 2016-05-08 ENCOUNTER — Encounter (INDEPENDENT_AMBULATORY_CARE_PROVIDER_SITE_OTHER): Payer: Self-pay

## 2016-05-08 VITALS — BP 162/89 | HR 67 | Temp 97.2°F | Ht 64.0 in | Wt 205.0 lb

## 2016-05-08 DIAGNOSIS — E119 Type 2 diabetes mellitus without complications: Secondary | ICD-10-CM | POA: Diagnosis not present

## 2016-05-08 DIAGNOSIS — I10 Essential (primary) hypertension: Secondary | ICD-10-CM

## 2016-05-08 LAB — CMP14+EGFR
A/G RATIO: 1.6 (ref 1.2–2.2)
ALK PHOS: 57 IU/L (ref 39–117)
ALT: 42 IU/L (ref 0–44)
AST: 24 IU/L (ref 0–40)
Albumin: 4.4 g/dL (ref 3.6–4.8)
BUN/Creatinine Ratio: 13 (ref 10–24)
BUN: 14 mg/dL (ref 8–27)
Bilirubin Total: 2.1 mg/dL — ABNORMAL HIGH (ref 0.0–1.2)
CHLORIDE: 102 mmol/L (ref 96–106)
CO2: 24 mmol/L (ref 18–29)
Calcium: 9.3 mg/dL (ref 8.6–10.2)
Creatinine, Ser: 1.05 mg/dL (ref 0.76–1.27)
GFR calc Af Amer: 88 mL/min/{1.73_m2} (ref >59)
GFR calc non Af Amer: 76 mL/min/{1.73_m2} (ref >59)
GLOBULIN, TOTAL: 2.7 g/dL (ref 1.5–4.5)
Glucose: 195 mg/dL — ABNORMAL HIGH (ref 65–99)
POTASSIUM: 3.9 mmol/L (ref 3.5–5.2)
Sodium: 140 mmol/L (ref 134–144)
Total Protein: 7.1 g/dL (ref 6.0–8.5)

## 2016-05-08 LAB — BAYER DCA HB A1C WAIVED: HB A1C: 9 % — AB (ref <7.0)

## 2016-05-08 LAB — LIPID PANEL
Chol/HDL Ratio: 6 ratio units — ABNORMAL HIGH (ref 0.0–5.0)
Cholesterol, Total: 149 mg/dL (ref 100–199)
HDL: 25 mg/dL — ABNORMAL LOW (ref >39)
LDL Calculated: 79 mg/dL (ref 0–99)
TRIGLYCERIDES: 226 mg/dL — AB (ref 0–149)
VLDL Cholesterol Cal: 45 mg/dL — ABNORMAL HIGH (ref 5–40)

## 2016-05-08 MED ORDER — METFORMIN HCL 1000 MG PO TABS: 1000.0000 mg | ORAL_TABLET | Freq: Two times a day (BID) | ORAL | Status: DC

## 2016-05-12 ENCOUNTER — Telehealth: Payer: Self-pay | Admitting: *Deleted

## 2016-05-12 NOTE — Telephone Encounter (Signed)
-----   Message from Sharion Balloon, [place] sent at 05/12/2016  8:30 AM EDT ----- Kidney and liver function stable LDL WNL, Triglycerides greatly improved, but still elevated- PT needs to be on low fat diet and exercise

## 2016-05-12 NOTE — Telephone Encounter (Signed)
Pt notified of results Verbalizes understanding 

## 2016-05-15 NOTE — Progress Notes (Signed)
BP (!) 162/89   Pulse 67   Temp 97.2 F (36.2 C) (Oral)   Ht _0  (1.626 m)   Wt 205 lb (93 kg)   BMI 35.19 kg/m    Subjective:    Patient ID: [name] [name], male    DOB: 10-Jun-1951, 65 y.o.   MRN: [id number]  HPI: [name] [name] is a 65 y.o. male presenting on 05/08/2016 for Establish Care   HPI Type 2 diabetes Patient comes in to establish care with our office as a new patient to [place]. He has known type 2 diabetes and is currently taking metformin 1000 mg twice a day. He has had a history of a TIA. From the stroke is been left with a little more weakness on the right than left. He denies any issues with his feet such as sores in his history. He has been out of his testing supplies so does not have any recent numbers. He cannot remember when his last hemoglobin A1c was. He denies any visual issues but has not seen an ophthalmologist in 2 years. He is currently on an ACE inhibitor. He is currently on aspirin 325 daily  Hypertension Patient is currently on lisinopril and metoprolol for his blood pressure. He has been out of the lisinopril for at least a few days. His blood pressure today is 162/89. Patient denies headaches, blurred vision, chest pains, shortness of breath. Denies any side effects from medication and is content with current medication. He does have some residual weakness from his previous stroke and TIA.  Relevant past medical, surgical, family and social history reviewed and updated as indicated. Interim medical history since our last visit reviewed. Allergies and medications reviewed and updated.  Review of Systems  Constitutional: Negative for fever.  HENT: Negative for congestion, ear discharge and ear pain.   Eyes: Negative for discharge and visual disturbance.  Respiratory: Negative for shortness of breath and wheezing.   Cardiovascular: Negative for chest pain and leg swelling.  Gastrointestinal: Negative for abdominal pain, constipation and diarrhea.    Genitourinary: Negative for difficulty urinating.  Musculoskeletal: Negative for back pain and gait problem.  Skin: Negative for color change and rash.  Neurological: Positive for weakness. Negative for dizziness, syncope, light-headedness and headaches.  All other systems reviewed and are negative.   Per HPI unless specifically indicated above  Social History   Social History  . Marital status: Married    Spouse name: N/A  . Number of children: 0  . Years of education: N/A   Occupational History  . Disabled    Social History Main Topics  . Smoking status: Never Smoker  . Smokeless tobacco: Never Used  . Alcohol use Yes     Comment: rarely  . Drug use: No  . Sexual activity: No   Other Topics Concern  . Not on file   Social History Narrative   Lives at home with his wife.   Right-handed.   2 cups caffeine coffee per day and a few sodas.   Has been on disability all his life due to significant decrease in vision and bowel problems             Past Surgical History:  Procedure Laterality Date  . RECTAL SURGERY     As an infant  . TONSILLECTOMY    . UMBILICAL HERNIA REPAIR      Family History  Problem Relation Age of Onset  . Dementia Other   . Alzheimer's disease Other   .  Diabetes Other   . Coronary artery disease Other   . Heart attack Other   . Cancer Other   . Lung cancer Other   . Sudden death Sister   . Colon cancer Neg Hx   . Cancer Mother     lung  . Cancer Father       Medication List       Accurate as of 05/08/16 11:59 PM. Always use your most recent med list.          ALPRAZolam 0.5 MG tablet Commonly known as:  XANAX Take 0.5 mg by mouth 2 (two) times daily as needed. Reported on 05/08/2016   aspirin 325 MG tablet Take 1 tablet (325 mg total) by mouth daily.   furosemide 20 MG tablet Commonly known as:  LASIX Take 20 mg by mouth daily.   gabapentin 300 MG capsule Commonly known as:  NEURONTIN Take 1 capsule (300 mg  total) by mouth 3 (three) times daily.   HYDROcodone-acetaminophen 5-325 MG tablet Commonly known as:  NORCO/VICODIN Take 1 tablet by mouth every 8 (eight) hours as needed for severe pain. Reported on 05/08/2016   lisinopril 20 MG tablet Commonly known as:  PRINIVIL,ZESTRIL Take 20 mg by mouth daily.   metFORMIN 1000 MG tablet Commonly known as:  GLUCOPHAGE Take 1 tablet (1,000 mg total) by mouth 2 (two) times daily with a meal.   metoprolol succinate 100 MG 24 hr tablet Commonly known as:  TOPROL-XL Take 100 mg by mouth daily.   mirtazapine 30 MG tablet Commonly known as:  REMERON Take 30 mg by mouth at bedtime.   nitroGLYCERIN 0.4 MG SL tablet Commonly known as:  NITROSTAT Place 0.4 mg under the tongue every 5 (five) minutes as needed for chest pain.   potassium chloride 10 MEQ tablet Commonly known as:  K-DUR,KLOR-CON Take 10 mEq by mouth 2 (two) times daily.   triamcinolone cream 0.1 % Commonly known as:  KENALOG Apply 1 application topically 2 (two) times daily.          Objective:    BP (!) 162/89   Pulse 67   Temp 97.2 F (36.2 C) (Oral)   Ht 5\' 4"  (1.626 m)   Wt 205 lb (93 kg)   BMI 35.19 kg/m   Wt Readings from Last 3 Encounters:  05/08/16 205 lb (93 kg)  08/19/15 210 lb (95.3 kg)  05/05/15 207 lb 7.3 oz (94.1 kg)    Physical Exam  Constitutional: He is oriented to person, place, and time. He appears well-developed and well-nourished. No distress.  HENT:  Right Ear: External ear normal.  Left Ear: External ear normal.  Nose: Nose normal.  Mouth/Throat: Oropharynx is clear and moist. No oropharyngeal exudate.  Eyes: Conjunctivae and EOM are normal. Pupils are equal, round, and reactive to light. Right eye exhibits no discharge. No scleral icterus.  Neck: Neck supple. No thyromegaly present.  Cardiovascular: Normal rate, regular rhythm, normal heart sounds and intact distal pulses.   No murmur heard. Pulmonary/Chest: Effort normal and breath  sounds normal. No respiratory distress. He has no wheezes.  Musculoskeletal: Normal range of motion. He exhibits no edema.  Lymphadenopathy:    He has no cervical adenopathy.  Neurological: He is alert and oriented to person, place, and time. He exhibits abnormal muscle tone (Slight weakness in right lower extremity, 4 out of 5). Coordination normal.  Skin: Skin is warm and dry. No rash noted. He is not diaphoretic.  Psychiatric: He has a  normal mood and affect. His behavior is normal.  Nursing note and vitals reviewed.      Assessment & Plan:   Problem List Items Addressed This Visit      Cardiovascular and Mediastinum   Essential hypertension - Primary   Relevant Orders   Lipid panel (Completed)     Endocrine   Diabetes ([place])   Relevant Medications   metFORMIN (GLUCOPHAGE) 1000 MG tablet   Other Relevant Orders   Bayer DCA Hb A1c Waived (Completed)   CMP14+EGFR (Completed)   Ambulatory referral to Ophthalmology    Other Visit Diagnoses   None.      Follow up plan: Return in about 4 weeks (around 06/05/2016), or if symptoms worsen or fail to improve, for Hypertension recheck.  Caryl Pina, MD [facility] 05/08/2016, 4:40 PM

## 2016-06-07 ENCOUNTER — Encounter: Payer: Self-pay | Admitting: Family Medicine

## 2016-06-07 ENCOUNTER — Ambulatory Visit (INDEPENDENT_AMBULATORY_CARE_PROVIDER_SITE_OTHER): Payer: Medicaid Other | Admitting: Family Medicine

## 2016-06-07 VITALS — BP 197/111 | HR 68 | Temp 97.0°F | Ht 64.0 in | Wt 212.0 lb

## 2016-06-07 DIAGNOSIS — G5791 Unspecified mononeuropathy of right lower limb: Secondary | ICD-10-CM | POA: Diagnosis not present

## 2016-06-07 DIAGNOSIS — I10 Essential (primary) hypertension: Secondary | ICD-10-CM | POA: Diagnosis not present

## 2016-06-07 DIAGNOSIS — F418 Other specified anxiety disorders: Secondary | ICD-10-CM

## 2016-06-07 DIAGNOSIS — F419 Anxiety disorder, unspecified: Secondary | ICD-10-CM

## 2016-06-07 DIAGNOSIS — M792 Neuralgia and neuritis, unspecified: Secondary | ICD-10-CM

## 2016-06-07 DIAGNOSIS — F32A Depression, unspecified: Secondary | ICD-10-CM | POA: Insufficient documentation

## 2016-06-07 DIAGNOSIS — F329 Major depressive disorder, single episode, unspecified: Secondary | ICD-10-CM | POA: Insufficient documentation

## 2016-06-07 MED ORDER — AMITRIPTYLINE HCL 25 MG PO TABS
25.0000 mg | ORAL_TABLET | Freq: Every day | ORAL | 1 refills | Status: DC
Start: 2016-06-07 — End: 2016-07-05

## 2016-06-07 MED ORDER — HYDROCODONE-ACETAMINOPHEN 5-325 MG PO TABS: 1.0000 | ORAL_TABLET | Freq: Three times a day (TID) | ORAL | 0 refills | Status: DC | PRN

## 2016-06-07 MED ORDER — AMLODIPINE BESYLATE 5 MG PO TABS: 5.0000 mg | ORAL_TABLET | Freq: Every day | ORAL | 1 refills | Status: DC

## 2016-06-07 NOTE — Assessment & Plan Note (Signed)
Still uncontrolled, will add Norvasc

## 2016-06-07 NOTE — Progress Notes (Signed)
BP (!) 197/111   Pulse 68   Temp 97 F (36.1 C) (Oral)   Ht 5\' 4"  (1.626 m)   Wt 212 lb (96.2 kg)   BMI 36.39 kg/m    Subjective:    Patient ID: [name] [name], male    DOB: 1951-08-16, 65 y.o.   MRN: [id number]  HPI: [name] [name] is a 65 y.o. male presenting on 06/07/2016 for Hypertension (followup) and Medication Refill (patient requesting Xanax & Hydrocodone)   HPI Hypertension recheck Patient is coming in today for hypertension recheck. His blood pressure today is 197/111. Patient is currently on lisinopril 20 mg and metoprolol 100 mg daily. He says he took early in the morning and should refract where his blood pressure is. He says his blood pressures at home have been running higher as well. Patient denies headaches, blurred vision, chest pains, shortness of breath, or weakness. Denies any side effects from medication and is content with current medication.   Anxiety and depression and neurogenic pain in leg. Patient has been having increased anxiety and depression and feelings of sadness and anxiousness. He has not been sleeping as well as night. He denies any suicidal ideations. He has been having some difficulty sleeping at night. Currently he is on Neurontin for his neurogenic pain in his right leg and does not feel it has been helping much. He would like to try something different. He has also been on the Remeron for anxiety and depression and sleep and does not like the way that makes him feel.  Relevant past medical, surgical, family and social history reviewed and updated as indicated. Interim medical history since our last visit reviewed. Allergies and medications reviewed and updated.  Review of Systems  Constitutional: Negative for fever.  HENT: Negative for ear discharge and ear pain.   Eyes: Negative for discharge and visual disturbance.  Respiratory: Negative for shortness of breath and wheezing.   Cardiovascular: Negative for chest pain and leg swelling.    Gastrointestinal: Negative for abdominal pain, constipation and diarrhea.  Genitourinary: Negative for difficulty urinating.  Musculoskeletal: Negative for back pain and gait problem.  Skin: Negative for rash.  Neurological: Positive for numbness (And burning sensation in right lateral thigh). Negative for syncope, weakness, light-headedness and headaches.  Psychiatric/Behavioral: Positive for decreased concentration, dysphoric mood and sleep disturbance. Negative for self-injury and suicidal ideas. The patient is nervous/anxious.   All other systems reviewed and are negative.   Per HPI unless specifically indicated above     Medication List       Accurate as of 06/07/16 10:02 AM. Always use your most recent med list.          amitriptyline 25 MG tablet Commonly known as:  ELAVIL Take 1 tablet (25 mg total) by mouth at bedtime.   amLODipine 5 MG tablet Commonly known as:  NORVASC Take 1 tablet (5 mg total) by mouth daily.   aspirin 325 MG tablet Take 1 tablet (325 mg total) by mouth daily.   furosemide 20 MG tablet Commonly known as:  LASIX Take 20 mg by mouth daily.   gabapentin 300 MG capsule Commonly known as:  NEURONTIN Take 1 capsule (300 mg total) by mouth 3 (three) times daily.   HYDROcodone-acetaminophen 5-325 MG tablet Commonly known as:  NORCO/VICODIN Take 1 tablet by mouth every 8 (eight) hours as needed for severe pain. Reported on 05/08/2016   lisinopril 20 MG tablet Commonly known as:  PRINIVIL,ZESTRIL Take 20 mg by mouth  daily.   metFORMIN 1000 MG tablet Commonly known as:  GLUCOPHAGE Take 1 tablet (1,000 mg total) by mouth 2 (two) times daily with a meal.   metoprolol succinate 100 MG 24 hr tablet Commonly known as:  TOPROL-XL Take 100 mg by mouth daily.   nitroGLYCERIN 0.4 MG SL tablet Commonly known as:  NITROSTAT Place 0.4 mg under the tongue every 5 (five) minutes as needed for chest pain.   potassium chloride 10 MEQ tablet Commonly  known as:  K-DUR,KLOR-CON Take 10 mEq by mouth 2 (two) times daily.   triamcinolone cream 0.1 % Commonly known as:  KENALOG Apply 1 application topically 2 (two) times daily.          Objective:    BP (!) 197/111   Pulse 68   Temp 97 F (36.1 C) (Oral)   Ht 5\' 4"  (1.626 m)   Wt 212 lb (96.2 kg)   BMI 36.39 kg/m   Wt Readings from Last 3 Encounters:  06/07/16 212 lb (96.2 kg)  05/08/16 205 lb (93 kg)  08/19/15 210 lb (95.3 kg)    Physical Exam  Constitutional: He is oriented to person, place, and time. He appears well-developed and well-nourished. No distress.  Eyes: Conjunctivae and EOM are normal. Pupils are equal, round, and reactive to light. Right eye exhibits no discharge. No scleral icterus.  Neck: Neck supple. No thyromegaly present.  Cardiovascular: Normal rate, regular rhythm, normal heart sounds and intact distal pulses.   No murmur heard. Pulmonary/Chest: Effort normal and breath sounds normal. No respiratory distress. He has no wheezes.  Musculoskeletal: Normal range of motion. He exhibits no edema.       Lumbar back: He exhibits normal range of motion, no tenderness (Negative straight leg raise), no bony tenderness and no swelling.       Right upper leg: He exhibits no tenderness (No tenderness to palpation but patient describes shooting pain).  Lymphadenopathy:    He has no cervical adenopathy.  Neurological: He is alert and oriented to person, place, and time. He has normal strength and normal reflexes. No cranial nerve deficit or sensory deficit. Coordination normal.  Skin: Skin is warm and dry. No rash noted. He is not diaphoretic.  Psychiatric: His behavior is normal. Thought content normal. His mood appears anxious. He exhibits a depressed mood. He expresses no suicidal ideation. He expresses no suicidal plans.  Nursing note and vitals reviewed.     Assessment & Plan:   Problem List Items Addressed This Visit      Cardiovascular and Mediastinum    Essential hypertension - Primary    Still uncontrolled, will add Norvasc      Relevant Medications   amLODipine (NORVASC) 5 MG tablet     Other   Anxiety and depression   Relevant Medications   amitriptyline (ELAVIL) 25 MG tablet    Other Visit Diagnoses    Neurogenic pain, leg, right       Relevant Medications   HYDROcodone-acetaminophen (NORCO/VICODIN) 5-325 MG tablet       Follow up plan: Return in about 4 weeks (around 07/05/2016), or if symptoms worsen or fail to improve, for Pain contract initiation, Recheck anxiety and depression and hypertension.  Counseling provided for all of the vaccine components No orders of the defined types were placed in this encounter.   Caryl Pina, MD [facility] 06/07/2016, 10:02 AM

## 2016-06-08 ENCOUNTER — Telehealth: Payer: Self-pay | Admitting: Family Medicine

## 2016-06-08 NOTE — Telephone Encounter (Signed)
Spoke with pharmacist regarding new RX for Amlodipine Confirmed that this med is in addition to Lisinopril Verbalizes understanding

## 2016-06-09 ENCOUNTER — Other Ambulatory Visit: Payer: Self-pay | Admitting: Family Medicine

## 2016-06-13 ENCOUNTER — Ambulatory Visit: Payer: Medicaid Other | Admitting: Neurology

## 2016-07-05 ENCOUNTER — Ambulatory Visit (INDEPENDENT_AMBULATORY_CARE_PROVIDER_SITE_OTHER): Payer: Medicaid Other | Admitting: Family Medicine

## 2016-07-05 ENCOUNTER — Encounter: Payer: Self-pay | Admitting: Family Medicine

## 2016-07-05 VITALS — BP 173/86 | HR 66 | Temp 98.0°F | Ht 64.0 in | Wt 206.5 lb

## 2016-07-05 DIAGNOSIS — F418 Other specified anxiety disorders: Secondary | ICD-10-CM

## 2016-07-05 DIAGNOSIS — Z0289 Encounter for other administrative examinations: Secondary | ICD-10-CM

## 2016-07-05 DIAGNOSIS — I10 Essential (primary) hypertension: Secondary | ICD-10-CM | POA: Diagnosis not present

## 2016-07-05 DIAGNOSIS — Z79899 Other long term (current) drug therapy: Secondary | ICD-10-CM | POA: Diagnosis not present

## 2016-07-05 DIAGNOSIS — F1120 Opioid dependence, uncomplicated: Secondary | ICD-10-CM | POA: Diagnosis not present

## 2016-07-05 DIAGNOSIS — M792 Neuralgia and neuritis, unspecified: Secondary | ICD-10-CM

## 2016-07-05 DIAGNOSIS — G5791 Unspecified mononeuropathy of right lower limb: Secondary | ICD-10-CM

## 2016-07-05 DIAGNOSIS — F329 Major depressive disorder, single episode, unspecified: Secondary | ICD-10-CM

## 2016-07-05 DIAGNOSIS — F419 Anxiety disorder, unspecified: Secondary | ICD-10-CM

## 2016-07-05 MED ORDER — AMITRIPTYLINE HCL 50 MG PO TABS: 50.0000 mg | ORAL_TABLET | Freq: Every day | ORAL | 1 refills | Status: DC

## 2016-07-05 MED ORDER — HYDROCODONE-ACETAMINOPHEN 5-325 MG PO TABS: 1.0000 | ORAL_TABLET | Freq: Three times a day (TID) | ORAL | 0 refills | Status: DC | PRN

## 2016-07-05 NOTE — Assessment & Plan Note (Signed)
Continue to monitor blood pressures, we'll have back in 4 weeks, he is very stressed this week because of a death in the family so will not adjust medications today.

## 2016-07-05 NOTE — Progress Notes (Signed)
[place] Controlled Substance Abuse database reviewed- Yes If yes- were their any concerning findings : None Depression screen [facility] 2/9 07/05/2016 06/07/2016 05/08/2016  Decreased Interest 2 1 0  Down, Depressed, Hopeless 3 0 0  PHQ - 2 Score 5 1 0  Altered sleeping 3 - -  Tired, decreased energy 2 - -  Change in appetite 0 - -  Feeling bad or failure about yourself  1 - -  Trouble concentrating 1 - -  Moving slowly or fidgety/restless 0 - -  Suicidal thoughts 0 - -  PHQ-9 Score 12 - -  Difficult doing work/chores Somewhat difficult - -    No flowsheet data found.  [employer] drug screen performed- Yes  SOAPP  0= never  1= seldom  2=sometimes  3= often  4= very often  How often do you have mood swings? 1 How often do you smoke a cigarette within an hour after waling up? 0 How often have you taken medication other than the way that it was prescribed?0 How often have you used illegal drugs in the past 5 years? 0 How often, in your lifetime, have you had legal problems or been arrested? 0  Score 1  Alcohol Audit - How often during the last year have found that you: 0-Never   1- Less than monthly   2- Monthly     3-Weekly     4-daily or almost daily  - found that you were not able to stop drinking once you started- 0 -failed to do what was normally expected of you because of drinking- 0 -needed a first drink in the morning- 0 -had a feeling of guilt or remorse after drinking- 0 -are/were unable to remember what happened the night before because of your drinking- 0  0- NO   2- yes but not in last year  4- yes during last year -Have you or someone else been injured because of your drinking- 0 - Has anyone been concerned about your drinking or suggested you cut down- 0        TOTAL- 0  ( 0-7- alcohol education, 8-15- simple advice, 16-19 simple advice plus counseling, 20-40 referral for evaluation and treatment 0   Designated Pharmacy- [facility]  Pain  assessment: Cause of pain- 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 He appears well-developed and well-nourished. No distress.  Eyes: Conjunctivae and EOM are normal. Pupils are equal, round, and reactive to light. Right eye exhibits no discharge. No scleral icterus.  Neck: Neck supple. No thyromegaly present.  Cardiovascular: Normal rate, regular rhythm, normal heart sounds and intact distal pulses.   No murmur heard. Pulmonary/Chest: Effort normal and breath sounds normal. No respiratory distress. He has no wheezes.  Musculoskeletal: Normal range of motion. He exhibits tenderness (Bilateral low back tenderness, negative straight leg raise bilaterally). He exhibits no edema.  Lymphadenopathy:    He has no cervical adenopathy.  Neurological: He is alert and oriented to person, place, and time. Coordination normal.  Skin: Skin is warm and  dry. No rash noted. He is not diaphoretic.  Psychiatric: His speech is normal and behavior is normal. His mood appears anxious. He exhibits a depressed mood. He expresses no suicidal ideation. He expresses no suicidal plans.  Nursing note and vitals reviewed.  Problem List Items Addressed This Visit      Cardiovascular and Mediastinum   Essential hypertension - Primary    Continue to monitor blood pressures, we'll have back in 4 weeks, he is very stressed this week because of a death in the family so will not adjust medications today.        Other    Anxiety and depression    Sleeping better but not helping sufficiently with anxiety, will increase to 50 mg      Relevant Medications   amitriptyline (ELAVIL) 50 MG tablet    Other Visit Diagnoses    Pain medication agreement signed       Relevant Medications   HYDROcodone-acetaminophen (NORCO/VICODIN) 5-325 MG tablet   Uncomplicated opioid dependence (HCC)       Relevant Medications   HYDROcodone-acetaminophen (NORCO/VICODIN) 5-325 MG tablet   Neurogenic pain, leg, right       Relevant Medications   HYDROcodone-acetaminophen (NORCO/VICODIN) 5-325 MG tablet

## 2016-07-05 NOTE — Assessment & Plan Note (Signed)
Sleeping better but not helping sufficiently with anxiety, will increase to 50 mg

## 2016-07-07 ENCOUNTER — Other Ambulatory Visit: Payer: Self-pay | Admitting: *Deleted

## 2016-07-07 ENCOUNTER — Other Ambulatory Visit: Payer: Self-pay | Admitting: Family Medicine

## 2016-07-07 DIAGNOSIS — I10 Essential (primary) hypertension: Secondary | ICD-10-CM

## 2016-07-11 LAB — TOXASSURE SELECT 13 (MW), URINE

## 2016-07-12 ENCOUNTER — Ambulatory Visit: Payer: Medicaid Other | Admitting: Neurology

## 2016-07-31 ENCOUNTER — Other Ambulatory Visit: Payer: Self-pay | Admitting: Family Medicine

## 2016-08-02 ENCOUNTER — Encounter: Payer: Self-pay | Admitting: Family Medicine

## 2016-08-02 ENCOUNTER — Ambulatory Visit (INDEPENDENT_AMBULATORY_CARE_PROVIDER_SITE_OTHER): Payer: Medicaid Other | Admitting: Family Medicine

## 2016-08-02 VITALS — BP 161/87 | HR 57 | Temp 97.2°F | Ht 64.0 in | Wt 202.4 lb

## 2016-08-02 DIAGNOSIS — F1120 Opioid dependence, uncomplicated: Secondary | ICD-10-CM | POA: Diagnosis not present

## 2016-08-02 DIAGNOSIS — I10 Essential (primary) hypertension: Secondary | ICD-10-CM | POA: Diagnosis not present

## 2016-08-02 DIAGNOSIS — G5791 Unspecified mononeuropathy of right lower limb: Secondary | ICD-10-CM

## 2016-08-02 DIAGNOSIS — E119 Type 2 diabetes mellitus without complications: Secondary | ICD-10-CM

## 2016-08-02 DIAGNOSIS — G4733 Obstructive sleep apnea (adult) (pediatric): Secondary | ICD-10-CM

## 2016-08-02 DIAGNOSIS — M792 Neuralgia and neuritis, unspecified: Secondary | ICD-10-CM

## 2016-08-02 LAB — BAYER DCA HB A1C WAIVED: HB A1C (BAYER DCA - WAIVED): 7.7 % — ABNORMAL HIGH (ref <7.0)

## 2016-08-02 MED ORDER — HYDROCODONE-ACETAMINOPHEN 5-325 MG PO TABS: 1.0000 | ORAL_TABLET | Freq: Every day | ORAL | 0 refills | Status: DC | PRN

## 2016-08-02 NOTE — Progress Notes (Signed)
BP (!) 168/91   Pulse (!) 59   Temp 97.2 F (36.2 C) (Oral)   Ht 5' 4"  (1.626 m)   Wt 202 lb 6 oz (91.8 kg)   BMI 34.74 kg/m    Subjective:    Patient ID: [name] [name], male    DOB: 04/28/1951, 65 y.o.   MRN: [id number]  HPI: [name] [name] is a 65 y.o. male presenting on 08/02/2016 for Hypertension (followup; patient reports that blood pressures at home are varying)   HPI Hypertension recheck Patient comes in today for a hypertension recheck. He says at home his blood pressures have been varying them ranging between 140s and 150s to up over 833 systolic. He says the diastolic typically falls between 70 and 90. Most of the time though it's in the 130s and 140s. He is currently on amlodipine and lisinopril and metoprolol. He also admits to having at times dizziness when he gets up quickly often to the point where he has to sit back down so that he doesn't fall or faint. Patient denies headaches, blurred vision, chest pains, shortness of breath, or weakness. Denies any side effects from medication and is content with current medication.   Type 2 diabetes recheck Patient is currently using metformin and is due for an A1c rechecked. He does not typically check his blood sugars as he isn't doing well. He is currently on an ACE inhibitor and denies any issues with it. He denies any issues with his feet. He has not yet seen an ophthalmologist this year.  Pain medication refill for neurogenic pain Patient has nerve pain from his history of back issues that goes down his right leg but does not cause any numbness or weakness or loss of bowel or loss of bladder. He is on medications for this and is doing well on his current dose of medications.  Sleep apnea symptoms Patient has snoring while he sleeps and his wife tells him sometimes he stops reading and gas for air and he also has non-refreshing sleep in the a.m. He has headaches that time and can fall asleep anytime he sits down for a  prolonged period during the day.  Relevant past medical, surgical, family and social history reviewed and updated as indicated. Interim medical history since our last visit reviewed. Allergies and medications reviewed and updated.  Review of Systems  Constitutional: Positive for fatigue. Negative for chills and fever.  Eyes: Negative for discharge.  Respiratory: Negative for choking, shortness of breath and wheezing.   Cardiovascular: Negative for chest pain and leg swelling.  Endocrine: Negative for cold intolerance, heat intolerance, polydipsia and polyuria.  Musculoskeletal: Positive for back pain and myalgias. Negative for gait problem.  Skin: Negative for rash.  Neurological: Positive for dizziness and light-headedness. Negative for speech difficulty, weakness, numbness and headaches.  Psychiatric/Behavioral: Positive for sleep disturbance.  All other systems reviewed and are negative.   Per HPI unless specifically indicated above     Medication List       Accurate as of 08/02/16 11:39 AM. Always use your most recent med list.          amitriptyline 50 MG tablet Commonly known as:  ELAVIL Take 1 tablet (50 mg total) by mouth at bedtime.   amLODipine 5 MG tablet Commonly known as:  NORVASC TAKE ONE TABLET BY MOUTH DAILY. (IN THE EVENING)   aspirin 325 MG tablet Take 1 tablet (325 mg total) by mouth daily.   furosemide 20 MG tablet  Commonly known as:  LASIX TAKE ONE TABLET BY MOUTH IN THE MORNING FOR BLOOD PRESSURE AND FLUID (MORNING)   gabapentin 300 MG capsule Commonly known as:  NEURONTIN TAKE ONE CAPSULE BY MOUTH THREE TIMES DAILY (MORNING, NOON, EVENING)   HYDROcodone-acetaminophen 5-325 MG tablet Commonly known as:  NORCO/VICODIN Take 1 tablet by mouth daily as needed for severe pain. Reported on 05/08/2016   HYDROcodone-acetaminophen 5-325 MG tablet Commonly known as:  NORCO/VICODIN Take 1 tablet by mouth daily as needed for moderate pain. Do not refill  until 30 days from prescription date   HYDROcodone-acetaminophen 5-325 MG tablet Commonly known as:  NORCO/VICODIN Take 1 tablet by mouth daily as needed for moderate pain. Do not refill until 60 days from prescription   lisinopril 20 MG tablet Commonly known as:  PRINIVIL,ZESTRIL TAKE ONE TABLET BY MOUTH IN THE MORNING FOR BLOOD PRESSURE   metFORMIN 1000 MG tablet Commonly known as:  GLUCOPHAGE Take 1 tablet (1,000 mg total) by mouth 2 (two) times daily with a meal.   metoprolol succinate 100 MG 24 hr tablet Commonly known as:  TOPROL-XL Take 100 mg by mouth daily.   nitroGLYCERIN 0.4 MG SL tablet Commonly known as:  NITROSTAT Place 0.4 mg under the tongue every 5 (five) minutes as needed for chest pain.   potassium chloride 10 MEQ tablet Commonly known as:  K-DUR,KLOR-CON Take 10 mEq by mouth 2 (two) times daily.   triamcinolone cream 0.1 % Commonly known as:  KENALOG Apply 1 application topically 2 (two) times daily.          Objective:    BP (!) 168/91   Pulse (!) 59   Temp 97.2 F (36.2 C) (Oral)   Ht 5' 4"  (1.626 m)   Wt 202 lb 6 oz (91.8 kg)   BMI 34.74 kg/m   Wt Readings from Last 3 Encounters:  08/02/16 202 lb 6 oz (91.8 kg)  07/05/16 206 lb 8 oz (93.7 kg)  06/07/16 212 lb (96.2 kg)    Physical Exam  Constitutional: He is oriented to person, place, and time. He appears well-developed and well-nourished. No distress.  Eyes: Conjunctivae are normal. Right eye exhibits no discharge. Left eye exhibits no discharge. No scleral icterus.  Cardiovascular: Normal rate, regular rhythm, normal heart sounds and intact distal pulses.   No murmur heard. Pulmonary/Chest: Effort normal and breath sounds normal. No respiratory distress. He has no wheezes. He has no rales.  Musculoskeletal: Normal range of motion. He exhibits no edema.  Neurological: He is alert and oriented to person, place, and time. Coordination normal.  Skin: Skin is warm and dry. No rash noted. He  is not diaphoretic.  Psychiatric: He has a normal mood and affect. His behavior is normal.  Nursing note and vitals reviewed.     Assessment & Plan:   Problem List Items Addressed This Visit      Cardiovascular and Mediastinum   Essential hypertension - Primary    BP still up here in the office, at home he typically runs between 130s and 140s. I am hesitant to increase his blood pressure medications because he has been having orthostatic dizziness quite frequently and I do not want to make her more lightheaded and caused the fall. We will continue to monitor. He may have whitecoat syndrome        Endocrine   Diabetes ([place])   Relevant Orders   CMP14+EGFR (Completed)   Bayer DCA Hb A1c Waived (Completed)    Other Visit  Diagnoses    Neurogenic pain, leg, right       Relevant Medications   HYDROcodone-acetaminophen (NORCO/VICODIN) 5-325 MG tablet   HYDROcodone-acetaminophen (NORCO/VICODIN) 5-325 MG tablet   HYDROcodone-acetaminophen (NORCO/VICODIN) 5-325 MG tablet   Uncomplicated opioid dependence ([facility])       Relevant Medications   HYDROcodone-acetaminophen (NORCO/VICODIN) 5-325 MG tablet   HYDROcodone-acetaminophen (NORCO/VICODIN) 5-325 MG tablet   HYDROcodone-acetaminophen (NORCO/VICODIN) 5-325 MG tablet   OSA (obstructive sleep apnea)       Relevant Orders   Ambulatory referral to Sleep Studies       Follow up plan: Return in about 3 months (around 11/02/2016), or if symptoms worsen or fail to improve, for Hypertension recheck and diabetes.  Counseling provided for all of the vaccine components Orders Placed This Encounter  Procedures  . CMP14+EGFR  . Bayer [facility] Hb A1c [place], MD [facility] 08/02/2016, 11:39 AM

## 2016-08-02 NOTE — Assessment & Plan Note (Addendum)
BP still up here in the office, at home he typically runs between 130s and 140s. I am hesitant to increase his blood pressure medications because he has been having orthostatic dizziness quite frequently and I do not want to make her more lightheaded and caused the fall. We will continue to monitor. He may have whitecoat syndrome

## 2016-08-03 LAB — CMP14+EGFR
A/G RATIO: 1.6 (ref 1.2–2.2)
ALT: 51 IU/L — AB (ref 0–44)
AST: 39 IU/L (ref 0–40)
Albumin: 4.6 g/dL (ref 3.6–4.8)
Alkaline Phosphatase: 71 IU/L (ref 39–117)
BUN/Creatinine Ratio: 10 (ref 10–24)
BUN: 11 mg/dL (ref 8–27)
Bilirubin Total: 3 mg/dL — ABNORMAL HIGH (ref 0.0–1.2)
CALCIUM: 9.7 mg/dL (ref 8.6–10.2)
CHLORIDE: 97 mmol/L (ref 96–106)
CO2: 26 mmol/L (ref 18–29)
Creatinine, Ser: 1.15 mg/dL (ref 0.76–1.27)
GFR calc Af Amer: 78 mL/min/{1.73_m2} (ref >59)
GFR, EST NON AFRICAN AMERICAN: 67 mL/min/{1.73_m2} (ref >59)
Globulin, Total: 2.9 g/dL (ref 1.5–4.5)
Glucose: 200 mg/dL — ABNORMAL HIGH (ref 65–99)
POTASSIUM: 3.8 mmol/L (ref 3.5–5.2)
Sodium: 143 mmol/L (ref 134–144)
Total Protein: 7.5 g/dL (ref 6.0–8.5)

## 2016-08-04 ENCOUNTER — Other Ambulatory Visit: Payer: Self-pay | Admitting: Family Medicine

## 2016-08-04 DIAGNOSIS — F32A Depression, unspecified: Secondary | ICD-10-CM

## 2016-08-04 DIAGNOSIS — F419 Anxiety disorder, unspecified: Secondary | ICD-10-CM

## 2016-08-04 DIAGNOSIS — F329 Major depressive disorder, single episode, unspecified: Secondary | ICD-10-CM

## 2016-08-14 ENCOUNTER — Institutional Professional Consult (permissible substitution): Payer: Medicaid Other | Admitting: Neurology

## 2016-08-16 ENCOUNTER — Institutional Professional Consult (permissible substitution): Payer: Medicaid Other | Admitting: Neurology

## 2016-09-01 ENCOUNTER — Other Ambulatory Visit: Payer: Self-pay | Admitting: Family Medicine

## 2016-10-02 ENCOUNTER — Other Ambulatory Visit: Payer: Self-pay | Admitting: Family Medicine

## 2016-10-26 ENCOUNTER — Other Ambulatory Visit: Payer: Self-pay | Admitting: Family Medicine

## 2016-10-26 DIAGNOSIS — F329 Major depressive disorder, single episode, unspecified: Secondary | ICD-10-CM

## 2016-10-26 DIAGNOSIS — F419 Anxiety disorder, unspecified: Secondary | ICD-10-CM

## 2016-10-26 DIAGNOSIS — F32A Depression, unspecified: Secondary | ICD-10-CM

## 2016-10-31 ENCOUNTER — Other Ambulatory Visit: Payer: Self-pay | Admitting: Family Medicine

## 2016-11-23 ENCOUNTER — Other Ambulatory Visit: Payer: Self-pay | Admitting: Family Medicine

## 2016-11-23 DIAGNOSIS — F1120 Opioid dependence, uncomplicated: Secondary | ICD-10-CM

## 2016-11-23 DIAGNOSIS — M792 Neuralgia and neuritis, unspecified: Secondary | ICD-10-CM

## 2016-12-16 ENCOUNTER — Other Ambulatory Visit: Payer: Self-pay | Admitting: Family Medicine

## 2016-12-16 DIAGNOSIS — F329 Major depressive disorder, single episode, unspecified: Secondary | ICD-10-CM

## 2016-12-16 DIAGNOSIS — F419 Anxiety disorder, unspecified: Secondary | ICD-10-CM

## 2016-12-21 ENCOUNTER — Other Ambulatory Visit: Payer: Self-pay | Admitting: Family Medicine

## 2016-12-21 DIAGNOSIS — I10 Essential (primary) hypertension: Secondary | ICD-10-CM

## 2016-12-22 ENCOUNTER — Ambulatory Visit (INDEPENDENT_AMBULATORY_CARE_PROVIDER_SITE_OTHER): Payer: Medicaid Other | Admitting: Family Medicine

## 2016-12-22 ENCOUNTER — Encounter: Payer: Self-pay | Admitting: Family Medicine

## 2016-12-22 VITALS — BP 132/88 | HR 73 | Temp 97.7°F | Ht 64.0 in | Wt 204.0 lb

## 2016-12-22 DIAGNOSIS — E119 Type 2 diabetes mellitus without complications: Secondary | ICD-10-CM | POA: Diagnosis not present

## 2016-12-22 DIAGNOSIS — M792 Neuralgia and neuritis, unspecified: Secondary | ICD-10-CM

## 2016-12-22 DIAGNOSIS — G5791 Unspecified mononeuropathy of right lower limb: Secondary | ICD-10-CM | POA: Diagnosis not present

## 2016-12-22 DIAGNOSIS — F1120 Opioid dependence, uncomplicated: Secondary | ICD-10-CM

## 2016-12-22 DIAGNOSIS — I10 Essential (primary) hypertension: Secondary | ICD-10-CM

## 2016-12-22 DIAGNOSIS — Z1159 Encounter for screening for other viral diseases: Secondary | ICD-10-CM

## 2016-12-22 LAB — BAYER DCA HB A1C WAIVED: HB A1C (BAYER DCA - WAIVED): 10.5 % — ABNORMAL HIGH (ref <7.0)

## 2016-12-22 MED ORDER — HYDROCODONE-ACETAMINOPHEN 5-325 MG PO TABS: 1.0000 | ORAL_TABLET | Freq: Every day | ORAL | 0 refills | Status: DC | PRN

## 2016-12-22 MED ORDER — DULOXETINE HCL 30 MG PO CPEP: 30.0000 mg | ORAL_CAPSULE | Freq: Every day | ORAL | 1 refills | Status: DC

## 2016-12-22 NOTE — Progress Notes (Signed)
BP 132/88   Pulse 73   Temp 97.7 F (36.5 C) (Oral)   Ht _0  (1.626 m)   Wt 204 lb (92.5 kg)   BMI 35.02 kg/m    Subjective:    Patient ID: [name] [name], male    DOB: [birth year], 66 y.o.   MRN: [id number]  HPI: [name] [name] is a 66 y.o. male presenting on 12/22/2016 for Diabetes (followup and labwork; patient is fasting); Hypertension; and Medication Refill (Hydrocodone)   HPI Hypertension Patient is coming in for hypertension recheck. His blood pressure is 132/88. He is currently on Norvasc and metoprolol and lisinopril. Patient denies headaches, blurred vision, chest pains, shortness of breath, or weakness. Denies any side effects from medication and is content with current medication.   Type 2 diabetes mellitus Patient comes in today for recheck of his diabetes. He has been currently taking metformin. He is currently on an ACE inhibitor. He has seen an ophthalmologist this year. 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  Leg pain and neuropathy in right leg Patient fights chronic neuropathy in his right leg is been going on for quite some time. He is currently on Neurontin and he just feels like it makes him sleepy and does not help with the pain at all. He also takes hydrocodone for it as needed. He would like to try something different if possible but doesn't make him so sleepy that can help with the nerve pain.  Relevant past medical, surgical, family and social history reviewed and updated as indicated. Interim medical history since our last visit reviewed. Allergies and medications reviewed and updated.  Review of Systems  Constitutional: Negative for chills and fever.  Respiratory: Negative for shortness of breath and wheezing.   Cardiovascular: Negative for chest pain and leg swelling.  Musculoskeletal: Positive for gait problem (Uses  a cane to walk). Negative for arthralgias, back pain and myalgias.  Skin: Negative for rash.  Neurological: Positive for weakness and numbness. Negative for dizziness.  All other systems reviewed and are negative.   Per HPI unless specifically indicated above   Allergies as of 12/22/2016   No Known Allergies     Medication List       Accurate as of 12/22/16 11:03 AM. Always use your most recent med list.          amitriptyline 50 MG tablet Commonly known as:  ELAVIL TAKE ONE TABLET BY MOUTH AT BEDTIME   amLODipine 5 MG tablet Commonly known as:  NORVASC TAKE ONE TABLET BY MOUTH DAILY. (IN THE EVENING)   aspirin 325 MG tablet Take 1 tablet (325 mg total) by mouth daily.   DULoxetine 30 MG capsule Commonly known as:  CYMBALTA Take 1 capsule (30 mg total) by mouth daily.   furosemide 20 MG tablet Commonly known as:  LASIX TAKE ONE TABLET BY MOUTH IN THE MORNING FOR BLOOD PRESSURE AND FLUID (MORNING)   gabapentin 300 MG capsule Commonly known as:  NEURONTIN TAKE ONE CAPSULE BY MOUTH THREE TIMES DAILY (MORNING, NOON, EVENING)   HYDROcodone-acetaminophen 5-325 MG tablet Commonly known as:  NORCO/VICODIN Take 1 tablet by mouth daily as needed for severe pain. Reported on 05/08/2016   HYDROcodone-acetaminophen 5-325 MG tablet Commonly known as:  NORCO/VICODIN Take 1 tablet by mouth daily as needed for moderate pain. Do not refill until 30  days from prescription date   HYDROcodone-acetaminophen 5-325 MG tablet Commonly known as:  NORCO/VICODIN Take 1 tablet by mouth daily as needed for moderate pain. Do not refill until 60 days from prescription   lisinopril 20 MG tablet Commonly known as:  PRINIVIL,ZESTRIL TAKE ONE TABLET BY MOUTH IN THE MORNING FOR BLOOD PRESSURE   metFORMIN 1000 MG tablet Commonly known as:  GLUCOPHAGE Take 1 tablet (1,000 mg total) by mouth 2 (two) times daily with a meal.   metoprolol succinate 100 MG 24 hr tablet Commonly known as:   TOPROL-XL TAKE ONE TABLET BY MOUTH IN THE MORNING   nitroGLYCERIN 0.4 MG SL tablet Commonly known as:  NITROSTAT Place 0.4 mg under the tongue every 5 (five) minutes as needed for chest pain.   potassium chloride 10 MEQ tablet Commonly known as:  K-DUR,KLOR-CON TAKE ONE TABLET BY MOUTH TWICE DAILY (MORNING & IN THE EVENING)   triamcinolone cream 0.1 % Commonly known as:  KENALOG Apply 1 application topically 2 (two) times daily.          Objective:    BP 132/88   Pulse 73   Temp 97.7 F (36.5 C) (Oral)   Ht _0  (1.626 m)   Wt 204 lb (92.5 kg)   BMI 35.02 kg/m   Wt Readings from Last 3 Encounters:  12/22/16 204 lb (92.5 kg)  08/02/16 202 lb 6 oz (91.8 kg)  07/05/16 206 lb 8 oz (93.7 kg)    Physical Exam  Constitutional: He is oriented to person, place, and time. He appears well-developed and well-nourished. No distress.  Eyes: Conjunctivae are normal. No scleral icterus.  Neck: Neck supple. No thyromegaly present.  Cardiovascular: Normal rate, regular rhythm, normal heart sounds and intact distal pulses.   No murmur heard. Pulmonary/Chest: Effort normal and breath sounds normal. No respiratory distress. He has no wheezes.  Musculoskeletal: Normal range of motion. He exhibits tenderness (Tenderness extending down right leg from spine.). He exhibits no edema.  Lymphadenopathy:    He has no cervical adenopathy.  Neurological: He is alert and oriented to person, place, and time. Coordination normal.  Skin: Skin is warm and dry. No rash noted. He is not diaphoretic.  Psychiatric: He has a normal mood and affect. His behavior is normal.  Nursing note and vitals reviewed.   Diabetic Foot Exam - Simple   Simple Foot Form Diabetic Foot exam was performed with the following findings:  Yes 12/22/2016 11:01 AM  Visual Inspection No deformities, no ulcerations, no other skin breakdown bilaterally:  Yes Sensation Testing See comments:  Yes Pulse Check Posterior Tibialis and  Dorsalis pulse intact bilaterally:  Yes Comments Complete lack of sensation in right foot, sensation completely intact on left foot        Assessment & Plan:   Problem List Items Addressed This Visit      Cardiovascular and Mediastinum   Essential hypertension - Primary   Relevant Orders   CMP14+EGFR   Lipid panel     Endocrine   Diabetes ([place])   Relevant Orders   CMP14+EGFR   Bayer DCA Hb A1c Waived   Lipid panel   Microalbumin / creatinine urine ratio    Other Visit Diagnoses    Uncomplicated opioid dependence ([place])       Relevant Medications   HYDROcodone-acetaminophen (NORCO/VICODIN) 5-325 MG tablet   HYDROcodone-acetaminophen (NORCO/VICODIN) 5-325 MG tablet   HYDROcodone-acetaminophen (NORCO/VICODIN) 5-325 MG tablet   Neurogenic pain, leg, right  Relevant Medications   HYDROcodone-acetaminophen (NORCO/VICODIN) 5-325 MG tablet   HYDROcodone-acetaminophen (NORCO/VICODIN) 5-325 MG tablet   HYDROcodone-acetaminophen (NORCO/VICODIN) 5-325 MG tablet   Need for hepatitis C screening test       Relevant Orders   Hepatitis C antibody       Follow up plan: Return if symptoms worsen or fail to improve.  Counseling provided for all of the vaccine components Orders Placed This Encounter  Procedures  . CMP14+EGFR  . Bayer DCA Hb A1c Waived  . Lipid panel  . Microalbumin / creatinine urine ratio  . Hepatitis C antibody    Caryl Pina, MD [facility] 12/22/2016, 11:03 AM

## 2016-12-23 LAB — LIPID PANEL
Chol/HDL Ratio: 5.3 ratio units — ABNORMAL HIGH (ref 0.0–5.0)
Cholesterol, Total: 148 mg/dL (ref 100–199)
HDL: 28 mg/dL — ABNORMAL LOW (ref >39)
LDL Calculated: 72 mg/dL (ref 0–99)
Triglycerides: 240 mg/dL — ABNORMAL HIGH (ref 0–149)
VLDL Cholesterol Cal: 48 mg/dL — ABNORMAL HIGH (ref 5–40)

## 2016-12-23 LAB — CMP14+EGFR
ALT: 54 IU/L — ABNORMAL HIGH (ref 0–44)
AST: 31 IU/L (ref 0–40)
Albumin/Globulin Ratio: 1.8 (ref 1.2–2.2)
Albumin: 4.5 g/dL (ref 3.6–4.8)
Alkaline Phosphatase: 70 IU/L (ref 39–117)
BUN/Creatinine Ratio: 9 — ABNORMAL LOW (ref 10–24)
BUN: 10 mg/dL (ref 8–27)
Bilirubin Total: 1.7 mg/dL — ABNORMAL HIGH (ref 0.0–1.2)
CO2: 26 mmol/L (ref 18–29)
Calcium: 9.4 mg/dL (ref 8.6–10.2)
Chloride: 97 mmol/L (ref 96–106)
Creatinine, Ser: 1.06 mg/dL (ref 0.76–1.27)
GFR calc Af Amer: 86 mL/min/{1.73_m2} (ref >59)
GFR calc non Af Amer: 74 mL/min/{1.73_m2} (ref >59)
Globulin, Total: 2.5 g/dL (ref 1.5–4.5)
Glucose: 303 mg/dL — ABNORMAL HIGH (ref 65–99)
Potassium: 4.4 mmol/L (ref 3.5–5.2)
Sodium: 141 mmol/L (ref 134–144)
Total Protein: 7 g/dL (ref 6.0–8.5)

## 2016-12-23 LAB — HEPATITIS C ANTIBODY: Hep C Virus Ab: <0.1 s/co ratio (ref 0.0–0.9)

## 2016-12-23 LAB — MICROALBUMIN / CREATININE URINE RATIO
Creatinine, Urine: 52.9 mg/dL
Microalb/Creat Ratio: 117.8 mg/g creat — ABNORMAL HIGH (ref 0.0–30.0)
Microalbumin, Urine: 62.3 ug/mL

## 2017-01-01 ENCOUNTER — Ambulatory Visit: Payer: Medicaid Other | Admitting: Family Medicine

## 2017-01-08 ENCOUNTER — Ambulatory Visit: Payer: Medicaid Other | Admitting: Pharmacist

## 2017-01-09 ENCOUNTER — Encounter: Payer: Self-pay | Admitting: Family Medicine

## 2017-01-17 ENCOUNTER — Other Ambulatory Visit: Payer: Self-pay | Admitting: Family Medicine

## 2017-01-17 ENCOUNTER — Encounter: Payer: Self-pay | Admitting: Family Medicine

## 2017-01-17 ENCOUNTER — Ambulatory Visit (INDEPENDENT_AMBULATORY_CARE_PROVIDER_SITE_OTHER): Payer: Medicaid Other | Admitting: Family Medicine

## 2017-01-17 VITALS — BP 186/89 | HR 71 | Temp 97.2°F | Ht 64.0 in | Wt 203.4 lb

## 2017-01-17 DIAGNOSIS — F329 Major depressive disorder, single episode, unspecified: Secondary | ICD-10-CM

## 2017-01-17 DIAGNOSIS — F419 Anxiety disorder, unspecified: Secondary | ICD-10-CM

## 2017-01-17 DIAGNOSIS — N481 Balanitis: Secondary | ICD-10-CM | POA: Diagnosis not present

## 2017-01-17 MED ORDER — MICONAZOLE NITRATE 2 % EX CREA: 1.0000 "application " | TOPICAL_CREAM | Freq: Two times a day (BID) | CUTANEOUS | 0 refills | Status: DC

## 2017-01-17 NOTE — Progress Notes (Signed)
   BP (!) 185/84   Pulse 71   Temp 97.2 F (36.2 C) (Oral)   Ht 5\' 4"  (1.626 m)   Wt 203 lb 6.4 oz (92.3 kg)   BMI 34.91 kg/m    Subjective:    Patient ID: [name] [name], male    DOB: [birth year], 66 y.o.   MRN: [id number]  HPI: MIKHI ATHEY is a 66 y.o. male presenting on 01/17/2017 for Rash (on groin area. x 2 days)   HPI Rash on the head of the penis Patient comes in today because he has a rash on the head of his penis that's been there for at least the past couple days since they noticed it. 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 There is some thicker discharge behind the foreskin when he pulls it back that is associated with it. He denies any itching or burning associated with that either. He denies any fevers or chills or dysuria or hematuria.  Relevant past medical, surgical, family and social history reviewed and updated as indicated. Interim medical history since our last visit reviewed. Allergies and medications reviewed and updated.  Review of Systems  Constitutional: Negative for chills and fever.  Respiratory: Negative for shortness of breath and wheezing.   Cardiovascular: Negative for chest pain and leg swelling.  Musculoskeletal: Negative for back pain and gait problem.  Skin: Positive for color change and rash.  All other systems reviewed and are negative.   Per HPI unless specifically indicated above        Objective:    BP (!) 185/84   Pulse 71   Temp 97.2 F (36.2 C) (Oral)   Ht 5\' 4"  (1.626 m)   Wt 203 lb 6.4 oz (92.3 kg)   BMI 34.91 kg/m   Wt Readings from Last 3 Encounters:  01/17/17 203 lb 6.4 oz (92.3 kg)  12/22/16 204 lb (92.5 kg)  08/02/16 202 lb 6 oz (91.8 kg)    Physical Exam  Constitutional: He is oriented to person, place, and time. He appears well-developed and well-nourished. No distress.  Eyes: Conjunctivae are normal. No scleral icterus.    Musculoskeletal: Normal range of motion. He exhibits no edema.  Neurological: He is alert and oriented to person, place, and time. Coordination normal.  Skin: Skin is warm and dry. Rash (Pink plaque, that appears irritated on both sides of the head of the penis with a thicker discharge behind the foreskin when retracted.) noted. He is not diaphoretic.  Psychiatric: He has a normal mood and affect. His behavior is normal.  Nursing note and vitals reviewed.     Assessment & Plan:   Problem List Items Addressed This Visit    None    Visit Diagnoses    Balanitis    -  Primary   Relevant Medications   miconazole (MICATIN) 2 % cream       Follow up plan: Return if symptoms worsen or fail to improve.  Counseling provided for all of the vaccine components No orders of the defined types were placed in this encounter.   Caryl Pina, MD [facility] 01/17/2017, 4:12 PM

## 2017-01-18 ENCOUNTER — Ambulatory Visit: Payer: Medicaid Other | Admitting: Family Medicine

## 2017-01-30 ENCOUNTER — Encounter: Payer: Self-pay | Admitting: Pharmacist

## 2017-01-30 ENCOUNTER — Ambulatory Visit (INDEPENDENT_AMBULATORY_CARE_PROVIDER_SITE_OTHER): Payer: Medicaid Other | Admitting: Pharmacist

## 2017-01-30 VITALS — BP 150/99 | HR 78 | Ht 64.0 in | Wt 205.0 lb

## 2017-01-30 DIAGNOSIS — E1149 Type 2 diabetes mellitus with other diabetic neurological complication: Secondary | ICD-10-CM | POA: Diagnosis not present

## 2017-01-30 MED ORDER — EXENATIDE ER 2 MG/0.85ML ~~LOC~~ AUIJ
2.0000 mg | AUTO-INJECTOR | SUBCUTANEOUS | 2 refills | Status: DC
Start: 2017-01-30 — End: 2017-02-01

## 2017-01-30 NOTE — Patient Instructions (Signed)
Diabetes and Standards of Medical Care   Diabetes is complicated. You may find that your diabetes team includes a dietitian, nurse, diabetes educator, eye doctor, and more. To help everyone know what is going on and to help you get the care you deserve, the following schedule of care was developed to help keep you on track. Below are the tests, exams, vaccines, medicines, education, and plans you will need.  Blood Glucose Goals Prior to meals = 80 - 130 Within 2 hours of the start of a meal = less than 180  HbA1c test (goal is less than 7.0% - your last value was 10.5%) This test shows how well you have controlled your glucose over the past 2 to 3 months. It is used to see if your diabetes management plan needs to be adjusted.   It is performed at least 2 times a year if you are meeting treatment goals.  It is performed 4 times a year if therapy has changed or if you are not meeting treatment goals.  Blood pressure test  This test is performed at every routine medical visit. The goal is less than 140/90 mmHg for most people, but 130/80 mmHg in some cases. Ask your health care provider about your goal.  Dental exam  Follow up with the dentist regularly.  Eye exam  If you are diagnosed with type 1 diabetes as a child, get an exam upon reaching the age of 10 years or older and have had diabetes for 3 to 5 years. Yearly eye exams are recommended after that initial eye exam.  If you are diagnosed with type 1 diabetes as an adult, get an exam within 5 years of diagnosis and then yearly.  If you are diagnosed with type 2 diabetes, get an exam as soon as possible after the diagnosis and then yearly.  Foot care exam  Visual foot exams are performed at every routine medical visit. The exams check for cuts, injuries, or other problems with the feet.  A comprehensive foot exam should be done yearly. This includes visual inspection as well as assessing foot pulses and testing for loss of  sensation.  Check your feet nightly for cuts, injuries, or other problems with your feet. Tell your health care provider if anything is not healing.  Kidney function test (urine microalbumin)  This test is performed once a year.  Type 1 diabetes: The first test is performed 5 years after diagnosis.  Type 2 diabetes: The first test is performed at the time of diagnosis.  A serum creatinine and estimated glomerular filtration rate (eGFR) test is done once a year to assess the level of chronic kidney disease (CKD), if present.  Lipid profile (cholesterol, HDL, LDL, triglycerides)  Performed every 5 years for most people.  The goal for LDL is less than 100 mg/dL. If you are at high risk, the goal is less than 70 mg/dL.  The goal for HDL is 40 mg/dL to 50 mg/dL for men and 50 mg/dL to 60 mg/dL for women. An HDL cholesterol of 60 mg/dL or higher gives some protection against heart disease.  The goal for triglycerides is less than 150 mg/dL.  Influenza vaccine, pneumococcal vaccine, and hepatitis B vaccine  The influenza vaccine is recommended yearly.  The pneumococcal vaccine is generally given once in a lifetime. However, there are some instances when another vaccination is recommended. Check with your health care provider.  The hepatitis B vaccine is also recommended for adults with diabetes.    Diabetes self-management education  Education is recommended at diagnosis and ongoing as needed.  Treatment plan  Your treatment plan is reviewed at every medical visit.

## 2017-01-30 NOTE — Progress Notes (Signed)
Patient ID: [name] [name], male   DOB: 07/06/1951, 66 y.o.   MRN: [id number]   Subjective:    [name] [name] is a 66 y.o. male who presents for an initial evaluation of Type 2 diabetes mellitus.  Patient states that his past PCP, Dr Scotty Court told him he had diabetes but he has just been in disbelief that he has diabetes like his parents.  He has not been following a low CHO / CHO counting diet.   He is currently taking metformin 1000mg  bid for DM but states that he hates taking pills.    Current symptoms/problems include hyperglycemia and paresthesia of the feet and have been worsening.   Current monitoring regimen: home blood tests - checks BG once a day Home blood sugar records: 146, 200, 140, 230, 480 Any episodes of hypoglycemia? no  Weight trend: stable Prior visit with CDE: no Current diet: in general, an "unhealthy" diet Current exercise: none Medication Compliance?  Yes - patient's meds are packaged by his pharmacy [facility]  Is He on ACE inhibitor or angiotensin II receptor blocker?  Yes  lisinopril (Prinivil)  Current Outpatient Prescriptions on File Prior to Visit  Medication Sig Dispense Refill  . amitriptyline (ELAVIL) 50 MG tablet TAKE ONE TABLET BY MOUTH AT BEDTIME 30 tablet 2  . amLODipine (NORVASC) 5 MG tablet TAKE ONE TABLET BY MOUTH DAILY. (IN THE EVENING) 30 tablet 1  . aspirin 325 MG tablet Take 1 tablet (325 mg total) by mouth daily.    . DULoxetine (CYMBALTA) 30 MG capsule Take 1 capsule (30 mg total) by mouth daily. 90 capsule 1  . furosemide (LASIX) 20 MG tablet TAKE ONE TABLET BY MOUTH IN THE MORNING FOR BLOOD PRESSURE AND FLUID (MORNING) 30 tablet 5  . HYDROcodone-acetaminophen (NORCO/VICODIN) 5-325 MG tablet Take 1 tablet by mouth daily as needed for severe pain. Reported on 05/08/2016 30 tablet 0  . HYDROcodone-acetaminophen (NORCO/VICODIN) 5-325 MG tablet Take 1 tablet by mouth daily as needed for moderate pain. Do not refill until 30 days from  prescription date 30 tablet 0  . HYDROcodone-acetaminophen (NORCO/VICODIN) 5-325 MG tablet Take 1 tablet by mouth daily as needed for moderate pain. Do not refill until 60 days from prescription 30 tablet 0  . lisinopril (PRINIVIL,ZESTRIL) 20 MG tablet TAKE ONE TABLET BY MOUTH IN THE MORNING FOR BLOOD PRESSURE 30 tablet 5  . metFORMIN (GLUCOPHAGE) 1000 MG tablet Take 1 tablet (1,000 mg total) by mouth 2 (two) times daily with a meal. 180 tablet 3  . metoprolol succinate (TOPROL-XL) 100 MG 24 hr tablet TAKE ONE TABLET BY MOUTH IN THE MORNING 30 tablet 5  . miconazole (MICATIN) 2 % cream Apply 1 application topically 2 (two) times daily. 7 days 28.35 g 0  . nitroGLYCERIN (NITROSTAT) 0.4 MG SL tablet Place 0.4 mg under the tongue every 5 (five) minutes as needed for chest pain.    . potassium chloride (K-DUR,KLOR-CON) 10 MEQ tablet TAKE ONE TABLET BY MOUTH TWICE DAILY (MORNING ,EVENING) 60 tablet 5  . triamcinolone cream (KENALOG) 0.1 % Apply 1 application topically 2 (two) times daily.     No current facility-administered medications on file prior to visit.       The following portions of the patient's history were reviewed and updated as appropriate: allergies, current medications, past family history, past medical history, past social history, past surgical history and problem list.    Objective:    There were no vitals taken for this visit.  Lab Review Glucose (mg/dL)  Date Value  12/22/2016 303 (H)  08/02/2016 200 (H)  05/08/2016 195 (H)   Glucose, Bld (mg/dL)  Date Value  05/04/2015 164 (H)  05/03/2015 309 (H)  05/03/2015 308 (H)   CO2 (mmol/L)  Date Value  12/22/2016 26  08/02/2016 26  05/08/2016 24   BUN (mg/dL)  Date Value  12/22/2016 10  08/02/2016 11  05/08/2016 14   Creatinine, Ser (mg/dL)  Date Value  12/22/2016 1.06  08/02/2016 1.15  05/08/2016 1.05   last A1c = 10.5% (12/22/2016)    Assessment:    Diabetes Mellitus type II, under inadequate control.     Plan:    1.  Rx changes: start bydureon 2mg  sq weekly - first dose was given in office today.     Patient warned of possible side effects, especially nausea  Continue metformin 1000mg  bid 2.  Education: Reviewed 'ABCs' of diabetes management (respective goals in parentheses):  A1C (<7), blood pressure (<130/80), and cholesterol (LDL <100). 3. Discussed pathophysiology of DM; difference between type 1 and type 2 DM. 4. CHO counting diet discussed.  Reviewed CHO amount in various foods and how to read nutrition labels.  Discussed recommended serving sizes.  5.  Recommend check BG 2  times a day 6.  Recommended increase physical activity - goal is 150 minutes per week 7. Follow up: 1 month  with me and in June with PCP

## 2017-01-31 ENCOUNTER — Telehealth: Payer: Self-pay | Admitting: Family Medicine

## 2017-01-31 NOTE — Telephone Encounter (Signed)
First dose of Byrudreon was given in office yesterday.  He will inject one syringe every Tuesday.  Next dose due Tuesday 04/17/218.

## 2017-01-31 NOTE — Telephone Encounter (Signed)
Per pt Bydureon needs PA Informed pt we will try to obtain PA Verbalizes understanding

## 2017-02-01 ENCOUNTER — Telehealth: Payer: Self-pay

## 2017-02-01 MED ORDER — EXENATIDE ER 2 MG ~~LOC~~ PEN: 2.0000 mg | PEN_INJECTOR | SUBCUTANEOUS | 1 refills | Status: DC

## 2017-02-01 NOTE — Telephone Encounter (Signed)
rx switched to regular Bydureon instead of Bcise pen.  Asked pharmacy to show patient how to mix and inject. Patient to call if any questions.

## 2017-02-14 ENCOUNTER — Other Ambulatory Visit: Payer: Self-pay | Admitting: Family Medicine

## 2017-02-14 DIAGNOSIS — I10 Essential (primary) hypertension: Secondary | ICD-10-CM

## 2017-03-01 ENCOUNTER — Ambulatory Visit: Payer: Self-pay | Admitting: Pharmacist

## 2017-03-05 ENCOUNTER — Encounter: Payer: Self-pay | Admitting: Family Medicine

## 2017-03-06 ENCOUNTER — Other Ambulatory Visit: Payer: Self-pay | Admitting: Pharmacist

## 2017-03-12 ENCOUNTER — Ambulatory Visit (INDEPENDENT_AMBULATORY_CARE_PROVIDER_SITE_OTHER): Payer: Medicaid Other | Admitting: Pharmacist

## 2017-03-12 ENCOUNTER — Encounter: Payer: Self-pay | Admitting: Pharmacist

## 2017-03-12 VITALS — BP 148/94 | HR 78 | Ht 64.0 in | Wt 207.0 lb

## 2017-03-12 DIAGNOSIS — E1149 Type 2 diabetes mellitus with other diabetic neurological complication: Secondary | ICD-10-CM | POA: Diagnosis not present

## 2017-03-12 NOTE — Progress Notes (Signed)
Patient ID: [name] [name], male   DOB: 1951/09/02, 66 y.o.   MRN: [id number]    Subjective:    [name] [name] is a 66 y.o. male who presents for follow up of Type 2 diabetes mellitus.  He was here about 1 month ago and Bydureon was started (initially started the [facility] device but this was not covered by Medicaid and was switched to older Bydureon device)  He is tolerating well and states that he can tell it is helping because he is less hungry. He is also taking metformin 1000mg  bid for DM.   Current monitoring regimen: home blood tests - checks BG once or twice a day Home blood sugar records: 130, 144, 200, 124 Any episodes of hypoglycemia? no  Weight trend: increased by 2# Prior visit with CDE: Yes - 1 visit, last month was first visit Current diet: although patient reports decreased appetite he also mentions that he we to [employer] cream this weekend and had a banana split with 3 scoops of icecream.  He also mentions going to steakhouse and having steak, baked potato, salad and 1-2 large rolls. Current exercise: none Medication Compliance?  Yes - patient's meds are packaged by his pharmacy [facility]  Is He on ACE inhibitor or angiotensin II receptor blocker?  Yes  lisinopril (Prinivil)  Current Outpatient Prescriptions on File Prior to Visit  Medication Sig Dispense Refill  . amitriptyline (ELAVIL) 50 MG tablet TAKE ONE TABLET BY MOUTH AT BEDTIME 30 tablet 2  . amLODipine (NORVASC) 5 MG tablet TAKE ONE TABLET BY MOUTH DAILY. (,EVENING) 30 tablet 5  . aspirin 325 MG tablet Take 1 tablet (325 mg total) by mouth daily.    Marland Kitchen BYDUREON 2 MG PEN INJECT TWO MG SUBCUTANEOUSLY ONCE A WEEK 4 each 1  . DULoxetine (CYMBALTA) 30 MG capsule Take 1 capsule (30 mg total) by mouth daily. 90 capsule 1  . furosemide (LASIX) 20 MG tablet TAKE ONE TABLET BY MOUTH IN THE MORNING FOR BLOOD PRESSURE AND FLUID (MORNING) 30 tablet 5  . HYDROcodone-acetaminophen (NORCO/VICODIN) 5-325 MG tablet Take 1  tablet by mouth daily as needed for severe pain. Reported on 05/08/2016 30 tablet 0  . HYDROcodone-acetaminophen (NORCO/VICODIN) 5-325 MG tablet Take 1 tablet by mouth daily as needed for moderate pain. Do not refill until 30 days from prescription date 30 tablet 0  . HYDROcodone-acetaminophen (NORCO/VICODIN) 5-325 MG tablet Take 1 tablet by mouth daily as needed for moderate pain. Do not refill until 60 days from prescription 30 tablet 0  . lisinopril (PRINIVIL,ZESTRIL) 20 MG tablet TAKE ONE TABLET BY MOUTH IN THE MORNING FOR BLOOD PRESSURE 30 tablet 5  . metFORMIN (GLUCOPHAGE) 1000 MG tablet Take 1 tablet (1,000 mg total) by mouth 2 (two) times daily with a meal. 180 tablet 3  . metoprolol succinate (TOPROL-XL) 100 MG 24 hr tablet TAKE ONE TABLET BY MOUTH IN THE MORNING 30 tablet 5  . miconazole (MICATIN) 2 % cream Apply 1 application topically 2 (two) times daily. 7 days 28.35 g 0  . nitroGLYCERIN (NITROSTAT) 0.4 MG SL tablet Place 0.4 mg under the tongue every 5 (five) minutes as needed for chest pain.    . potassium chloride (K-DUR,KLOR-CON) 10 MEQ tablet TAKE ONE TABLET BY MOUTH TWICE DAILY (MORNING ,EVENING) 60 tablet 5  . RESTASIS MULTIDOSE 0.05 % ophthalmic emulsion Place 1 drop into both eyes daily.  3  . triamcinolone cream (KENALOG) 0.1 % Apply 1 application topically 2 (two) times daily.  No current facility-administered medications on file prior to visit.       Objective:    BP (!) 148/94   Pulse 78   Ht 5\' 4"  (1.626 m)   Wt 207 lb (93.9 kg)   BMI 35.53 kg/m    Last A1c = 10.5% (12/22/2016)  RBG in office was 181mg /dL   Creatinine, Ser (mg/dL)  Date Value  12/22/2016 1.06  08/02/2016 1.15  05/08/2016 1.05     Assessment:    Diabetes Mellitus type 2, not currently taking insulin - under inadequate but improving control   Plan:    1.  Rx changes:   Continue Budureon 2mg  SQ weekly   Continue metformin 1000mg  bid with food 2.  Education: Reviewed 'ABCs' of  diabetes management (respective goals in parentheses):  A1C (<7), blood pressure (<130/80), and cholesterol (LDL <100). 3. Spent at least 30 minutes discussing CHO counting diet.  Discussed recommended serving sizes.  Patient is to limit serving sizes of sugar containing foods.  5.  Recommend check BG 2  times a day 6.  Increase movement / activity - try to walk daily - 10 minutes once or twice a day to start 7. Will see PCP in June - follow up with me in July or August.

## 2017-03-12 NOTE — Patient Instructions (Signed)
Try to have no more than 3 of these foods listed below per meal and keep serving sizes small - 1/2 cup serving sizes  Fruit:   1/2 cup or once piece (baseball size)- apples, pears, pineapple, peaches, oranges  1 cup - berries, melons  1/2 banana or grapefruit  Stachy Vegetables:   1/2 cup potatoes (white or sweet), corn, peas, beans  Other starches:   1 piece of bread  1/2 cup rice or pasta  4 inch pancake or waffle  3/4 cup dry cereal   These foods you can eat more freely: Proteins:   Fish  Chicken or [place]  Beef or pork (1 or 2 servings per week)  Eggs  Nuts (peanuts, walnuts, almonds, pistachios)  Cheese  Non starchy vegetables:  Green beans  Broccoli or cauliflower  Lettuce, greens, cabbage  Brussel Sprout  Carrots  Onions and peppers  Celery  Tomatoes  Asparagus  Eggplant  Cucumbers  Squash and Zucchini  Try to limit sweets and avoid beverages with sugar (sweet tea or soda)  1/2 cup ice cream  2 inch square of cake        Diabetes and Standards of Medical Care   Diabetes is complicated. You may find that your diabetes team includes a dietitian, nurse, diabetes educator, eye doctor, and more. To help everyone know what is going on and to help you get the care you deserve, the following schedule of care was developed to help keep you on track. Below are the tests, exams, vaccines, medicines, education, and plans you will need.  Blood Glucose Goals Prior to meals = 80 - 130 Within 2 hours of the start of a meal = less than 180  HbA1c test (goal is less than 7.0% - your last value was 10.5%) This test shows how well you have controlled your glucose over the past 2 to 3 months. It is used to see if your diabetes management plan needs to be adjusted.   It is performed at least 2 times a year if you are meeting treatment goals.  It is performed 4 times a year if therapy has changed or if you are not meeting treatment goals.  Blood pressure test  This  test is performed at every routine medical visit. The goal is less than 140/90 mmHg for most people, but 130/80 mmHg in some cases. Ask your health care provider about your goal.  Dental exam  Follow up with the dentist regularly.  Eye exam  If you are diagnosed with type 1 diabetes as a child, get an exam upon reaching the age of 64 years or older and have had diabetes for 3 to 5 years. Yearly eye exams are recommended after that initial eye exam.  If you are diagnosed with type 1 diabetes as an adult, get an exam within 5 years of diagnosis and then yearly.  If you are diagnosed with type 2 diabetes, get an exam as soon as possible after the diagnosis and then yearly.  Foot care exam  Visual foot exams are performed at every routine medical visit. The exams check for cuts, injuries, or other problems with the feet.  A comprehensive foot exam should be done yearly. This includes visual inspection as well as assessing foot pulses and testing for loss of sensation.  Check your feet nightly for cuts, injuries, or other problems with your feet. Tell your health care provider if anything is not healing.  Kidney function test (urine microalbumin)  This test  is performed once a year.  Type 1 diabetes: The first test is performed 5 years after diagnosis.  Type 2 diabetes: The first test is performed at the time of diagnosis.  A serum creatinine and estimated glomerular filtration rate (eGFR) test is done once a year to assess the level of chronic kidney disease (CKD), if present.  Lipid profile (cholesterol, HDL, LDL, triglycerides)  Performed every 5 years for most people.  The goal for LDL is less than 100 mg/dL. If you are at high risk, the goal is less than 70 mg/dL.  The goal for HDL is 40 mg/dL to 50 mg/dL for men and 50 mg/dL to 60 mg/dL for women. An HDL cholesterol of 60 mg/dL or higher gives some protection against heart disease.  The goal for triglycerides is less than 150  mg/dL.  Influenza vaccine, pneumococcal vaccine, and hepatitis B vaccine  The influenza vaccine is recommended yearly.  The pneumococcal vaccine is generally given once in a lifetime. However, there are some instances when another vaccination is recommended. Check with your health care provider.  The hepatitis B vaccine is also recommended for adults with diabetes.  Diabetes self-management education  Education is recommended at diagnosis and ongoing as needed.  Treatment plan  Your treatment plan is reviewed at every medical visit.  Document Released: 08/06/2009 Document Revised: 06/11/2013 Document Reviewed: 03/11/2013 [facility] Patient Information 2014 [place].

## 2017-03-13 ENCOUNTER — Other Ambulatory Visit: Payer: Self-pay | Admitting: Family Medicine

## 2017-04-02 ENCOUNTER — Ambulatory Visit: Payer: Medicaid Other | Admitting: Family Medicine

## 2017-04-04 ENCOUNTER — Encounter: Payer: Self-pay | Admitting: Family Medicine

## 2017-04-05 ENCOUNTER — Ambulatory Visit: Payer: Medicaid Other | Admitting: Family Medicine

## 2017-05-04 ENCOUNTER — Other Ambulatory Visit: Payer: Self-pay | Admitting: Family Medicine

## 2017-05-04 DIAGNOSIS — F419 Anxiety disorder, unspecified: Secondary | ICD-10-CM

## 2017-05-04 DIAGNOSIS — F329 Major depressive disorder, single episode, unspecified: Secondary | ICD-10-CM

## 2017-05-07 ENCOUNTER — Ambulatory Visit: Payer: Self-pay | Admitting: Pharmacist

## 2018-05-24 ENCOUNTER — Emergency Department (HOSPITAL_COMMUNITY)
Admission: EM | Admit: 2018-05-24 | Discharge: 2018-05-24 | Disposition: A | Discharge status: Home / Self Care | Payer: Medicare HMO | Attending: Emergency Medicine | Admitting: Emergency Medicine

## 2018-05-24 ENCOUNTER — Emergency Department (HOSPITAL_COMMUNITY): Payer: Medicare HMO

## 2018-05-24 ENCOUNTER — Encounter (HOSPITAL_COMMUNITY): Payer: Self-pay

## 2018-05-24 DIAGNOSIS — I10 Essential (primary) hypertension: Secondary | ICD-10-CM | POA: Diagnosis not present

## 2018-05-24 DIAGNOSIS — R079 Chest pain, unspecified: Secondary | ICD-10-CM

## 2018-05-24 DIAGNOSIS — E119 Type 2 diabetes mellitus without complications: Secondary | ICD-10-CM | POA: Insufficient documentation

## 2018-05-24 DIAGNOSIS — Z7982 Long term (current) use of aspirin: Secondary | ICD-10-CM | POA: Insufficient documentation

## 2018-05-24 DIAGNOSIS — Z7984 Long term (current) use of oral hypoglycemic drugs: Secondary | ICD-10-CM | POA: Insufficient documentation

## 2018-05-24 DIAGNOSIS — Z79899 Other long term (current) drug therapy: Secondary | ICD-10-CM | POA: Diagnosis not present

## 2018-05-24 LAB — CBC
HCT: 43.3 % (ref 39.0–52.0)
Hemoglobin: 15.4 g/dL (ref 13.0–17.0)
MCH: 29.7 pg (ref 26.0–34.0)
MCHC: 35.6 g/dL (ref 30.0–36.0)
MCV: 83.4 fL (ref 78.0–100.0)
Platelets: 108 10*3/uL — ABNORMAL LOW (ref 150–400)
RBC: 5.19 MIL/uL (ref 4.22–5.81)
RDW: 13.8 % (ref 11.5–15.5)
WBC: 5.8 10*3/uL (ref 4.0–10.5)

## 2018-05-24 LAB — BASIC METABOLIC PANEL
Anion gap: 10 (ref 5–15)
BUN: 13 mg/dL (ref 8–23)
CO2: 24 mmol/L (ref 22–32)
Calcium: 8.1 mg/dL — ABNORMAL LOW (ref 8.9–10.3)
Chloride: 99 mmol/L (ref 98–111)
Creatinine, Ser: 1.18 mg/dL (ref 0.61–1.24)
GFR calc Af Amer: >60 mL/min (ref >60)
GFR calc non Af Amer: >60 mL/min (ref >60)
Glucose, Bld: 375 mg/dL — ABNORMAL HIGH (ref 70–99)
Potassium: 3.3 mmol/L — ABNORMAL LOW (ref 3.5–5.1)
Sodium: 133 mmol/L — ABNORMAL LOW (ref 135–145)

## 2018-05-24 LAB — TROPONIN I
Troponin I: <0.03 ng/mL (ref <0.03)
Troponin I: <0.03 ng/mL (ref <0.03)

## 2018-05-24 MED ORDER — HYDROMORPHONE HCL 1 MG/ML IJ SOLN
0.5000 mg | Freq: Once | INTRAMUSCULAR | Status: AC
  Administered 2018-05-24: 0.5 mg via INTRAVENOUS
  Filled 2018-05-24: qty 1

## 2018-05-24 MED ORDER — SODIUM CHLORIDE 0.9 % IV BOLUS
1000.0000 mL | Freq: Once | INTRAVENOUS | Status: AC
  Administered 2018-05-24: 1000 mL via INTRAVENOUS

## 2018-05-24 MED ORDER — KETOROLAC TROMETHAMINE 30 MG/ML IJ SOLN
15.0000 mg | Freq: Once | INTRAMUSCULAR | Status: AC
  Administered 2018-05-24: 15 mg via INTRAVENOUS
  Filled 2018-05-24: qty 1

## 2018-05-24 NOTE — ED Notes (Signed)
EMS called to transport patient home

## 2018-05-24 NOTE — ED Notes (Signed)
Gave patient meal tray. Patient states he does not know how he is going to get home. [name] states he will check into getting patient a ride home.

## 2018-05-24 NOTE — ED Triage Notes (Signed)
Pt arrived via EMS from [facility] urgent care. Pt seen there to be seen N/V/D x 2 weeks. While there pt started complaining of chest pressure, dizziness and SOB. BP 184/95 and CBG 404. Chest pain reproducible with palpation

## 2018-05-24 NOTE — ED Notes (Signed)
Called EMS for transport back home as requested by Pts nurse.  They will send a truck asap.

## 2018-05-24 NOTE — ED Provider Notes (Signed)
[facility] EMERGENCY DEPARTMENT Provider Note   CSN: [id number] Arrival date & time: 05/24/18  1303     History   Chief Complaint Chief Complaint  Patient presents with  . Chest Pain    HPI [name] [name] is a 67 y.o. male.  HPI   64yM with CP. Onset yesterday. Sharp pain in center/L chest. Cant remember what was doing at onset. Constant. Worse with some movements. Feels dizzy. No cough. No fever or chills No unusual leg pain or swelling. Also c/o n/v/d for 2w. No significant abdominal pain.   Past Medical History:  Diagnosis Date  . Anxiety   . 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 Borderline diabetes mellitus   . Depression with anxiety   . Diabetes mellitus without complication ([place])   . GERD (gastroesophageal reflux disease)   . Hypertension   . Obstructive sleep apnea on CPAP   . Stroke [facility])     Patient Active Problem List   Diagnosis Date Noted  . Anxiety and depression 06/07/2016  . Bilateral leg weakness   . Generalized weakness 05/03/2015  . Acute encephalopathy 05/03/2015  . Aphasia   . Monocular diplopia 12/02/2014  . Abnormality of gait 12/02/2014  . TIA (transient ischemic attack) 12/01/2014  . Essential hypertension 03/15/2010  . GERD 03/15/2010  . Diabetes ([place]) 03/15/2010    Past Surgical History:  Procedure Laterality Date  . RECTAL SURGERY     As an infant  . TONSILLECTOMY    . UMBILICAL HERNIA REPAIR          Home Medications    Prior to Admission medications   Medication Sig Start Date End Date Taking? Authorizing Provider  amitriptyline (ELAVIL) 50 MG tablet TAKE ONE TABLET BY MOUTH AT BEDTIME 01/18/17   Dettinger, Fransisca Kaufmann, MD  amLODipine (NORVASC) 5 MG tablet TAKE ONE TABLET BY MOUTH DAILY. (,EVENING) 02/14/17   Dettinger, Fransisca Kaufmann, MD  aspirin 325 MG tablet Take 1 tablet (325 mg total) by mouth daily. 12/03/14   Isaac Bliss, Rayford Halsted, MD  BYDUREON 2 MG PEN INJECT TWO MG SUBCUTANEOUSLY ONCE A WEEK 03/06/17    Dettinger, Fransisca Kaufmann, MD  DULoxetine (CYMBALTA) 30 MG capsule Take 1 capsule (30 mg total) by mouth daily. 12/22/16   Dettinger, Fransisca Kaufmann, MD  furosemide (LASIX) 20 MG tablet TAKE ONE TABLET BY MOUTH IN THE MORNING FOR BLOOD PRESSURE AND FLUID (MORNING) 01/18/17   Dettinger, Fransisca Kaufmann, MD  HYDROcodone-acetaminophen (NORCO/VICODIN) 5-325 MG tablet Take 1 tablet by mouth daily as needed for severe pain. Reported on 05/08/2016 12/22/16   Dettinger, Fransisca Kaufmann, MD  HYDROcodone-acetaminophen (NORCO/VICODIN) 5-325 MG tablet Take 1 tablet by mouth daily as needed for moderate pain. Do not refill until 30 days from prescription date 12/22/16   Dettinger, Fransisca Kaufmann, MD  HYDROcodone-acetaminophen (NORCO/VICODIN) 5-325 MG tablet Take 1 tablet by mouth daily as needed for moderate pain. Do not refill until 60 days from prescription 12/22/16   Dettinger, Fransisca Kaufmann, MD  lisinopril (PRINIVIL,ZESTRIL) 20 MG tablet TAKE ONE TABLET BY MOUTH IN THE MORNING FOR BLOOD PRESSURE 01/18/17   Dettinger, Fransisca Kaufmann, MD  metFORMIN (GLUCOPHAGE) 1000 MG tablet TAKE ONE TABLET BY MOUTH TWICE DAILY WITH A MEAL. (MORNING ,EVENING) 03/14/17   Dettinger, Fransisca Kaufmann, MD  metoprolol succinate (TOPROL-XL) 100 MG 24 hr tablet TAKE ONE TABLET BY MOUTH IN THE MORNING 01/18/17   Dettinger, Fransisca Kaufmann, MD  miconazole (MICATIN) 2 % cream Apply 1  application topically 2 (two) times daily. 7 days 01/17/17   Dettinger, Fransisca Kaufmann, MD  nitroGLYCERIN (NITROSTAT) 0.4 MG SL tablet Place 0.4 mg under the tongue every 5 (five) minutes as needed for chest pain.    [provider]  potassium chloride (K-DUR,KLOR-CON) 10 MEQ tablet TAKE ONE TABLET BY MOUTH TWICE DAILY (MORNING ,EVENING) 01/18/17   Dettinger, Fransisca Kaufmann, MD  RESTASIS MULTIDOSE 0.05 % ophthalmic emulsion Place 1 drop into both eyes daily. 11/16/16   [provider]  triamcinolone cream (KENALOG) 0.1 % Apply 1 application topically 2 (two) times daily.    [provider]    Family History Family  History  Problem Relation Age of Onset  . Sudden death Sister   . Dementia Other   . Alzheimer's disease Other   . Diabetes Other   . Coronary artery disease Other   . Heart attack Other   . Cancer Other   . Lung cancer Other   . Cancer Mother        lung  . Diabetes Mother   . Cancer Father   . Diabetes Father   . Colon cancer Neg Hx     Social History Social History   Tobacco Use  . Smoking status: Never Smoker  . Smokeless tobacco: Never Used  Substance Use Topics  . Alcohol use: Yes    Comment: rarely  . Drug use: No     Allergies   Patient has no known allergies.   Review of Systems Review of Systems  All systems reviewed and negative, other than as noted in HPI.  Physical Exam Updated Vital Signs BP (!) 177/91 (BP Location: Right Arm)   Pulse 87   Temp 99.6 F (37.6 C) (Oral)   Resp 20   Ht 5\' 1"  (1.549 m)   Wt 93.9 kg (207 lb)   SpO2 96%   BMI 39.11 kg/m   Physical Exam  Constitutional: He appears well-developed and well-nourished. No distress.  HENT:  Head: Normocephalic and atraumatic.  Eyes: Conjunctivae are normal. Right eye exhibits no discharge. Left eye exhibits no discharge.  Neck: Neck supple.  Cardiovascular: Normal rate, regular rhythm and normal heart sounds. Exam reveals no gallop and no friction rub.  No murmur heard. Pulmonary/Chest: Effort normal and breath sounds normal. No respiratory distress. He exhibits no tenderness.  Abdominal: Soft. He exhibits no distension. There is no tenderness.  Musculoskeletal: He exhibits no edema or tenderness.  Lower extremities symmetric as compared to each other. No calf tenderness. Negative Homan's. No palpable cords.   Neurological: He is alert.  Skin: Skin is warm and dry.  Psychiatric: He has a normal mood and affect. His behavior is normal. Thought content normal.  Nursing note and vitals reviewed.    ED Treatments / Results  Labs (all labs ordered are listed, but only abnormal  results are displayed) Labs Reviewed  BASIC METABOLIC PANEL - Abnormal; Notable for the following components:      Result Value   Sodium 133 (*)    Potassium 3.3 (*)    Glucose, Bld 375 (*)    Calcium 8.1 (*)    All other components within normal limits  CBC - Abnormal; Notable for the following components:   Platelets 108 (*)    All other components within normal limits  TROPONIN I  TROPONIN I    EKG EKG Interpretation  Date/Time:  Friday May 24 2018 13:20:27 EDT Ventricular Rate:  88 PR Interval:  140 QRS Duration:  80 QT Interval:  354 QTC Calculation: 428 R Axis:   26 Text Interpretation:  Normal sinus rhythm Non-specific ST-t changes Abnormal ECG Confirmed by Virgel Manifold [phone]) on 05/24/2018 2:30:30 PM   Radiology Dg Chest 2 View  Result Date: 05/24/2018 CLINICAL DATA:  Short of breath with chest pain EXAM: CHEST - 2 VIEW COMPARISON:  12/01/2014 FINDINGS: Chronic pleural thickening bilaterally. No acute airspace disease. Stable cardiomediastinal silhouette. No pneumothorax. IMPRESSION: No active cardiopulmonary disease. Stable pleural thickening bilaterally. Electronically Signed   By: Donavan Foil M.D.   On: 05/24/2018 14:08    Procedures Procedures (including critical care time)  Medications Ordered in ED Medications  sodium chloride 0.9 % bolus 1,000 mL (has no administration in time range)  ketorolac (TORADOL) 30 MG/ML injection 15 mg (15 mg Intravenous Given 05/24/18 1528)  HYDROmorphone (DILAUDID) injection 0.5 mg (0.5 mg Intravenous Given 05/24/18 1528)     Initial Impression / Assessment and Plan / ED Course  I have reviewed the triage vital signs and the nursing notes.  Pertinent labs & imaging results that were available during my care of the patient were reviewed by me and considered in my medical decision making (see chart for details).     67yM with CP. Seems atypical for ACS. Doubt PE, dissection or other emergent process. N/v/d for 2w but no  abdominal pain. Abdominal exam benign. Drinking diet coke in the ED w/o apparent problems.   It has been determined that no acute conditions requiring further emergency intervention are present at this time. The patient has been advised of the diagnosis and plan. I reviewed any labs and imaging including any potential incidental findings. We have discussed signs and symptoms that warrant return to the ED and they are listed in the discharge instructions.    Final Clinical Impressions(s) / ED Diagnoses   Final diagnoses:  Chest pain, unspecified type    ED Discharge Orders    None       Virgel Manifold, MD 05/28/18 [phone]

## 2018-05-24 NOTE — Discharge Instructions (Addendum)
Follow-up with your PCP to discuss cardiac stress testing.

## 2020-07-11 ENCOUNTER — Emergency Department (HOSPITAL_COMMUNITY)
Admission: EM | Admit: 2020-07-11 | Discharge: 2020-07-12 | Disposition: A | Discharge status: Home / Self Care | Payer: Medicare Other | Attending: Emergency Medicine | Admitting: Emergency Medicine

## 2020-07-11 ENCOUNTER — Encounter (HOSPITAL_COMMUNITY): Payer: Self-pay | Admitting: *Deleted

## 2020-07-11 ENCOUNTER — Other Ambulatory Visit: Payer: Self-pay

## 2020-07-11 DIAGNOSIS — Z7982 Long term (current) use of aspirin: Secondary | ICD-10-CM | POA: Diagnosis not present

## 2020-07-11 DIAGNOSIS — Z20822 Contact with and (suspected) exposure to covid-19: Secondary | ICD-10-CM | POA: Diagnosis not present

## 2020-07-11 DIAGNOSIS — F332 Major depressive disorder, recurrent severe without psychotic features: Secondary | ICD-10-CM | POA: Diagnosis not present

## 2020-07-11 DIAGNOSIS — E119 Type 2 diabetes mellitus without complications: Secondary | ICD-10-CM | POA: Insufficient documentation

## 2020-07-11 DIAGNOSIS — I10 Essential (primary) hypertension: Secondary | ICD-10-CM | POA: Insufficient documentation

## 2020-07-11 DIAGNOSIS — Z79899 Other long term (current) drug therapy: Secondary | ICD-10-CM | POA: Insufficient documentation

## 2020-07-11 DIAGNOSIS — R45851 Suicidal ideations: Secondary | ICD-10-CM | POA: Diagnosis not present

## 2020-07-11 DIAGNOSIS — Z7984 Long term (current) use of oral hypoglycemic drugs: Secondary | ICD-10-CM | POA: Insufficient documentation

## 2020-07-11 HISTORY — DX: Unspecified hearing loss, unspecified ear: H91.90

## 2020-07-11 LAB — CBC WITH DIFFERENTIAL/PLATELET
Abs Immature Granulocytes: 0.03 10*3/uL (ref 0.00–0.07)
Basophils Absolute: 0.1 10*3/uL (ref 0.0–0.1)
Basophils Relative: 1 %
Eosinophils Absolute: 0.1 10*3/uL (ref 0.0–0.5)
Eosinophils Relative: 2 %
HCT: 50 % (ref 39.0–52.0)
Hemoglobin: 17.2 g/dL — ABNORMAL HIGH (ref 13.0–17.0)
Immature Granulocytes: 0 %
Lymphocytes Relative: 21 %
Lymphs Abs: 1.4 10*3/uL (ref 0.7–4.0)
MCH: 29.2 pg (ref 26.0–34.0)
MCHC: 34.4 g/dL (ref 30.0–36.0)
MCV: 84.9 fL (ref 80.0–100.0)
Monocytes Absolute: 0.5 10*3/uL (ref 0.1–1.0)
Monocytes Relative: 8 %
Neutro Abs: 4.6 10*3/uL (ref 1.7–7.7)
Neutrophils Relative %: 68 %
Platelets: 215 10*3/uL (ref 150–400)
RBC: 5.89 MIL/uL — ABNORMAL HIGH (ref 4.22–5.81)
RDW: 13.7 % (ref 11.5–15.5)
WBC: 6.7 10*3/uL (ref 4.0–10.5)
nRBC: 0 % (ref 0.0–0.2)

## 2020-07-11 LAB — COMPREHENSIVE METABOLIC PANEL
ALT: 32 U/L (ref 0–44)
AST: 23 U/L (ref 15–41)
Albumin: 4.4 g/dL (ref 3.5–5.0)
Alkaline Phosphatase: 71 U/L (ref 38–126)
Anion gap: 11 (ref 5–15)
BUN: 13 mg/dL (ref 8–23)
CO2: 24 mmol/L (ref 22–32)
Calcium: 9 mg/dL (ref 8.9–10.3)
Chloride: 100 mmol/L (ref 98–111)
Creatinine, Ser: 1.09 mg/dL (ref 0.61–1.24)
GFR calc Af Amer: >60 mL/min (ref >60)
GFR calc non Af Amer: >60 mL/min (ref >60)
Glucose, Bld: 416 mg/dL — ABNORMAL HIGH (ref 70–99)
Potassium: 3.6 mmol/L (ref 3.5–5.1)
Sodium: 135 mmol/L (ref 135–145)
Total Bilirubin: 2.6 mg/dL — ABNORMAL HIGH (ref 0.3–1.2)
Total Protein: 7.7 g/dL (ref 6.5–8.1)

## 2020-07-11 LAB — RAPID URINE DRUG SCREEN, HOSP PERFORMED
Amphetamines: NOT DETECTED
Barbiturates: NOT DETECTED
Benzodiazepines: NOT DETECTED
Cocaine: NOT DETECTED
Opiates: NOT DETECTED
Tetrahydrocannabinol: NOT DETECTED

## 2020-07-11 LAB — SARS CORONAVIRUS 2 BY RT PCR (HOSPITAL ORDER, PERFORMED IN ~~LOC~~ HOSPITAL LAB): SARS Coronavirus 2: NEGATIVE

## 2020-07-11 LAB — ETHANOL: Alcohol, Ethyl (B): <10 mg/dL (ref <10)

## 2020-07-11 MED ORDER — LISINOPRIL 10 MG PO TABS
20.0000 mg | ORAL_TABLET | Freq: Every day | ORAL | Status: DC
  Administered 2020-07-11: 20 mg via ORAL
  Filled 2020-07-11: qty 2

## 2020-07-11 MED ORDER — METFORMIN HCL 500 MG PO TABS: 1000.0000 mg | ORAL_TABLET | Freq: Two times a day (BID) | ORAL | Status: DC

## 2020-07-11 MED ORDER — ONDANSETRON HCL 4 MG PO TABS: 4.0000 mg | ORAL_TABLET | Freq: Three times a day (TID) | ORAL | Status: DC | PRN

## 2020-07-11 MED ORDER — ACETAMINOPHEN 325 MG PO TABS: 650.0000 mg | ORAL_TABLET | ORAL | Status: DC | PRN

## 2020-07-11 MED ORDER — HYDROCODONE-ACETAMINOPHEN 5-325 MG PO TABS
1.0000 | ORAL_TABLET | Freq: Every day | ORAL | Status: DC | PRN
  Administered 2020-07-11: 1 via ORAL
  Filled 2020-07-11: qty 1

## 2020-07-11 NOTE — ED Notes (Addendum)
This RN to bedside for rounding and at request of patient Air cabin crew. Found patient to be reporting shortness of breath, states "I don't know what happened. I fell asleep and than all of a sudden, I woke up and felt like I was suffocating." This RN notes that patient's SpO2 on room air is 99%, HR 77. Pt states "I usually use a C-Pap." Pt placed on 2L [place] for ease of anxiety. Pt reports an improvement at this time.
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
## 2020-07-11 NOTE — ED Notes (Signed)
Pt began stating that they wanted to leave and that he could "simply walk away". Told pt that that would be unacceptable and it would be best for him to get help. RN Shirlean Mylar notified of pt's statements.
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
## 2020-07-11 NOTE — ED Notes (Addendum)
I assumed care of this patient at this time. At the time of assuming care, patient changed into maroon scrubs and behavioral sitter at bedside.  Pt appears in no acute distress, reports right leg numbness and pain that is new in onset.  While at bedside for assessment, pt stated "I won't hurt myself or nobody. I couldn't even hurt a fly. I just said something that I shouldn't have. I just let it out and look where it's landed me." Pt states "There's been a lot of things that have happened in my life and it really weighs on me." Pt appears tearful during assessment.  Pt educated on plan of care at this time, and is in agreement at this time. Pt educated on process and is encouraged to remain in ED with staff.  Pt placed in front of nurses station for close observation.

## 2020-07-11 NOTE — Discharge Instructions (Addendum)
Substance Abuse Treatment Programs ° °Intensive Outpatient Programs °[facility]     °[address]      °[place], [place]                   °[phone]      ° °[facility] °[address] °[place], [place] °[phone] ° °[facility]     °(Inpatient and outpatient)     °[address]           °[phone]   ° °[facility] °[phone] (Suboxone and Methadone) ° °[address]      °[place], [place] [address]      °[phone]      ° °[address] °[place], [place] °852-3033 ° °[facility] (Outpatient/Inpatient, [facility])    °(insurance only) [phone]      °       °[facility] (Groups & Residential) °[place], [place] °[phone] ° °   °[facility]     °[address]     °[place], [place]      °[phone]      ° °[facility] (for caregivers and family) °[address] °[place], [place] °336-299-4655 ° ° ° ° ° °Residential Treatment Programs °[facility]      °[address], [place], [place] [address]  °[phone]      ° °[facility] °[address] [place], [place] [address] °[phone] ° °[facility]        °[phone]      ° °[facility] °[phone] ° °[facility]             °[address]                                         °[place], [place]                                                °[phone] or [phone]                              ° °[facility] [place] °[address] °[place], [address] °[phone] ° °[facility]    °[address]      °, [place]     °[phone]      ° °[facility] °[address] °, [place] °336-285-9073 ° °[facility]   °[address]     °[place], [place] [address]     °[phone]      °Admissions: 8am-3pm M-F ° °[facility] °[address] °[place],  [place] °336-227-7417 ° °[facility]: Residential Program (90 Days)   °[place], [place]      °336-725-8389 or [phone]    ° °[facility] °[place], [place] °(Walk in Hours over the weekend or by referral) ° °[facility] °[address], [place],  [address] °([phone] ° °Crisis Mobile: [facility]:  1-877-626-1772 (for crisis response 24 hours a day) °[facility] Hotline:      1-800-256-2452 °Outpatient Psychiatry and Counseling ° °[facility]: Mobile Crisis   Management 24 hours:  1-877-626-1772 ° °[facility] sliding scale fee and walk in schedule: M-F 8am-12pm/1pm-3pm °[address]  °[place], [place] [address] °[phone] ° °[facility] °[address] °[place], [place] [address] °[phone] ° °[facility] ([facility])- new patient walk-in appointments available Monday - Friday 8am -3pm.          °[address] °[place], [place] [address] °336-676-6840 or crisis line- 336-676-6905 ° ° [facility] Intensive Outpatient Therapy Program °[address] °[place], [place] [address] °336-832-9804 ° °[facility]                  °Crisis Services      °[phone]      °[address]     °[place], [place] [address]                ° °[facility]   °[facility] °[phone] °[address] °[place], [place] [address] ° ° °[facility]          °[address]  °[place], [place] [address]       °[phone] ° °[facility] °[address] °[place], [place] [address] °336-292-1510 ° °[facility]    °[address]    °[place], [place] [address]     °[phone]      ° °Parish McKinney, MD     °[address]     °[place] [address]     °336-282-1251     °  °[facility] °[address] °[place] [address] ° °[facility]     °[address]     °[place], [place]      °336-542-2076      ° °[facility] °Shamsher Ahluwalia, MD °[address] °[place], [place] [address] °[phone] ° °[facility]     °[address]     °[place], [place] [address]     °336-274-1237      ° °[facility] °[address] °[place], [place] [address] °336-854-4450 °Resources for Temporary Residential Assistance/Crisis Centers ° °DAY CENTERS °[facility] °M-F 8am-3pm   °[address] [place], [place] [address]   336-332-0824 °Services include: laundry, barbering, support groups, case management, phone  & computer access, showers, AA/NA mtgs, mental health/substance abuse nurse, job skills class, disability information, VA assistance, spiritual classes, etc.  ° °HOMELESS SHELTERS ° °[facility]     °[facility]   °[address], [place]     °[phone]       °       °[facility] (women and children)       °[address] °, [place] [address] °336-275-0820 °[email or web address] for application and process °Application Required ° °[facility]   °[address]    °[place] [address]     °[phone]       °             °[facility] °[address] °, [place] [address] °336.273.5572 °336-235-0363(schedule application appt.) °Application Required ° °[facility] (women only)    °[address]     °[place],  [address]     °336-884-1039      °  Intake starts 6pm daily °Need valid ID, SSC, & Police report °[facility] [place] °[address] °[place], [place] °336-881-5420 °Application Required ° °[facility] (men only)     °[address]      °[place], [place]     °[phone]      ° °[facility] °(Pregnant women only) °[address] °[place], [place] °336-275-0206 ° °[facility]      °[address]      °[place], [place] [address]     °[phone]      °       °[facility] °[address] °[place], [place] °336-723-1848 °90 day commitment/SA/Application process ° °[facility](men only)     °[address]     °[place], [place]     °[phone]       °Check-in at 7pm     °       °[facility] °[address] °[place], [place] [address] °336-248-6684 °Men/Women/Women and Children must be there by 7 pm ° °[facility] °[place], [place] °336-722-8721                ° °

## 2020-07-11 NOTE — BH Assessment (Signed)
Tele Assessment Note   Patient Name: [name] [name] MRN: [id number] Referring Physician: Deno Etienne Location of Patient: [facility] Location of Provider: [place] is an 69 y.o. male who presented to [facility] with the police.  Patient states that he lives with his wife who is diabetic and an amputee.  He states that he is her primary care taker and he states that he has to do everything for her.  He states that she gripes and complains all the time and he states that she has run all the home health care aids off leaving him with everything to do. Patient states that he is really frustrated with his home situation and today, after he argued with her, he states that he was upset and had suicidal thoughts and states that he called the police and stated that he was thinking about shooting himself.  However, patient states that he did not do it and he did not even get a gun out. Patient states that he was just upset at the time and not thinking clearly.  He states that he is not suicidal and he would never do anything to harm himself or anyone else.  Patient states that he has seen a psychiatrist Jaclyn Shaggy) in the past approximately 1 year ago for anxiety and depression, but he states that he has no current provider and states that he has never been in an inpatient treatment facility.  Patient denies HI/Psychosis and states that he has no substance use issues.  Patient states that he is sleep deprived and states that on average that he is only sleeping three hours a night.  He states that he has not been eating well and he has lost a lot of weight, but he is not sure how much.  Patient denies any history of abuse or self-mutilation.  TTS attempted to contact patient's sister, Gretta Began [phone] / [phone], for collateral information, but she was not available.  A HIPPA compliant message was left on both lines requesting a return phone  call.  Patient is presenting as alert and oriented.  His mood is depressed and he is frustrated with his current living situation, therefore, he is somewhat irritable, but cooperative.  His thoughts are organized and his memory intact.  He does not appear to be responding to any internal stimuli.  His insight, judgment and impulse control are mildly impaired.  Diagnosis: F33.2 MDD Recurrent Severe  Past Medical History:  Past Medical History:  Diagnosis Date  . Anxiety   . 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 Borderline diabetes mellitus   . Depression with anxiety   . Diabetes mellitus without complication ([place])   . GERD (gastroesophageal reflux disease)   . HOH (hard of hearing)   . Hypertension   . Obstructive sleep apnea on CPAP   . Stroke [facility])     Past Surgical History:  Procedure Laterality Date  . RECTAL SURGERY     As an infant  . TONSILLECTOMY    . UMBILICAL HERNIA REPAIR      Family History:  Family History  Problem Relation Age of Onset  . Sudden death Sister   . Dementia Other   . Alzheimer's disease Other   . Diabetes Other   . Coronary artery disease Other   . Heart attack Other   . Cancer Other   . Lung cancer Other   . Cancer Mother  lung  . Diabetes Mother   . Cancer Father   . Diabetes Father   . Colon cancer Neg Hx     Social History:  reports that he has never smoked. He has never used smokeless tobacco. He reports previous alcohol use. He reports that he does not use drugs.  Additional Social History:  Alcohol / Drug Use Pain Medications: see MAR Prescriptions: see MAR Over the Counter: see MAR History of alcohol / drug use?: No history of alcohol / drug abuse Longest period of sobriety (when/how long): N/A  CIWA: CIWA-Ar BP: (!) 209/113 Pulse Rate: (!) 117 COWS:    Allergies: No Known Allergies  Home Medications: (Not in a hospital admission)   OB/GYN Status:  No LMP for male patient.  General Assessment  Data Location of Assessment: [facility] TTS Assessment: In system Is this a Tele or Face-to-Face Assessment?: Tele Assessment Is this an Initial Assessment or a Re-assessment for this encounter?: Initial Assessment Patient Accompanied by:: Other (police) Language Other than English: No Living Arrangements: Other (Comment) (lives with wifein an apartment) What gender do you identify as?: Male Date Telepsych consult ordered in [facility]: 07/11/20 Time Telepsych consult ordered in [facility]: 1245 Marital status: Married Living Arrangements: Spouse/significant other Can pt return to current living arrangement?: Yes Admission Status: Voluntary Is patient capable of signing voluntary admission?: Yes Referral Source: Other (police) Insurance type: Medicare     Crisis Care Plan Living Arrangements: Spouse/significant other Legal Guardian:  (self) Name of Psychiatrist: none Name of Therapist: none  Education Status Is patient currently in school?: No Is the patient employed, unemployed or receiving disability?: Unemployed  Risk to self with the past 6 months Suicidal Ideation: Yes-Currently Present Has patient been a risk to self within the past 6 months prior to admission? : No Suicidal Intent: No Has patient had any suicidal intent within the past 6 months prior to admission? : No Is patient at risk for suicide?: No Suicidal Plan?: Yes-Currently Present (to shoot self) Has patient had any suicidal plan within the past 6 months prior to admission? : No Specify Current Suicidal Plan: threats to shoot self with his gun Access to Means: Yes Specify Access to Suicidal Means: has a gun What has been your use of drugs/alcohol within the last 12 months?: none Previous Attempts/Gestures: No How many times?: 0 Other Self Harm Risks: caretaker for sick wife Triggers for Past Attempts: None known Intentional Self Injurious Behavior: None Family Suicide History: No Recent stressful life event(s):  Divorce Persecutory voices/beliefs?: No Depression: Yes Depression Symptoms: Despondent, Isolating, Fatigue, Loss of interest in usual pleasures Substance abuse history and/or treatment for substance abuse?: No  Risk to Others within the past 6 months Homicidal Ideation: No Does patient have any lifetime risk of violence toward others beyond the six months prior to admission? : No Thoughts of Harm to Others: No Current Homicidal Intent: No Current Homicidal Plan: No Access to Homicidal Means: No Identified Victim: none History of harm to others?: No Assessment of Violence: None Noted Violent Behavior Description: none Does patient have access to weapons?: Yes (Comment) Criminal Charges Pending?: No Does patient have a court date: No Is patient on probation?: No  Psychosis Hallucinations: None noted Delusions: None noted  Mental Status Report Appearance/Hygiene: Unremarkable Eye Contact: Good Motor Activity: Freedom of movement Speech: Logical/coherent Level of Consciousness: Alert Mood: Depressed Affect: Appropriate to circumstance Anxiety Level: Moderate Thought Processes: Coherent, Relevant Judgement: Impaired Orientation: Person, Place, Time, Situation Obsessive Compulsive Thoughts/Behaviors:  None  Cognitive Functioning Concentration: Normal Memory: Recent Intact, Remote Intact Is patient IDD: No Insight: Poor Impulse Control: Fair Appetite: Poor Have you had any weight changes? : Loss Amount of the weight change? (lbs):  (unknown amount) Sleep: Decreased Total Hours of Sleep: 5 Vegetative Symptoms: None  ADLScreening [facility] Assessment Services) Patient's cognitive ability adequate to safely complete daily activities?: Yes Patient able to express need for assistance with ADLs?: Yes Independently performs ADLs?: Yes (appropriate for developmental age)  Prior Inpatient Therapy Prior Inpatient Therapy: No  Prior Outpatient Therapy Prior Outpatient Therapy:  Yes Prior Therapy Dates: last year Prior Therapy Facilty/Provider(s): Jaclyn Shaggy last year Reason for Treatment: PTDS / depression Does patient have an ACCT team?: No Does patient have Intensive In-House Services?  : No Does patient have [facility] services? : No Does patient have P4CC services?: No  ADL Screening (condition at time of admission) Patient's cognitive ability adequate to safely complete daily activities?: Yes Is the patient deaf or have difficulty hearing?: No Does the patient have difficulty seeing, even when wearing glasses/contacts?: No Does the patient have difficulty concentrating, remembering, or making decisions?: No Patient able to express need for assistance with ADLs?: Yes Does the patient have difficulty dressing or bathing?: No Independently performs ADLs?: Yes (appropriate for developmental age) Does the patient have difficulty walking or climbing stairs?: No Weakness of Legs: None Weakness of Arms/Hands: None  Home Assistive Devices/Equipment Home Assistive Devices/Equipment: None  Therapy Consults (therapy consults require a physician order) PT Evaluation Needed: No OT Evalulation Needed: No SLP Evaluation Needed: No Abuse/Neglect Assessment (Assessment to be complete while patient is alone) Abuse/Neglect Assessment Can Be Completed: Yes Physical Abuse: Denies Verbal Abuse: Denies Sexual Abuse: Denies Exploitation of patient/patient's resources: Denies Self-Neglect: Denies Values / Beliefs Cultural Requests During Hospitalization: None Spiritual Requests During Hospitalization: None Consults Spiritual Care Consult Needed: No Transition of Care Team Consult Needed: No Advance Directives (For Healthcare) Does Patient Have a Medical Advance Directive?: No Would patient like information on creating a medical advance directive?: No - Patient declined Nutrition Screen- [facility] Adult/[facility] Has the patient recently lost weight without trying?: Yes,  2-13 lbs. Has the patient been eating poorly because of a decreased appetite?: Yes Malnutrition Screening Tool Score: 2        Disposition: Per Waylan Boga, DNP, patient will need to be monitored for safety and stability overnight and will be re-assessed in the morning after collateral information is obtained.   Disposition Initial Assessment Completed for this Encounter: Yes  This service was provided via telemedicine using a 2-way, interactive audio and video technology.  Names of all persons participating in this telemedicine service and their role in this encounter. Name: Izeah Vossler Role: Patient  Name: Kasandra Knudsen Uno Esau Role: TTS  Name:  Role:   Name:  Role:     Reatha Armour 07/11/2020 3:21 PM

## 2020-07-12 DIAGNOSIS — R45851 Suicidal ideations: Secondary | ICD-10-CM | POA: Diagnosis not present

## 2020-07-12 NOTE — Progress Notes (Signed)
CSW spoke with patient's sister, Gretta Began [phone]. She reports that she visited pt's home earlier today and that pt's wife, Vita Barley, stated that there were no guns in the home and that there, "never has been". Darlene shared that pt's wife, "treats him like a dog - worse than a dog" but that she feels he will be okay if psychiatrically cleared and discharged home. CSW will notify [facility] NP of conversation.    Audree Camel, MSW, LCSW, [place] Clinical Social Worker II Disposition CSW [phone]

## 2020-07-12 NOTE — ED Notes (Signed)
Pt moved the family rm for TTS with sitter.

## 2020-07-12 NOTE — [facility] Counselor (Signed)
Assessment update 07/12/2020 Patient is oriented x5, engaged, alert and reports that he is in a good mood. Pt speech is clear and coherent, his eye contact was good he is smiling and making jokes with clinician. Pt stated that he did not sleep well last night but he was ready to go home and drive his brand-new BMW. When discussing reason pt was in ED pt stated "I said something I should not have said and was talking about a GUN (pt spelled the word gun to clinician)". Pt stated that him and his wife was having a misunderstanding and they were calling each other names and saying bad things to each other. 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 Pt states that he has to take care of his wife like a child. Pt states that his wife use to have personal care workers but has ran off all her personal care workers. Pt reports difficulty sleeping at home due to his neighbors making noise at night. Pt reports having therapy about a year ago with Arnette Schaumann in [place]. Pt stated that he would be interested in starting therapy again. Pt denied every seeing a psychiatrist. Counselor discussed safety plan for pt when going home and attempted to get consent to talk with his wife but pt declined. Pt also stated that his sister had COVID and was not able to get to the phone. Pt stated that he would like to keep everything confidential. Pt denied having current si/hi/avh and denied depressive symptoms. Pt stated that his guns are in a lock box and he plans to sale his guns when he returns home. When discussing ways of dealing with the stress of taking care of his wife and dealing with his anger pt reports that he will leave his apartment and take a drive next time and/or go outside and take it out on the tree and fence (pt proceeds to laugh).  After consulting with Elmarie Shiley, NP it was determined that pt needed to have all guns removed from home before he could be discharged. Pt was informed of this and provided  consent to talk to his family. Pt wife Lamere Lightner [phone]) stated that she is willing to get rid of guns but needed help doing so due to mobility issues. Pt sister Gretta Began [phone] was contacted and notified of pt ED visit. Pt sister reports that to the best of her knowledge pt has never had any suicidal attempts and has never said any suicidal statements. Darlene states that pt was in special education classes in school and he had difficulty holding a job. Carlyon Shadow is willing and able to secure guns from pt house. TTS will need to confirm that Carlyon Shadow has guns secure before pt can be discharged from ED Disposition: Per Elmarie Shiley, NP, all guns need to be removed from house and secure. TTS will need to confirm with sister Carlyon Shadow that guns are removed and secure before pt can be discharged home.

## 2020-07-12 NOTE — Consult Note (Signed)
Telepsych Consultation   Reason for Consult:  Depression with si Referring Physician: EDP Location of Patient: [facility] Location of Provider: [facility]  Patient Identification: [name] [name] MRN:  [id number] Principal Diagnosis: Suicidal ideation Diagnosis:  Principal Problem:   Suicidal ideation   Total Time spent with patient: 20 minutes  Subjective:   [name] [name] is a 69 y.o. male patient admitted with increased stress due to conflict with wife. He reports making a suicidal comment to police.   HPI:    Per initial [place] Assessment by Beecher Mcardle, LCAS 07/11/2020:  [name] [name] is an 69 y.o. male who presented to [facility] with the police.  Patient states that he lives with his wife who is diabetic and an amputee.  He states that he is her primary care taker and he states that he has to do everything for her.  He states that she gripes and complains all the time and he states that she has run all the home health care aids off leaving him with everything to do. Patient states that he is really frustrated with his home situation and today, after he argued with her, he states that he was upset and had suicidal thoughts and states that he called the police and stated that he was thinking about shooting himself.  However, patient states that he did not do it and he did not even get a gun out. Patient states that he was just upset at the time and not thinking clearly.  He states that he is not suicidal and he would never do anything to harm himself or anyone else.  Patient states that he has seen a psychiatrist Jaclyn Shaggy) in the past approximately 1 year ago for anxiety and depression, but he states that he has no current provider and states that he has never been in an inpatient treatment facility.  Patient denies HI/Psychosis and states that he has no substance use issues.  Patient states that he is sleep deprived and states that on average that he is  only sleeping three hours a night.  He states that he has not been eating well and he has lost a lot of weight, but he is not sure how much.  Patient denies any history of abuse or self-mutilation.  TTS attempted to contact patient's sister, Gretta Began [phone] / [phone], for collateral information, but she was not available.  A HIPPA compliant message was left on both lines requesting a return phone call.  Patient is presenting as alert and oriented.  His mood is depressed and he is frustrated with his current living situation, therefore, he is somewhat irritable, but cooperative.  His thoughts are organized and his memory intact.  He does not appear to be responding to any internal stimuli.  His insight, judgment and impulse control are mildly impaired.    Per assessment on 07/12/2020:  Patient was calm and cooperative during the assessment. He expresses his desire to be discharged home he states "I am not suicidal. I never was. I just made a statement out of frustration. Things are very hard at home with my wife. It just got to me the other day. I love my life. All the guns are gone. There is no reason that I can't go back home. I am ready."Patient was cooperative today in allowing TTS staff to contact his family so that any guns in the home could be removed. Per TTS notes the guns per his sister  have been removed. At this time patient is not meeting the criteria for inpatient psychiatric treatment. He is psychiatrically cleared to be discharged home. Case discussed with Dr. Dwyane Dee.     Past Psychiatric History: MDD  Risk to Self: Suicidal Ideation: Denies Suicidal Intent: No Is patient at risk for suicide?: No Suicidal Plan?: Before admission made suicidal comment but now denies Specify Current Suicidal Plan: threats to shoot self with his gun prior to admission when feeling frustrated with his wife Access to Means: Yes Specify Access to Suicidal Means: None guns have been  removed from the home, he denies other plans of suicide What has been your use of drugs/alcohol within the last 12 months?: none How many times?: 0 Other Self Harm Risks: caretaker for sick wife Triggers for Past Attempts: None known Intentional Self Injurious Behavior: None Risk to Others: Homicidal Ideation: No Thoughts of Harm to Others: No Current Homicidal Intent: No Current Homicidal Plan: No Access to Homicidal Means: No Identified Victim: none History of harm to others?: No Assessment of Violence: None Noted Violent Behavior Description: none Does patient have access to weapons?: Yes (Comment) Criminal Charges Pending?: No Does patient have a court date: No Prior Inpatient Therapy: Prior Inpatient Therapy: No Prior Outpatient Therapy: Prior Outpatient Therapy: Yes Prior Therapy Dates: last year Prior Therapy Facilty/Provider(s): Jaclyn Shaggy last year Reason for Treatment: PTDS / depression Does patient have an ACCT team?: No Does patient have Intensive In-House Services?  : No Does patient have [facility] services? : No Does patient have P4CC services?: No  Past Medical History:  Past Medical History:  Diagnosis Date  . Anxiety   . 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 Borderline diabetes mellitus   . Depression with anxiety   . Diabetes mellitus without complication ([place])   . GERD (gastroesophageal reflux disease)   . HOH (hard of hearing)   . Hypertension   . Obstructive sleep apnea on CPAP   . Stroke [facility])     Past Surgical History:  Procedure Laterality Date  . RECTAL SURGERY     As an infant  . TONSILLECTOMY    . UMBILICAL HERNIA REPAIR     Family History:  Family History  Problem Relation Age of Onset  . Sudden death Sister   . Dementia Other   . Alzheimer's disease Other   . Diabetes Other   . Coronary artery disease Other   . Heart attack Other   . Cancer Other   . Lung cancer Other   . Cancer Mother        lung  .  Diabetes Mother   . Cancer Father   . Diabetes Father   . Colon cancer Neg Hx    Family Psychiatric  History:  Social History:  Social History   Substance and Sexual Activity  Alcohol Use Not Currently   Comment: rarely     Social History   Substance and Sexual Activity  Drug Use No    Social History   Socioeconomic History  . Marital status: Married    Spouse name: Not on file  . Number of children: 0  . Years of education: Not on file  . Highest education level: Not on file  Occupational History  . Occupation: Disabled  Tobacco Use  . Smoking status: Never Smoker  . Smokeless tobacco: Never Used  Vaping Use  . Vaping Use: Never used  Substance and Sexual Activity  . Alcohol  use: Not Currently    Comment: rarely  . Drug use: No  . Sexual activity: Never  Other Topics Concern  . Not on file  Social History Narrative   Lives at home with his wife.   Right-handed.   2 cups caffeine coffee per day and a few sodas.   Has been on disability all his life due to significant decrease in vision and bowel problems         Social Determinants of Health   Financial Resource Strain:   . Difficulty of Paying Living Expenses: Not on file  Food Insecurity:   . Worried About Charity fundraiser in the Last Year: Not on file  . Ran Out of Food in the Last Year: Not on file  Transportation Needs:   . Lack of Transportation (Medical): Not on file  . Lack of Transportation (Non-Medical): Not on file  Physical Activity:   . Days of Exercise per Week: Not on file  . Minutes of Exercise per Session: Not on file  Stress:   . Feeling of Stress : Not on file  Social Connections:   . Frequency of Communication with Friends and Family: Not on file  . Frequency of Social Gatherings with Friends and Family: Not on file  . Attends Religious Services: Not on file  . Active Member of Clubs or Organizations: Not on file  . Attends Archivist Meetings: Not on file  .  Marital Status: Not on file   Additional Social History:    Allergies:  No Known Allergies  Labs:  Results for orders placed or performed during the hospital encounter of 07/11/20 (from the past 48 hour(s))  SARS Coronavirus 2 by RT PCR (hospital order, performed in [facility] hospital lab) Nasopharyngeal Nasopharyngeal Swab     Status: None   Collection Time: 07/11/20 12:15 PM   Specimen: Nasopharyngeal Swab  Result Value Ref Range   SARS Coronavirus 2 NEGATIVE NEGATIVE    Comment: (NOTE) SARS-CoV-2 target nucleic acids are NOT DETECTED.  The SARS-CoV-2 RNA is generally detectable in upper and lower respiratory specimens during the acute phase of infection. The lowest concentration of SARS-CoV-2 viral copies this assay can detect is 250 copies / mL. A negative result does not preclude SARS-CoV-2 infection and should not be used as the sole basis for treatment or other patient management decisions.  A negative result may occur with improper specimen collection / handling, submission of specimen other than nasopharyngeal swab, presence of viral mutation(s) within the areas targeted by this assay, and inadequate number of viral copies (<250 copies / mL). A negative result must be combined with clinical observations, patient history, and epidemiological information.  Fact Sheet for Patients:   StrictlyIdeas.no  Fact Sheet for Healthcare Providers: BankingDealers.co.za  This test is not yet approved or  cleared by the [place] FDA and has been authorized for detection and/or diagnosis of SARS-CoV-2 by FDA under an Emergency Use Authorization (EUA).  This EUA will remain in effect (meaning this test can be used) for the duration of the COVID-19 declaration under Section 564(b)(1) of the Act, 21 U.S.C. section 360bbb-3(b)(1), unless the authorization is terminated or revoked sooner.  Performed at [facility] [place], [address], [place], [place] [address]   Urine rapid drug screen (hosp performed)     Status: None   Collection Time: 07/11/20 12:15 PM  Result Value Ref Range   Opiates NONE DETECTED NONE DETECTED   Cocaine NONE DETECTED NONE  DETECTED   Benzodiazepines NONE DETECTED NONE DETECTED   Amphetamines NONE DETECTED NONE DETECTED   Tetrahydrocannabinol NONE DETECTED NONE DETECTED   Barbiturates NONE DETECTED NONE DETECTED    Comment: (NOTE) DRUG SCREEN FOR MEDICAL PURPOSES ONLY.  IF CONFIRMATION IS NEEDED FOR ANY PURPOSE, NOTIFY LAB WITHIN 5 DAYS.  LOWEST DETECTABLE LIMITS FOR URINE DRUG SCREEN Drug Class                     Cutoff (ng/mL) Amphetamine and metabolites    1000 Barbiturate and metabolites    200 Benzodiazepine                 662 Tricyclics and metabolites     300 Opiates and metabolites        300 Cocaine and metabolites        300 THC                            50 Performed at [facility] [place], [place] [address]   Comprehensive metabolic panel     Status: Abnormal   Collection Time: 07/11/20 12:27 PM  Result Value Ref Range   Sodium 135 135 - 145 mmol/L   Potassium 3.6 3.5 - 5.1 mmol/L   Chloride 100 98 - 111 mmol/L   CO2 24 22 - 32 mmol/L   Glucose, Bld 416 (H) 70 - 99 mg/dL    Comment: Glucose reference range applies only to samples taken after fasting for at least 8 hours.   BUN 13 8 - 23 mg/dL   Creatinine, Ser 1.09 0.61 - 1.24 mg/dL   Calcium 9.0 8.9 - 10.3 mg/dL   Total Protein 7.7 6.5 - 8.1 g/dL   Albumin 4.4 3.5 - 5.0 g/dL   AST 23 15 - 41 U/L   ALT 32 0 - 44 U/L   Alkaline Phosphatase 71 38 - 126 U/L   Total Bilirubin 2.6 (H) 0.3 - 1.2 mg/dL   GFR calc non Af Amer >60 >60 mL/min   GFR calc Af Amer >60 >60 mL/min   Anion gap 11 5 - 15    Comment: Performed at [facility], [address] [place], [place] [address]  Ethanol     Status: None   Collection Time: 07/11/20 12:27 PM  Result Value Ref Range   Alcohol, Ethyl (B) <10 <10 mg/dL     Comment: (NOTE) Lowest detectable limit for serum alcohol is 10 mg/dL.  For medical purposes only. Performed at [facility] [address], [place], [place] [address]   CBC with Diff     Status: Abnormal   Collection Time: 07/11/20 12:27 PM  Result Value Ref Range   WBC 6.7 4.0 - 10.5 K/uL   RBC 5.89 (H) 4.22 - 5.81 MIL/uL   Hemoglobin 17.2 (H) 13.0 - 17.0 g/dL   HCT 50.0 39 - 52 %   MCV 84.9 80.0 - 100.0 fL   MCH 29.2 26.0 - 34.0 pg   MCHC 34.4 30.0 - 36.0 g/dL   RDW 13.7 11.5 - 15.5 %   Platelets 215 150 - 400 K/uL   nRBC 0.0 0.0 - 0.2 %   Neutrophils Relative % 68 %   Neutro Abs 4.6 1.7 - 7.7 K/uL   Lymphocytes Relative 21 %   Lymphs Abs 1.4 0.7 - 4.0 K/uL   Monocytes Relative 8 %   Monocytes Absolute 0.5 0 - 1 K/uL  Eosinophils Relative 2 %   Eosinophils Absolute 0.1 0 - 0 K/uL   Basophils Relative 1 %   Basophils Absolute 0.1 0 - 0 K/uL   Immature Granulocytes 0 %   Abs Immature Granulocytes 0.03 0.00 - 0.07 K/uL    Comment: Performed at [facility], [address] [place], [place] [address]    Medications:  Current Facility-Administered Medications  Medication Dose Route Frequency Provider Last Rate Last Admin  . acetaminophen (TYLENOL) tablet 650 mg  650 mg Oral Q4H PRN Marcello Fennel, PA-C      . HYDROcodone-acetaminophen (NORCO/VICODIN) 5-325 MG per tablet 1 tablet  1 tablet Oral Daily PRN Marcello Fennel, PA-C   1 tablet at 07/11/20 1532  . lisinopril (ZESTRIL) tablet 20 mg  20 mg Oral Daily Marcello Fennel, PA-C   20 mg at 07/11/20 1538  . metFORMIN (GLUCOPHAGE) tablet 1,000 mg  1,000 mg Oral BID Marcello Fennel, PA-C      . ondansetron [facility]) tablet 4 mg  4 mg Oral Q8H PRN Marcello Fennel, PA-C       Current Outpatient Medications  Medication Sig Dispense Refill  . amitriptyline (ELAVIL) 50 MG tablet TAKE ONE TABLET BY MOUTH AT BEDTIME 30 tablet 2  . amLODipine (NORVASC) 5 MG tablet TAKE ONE TABLET BY MOUTH DAILY. (,EVENING) 30  tablet 5  . aspirin 325 MG tablet Take 1 tablet (325 mg total) by mouth daily.    Marland Kitchen BYDUREON 2 MG PEN INJECT TWO MG SUBCUTANEOUSLY ONCE A WEEK 4 each 1  . DULoxetine (CYMBALTA) 30 MG capsule Take 1 capsule (30 mg total) by mouth daily. 90 capsule 1  . furosemide (LASIX) 20 MG tablet TAKE ONE TABLET BY MOUTH IN THE MORNING FOR BLOOD PRESSURE AND FLUID (MORNING) 30 tablet 5  . HYDROcodone-acetaminophen (NORCO/VICODIN) 5-325 MG tablet Take 1 tablet by mouth daily as needed for severe pain. Reported on 05/08/2016 30 tablet 0  . HYDROcodone-acetaminophen (NORCO/VICODIN) 5-325 MG tablet Take 1 tablet by mouth daily as needed for moderate pain. Do not refill until 30 days from prescription date 30 tablet 0  . HYDROcodone-acetaminophen (NORCO/VICODIN) 5-325 MG tablet Take 1 tablet by mouth daily as needed for moderate pain. Do not refill until 60 days from prescription 30 tablet 0  . lisinopril (PRINIVIL,ZESTRIL) 20 MG tablet TAKE ONE TABLET BY MOUTH IN THE MORNING FOR BLOOD PRESSURE 30 tablet 5  . metFORMIN (GLUCOPHAGE) 1000 MG tablet TAKE ONE TABLET BY MOUTH TWICE DAILY WITH A MEAL. (MORNING ,EVENING) 180 tablet 0  . metoprolol succinate (TOPROL-XL) 100 MG 24 hr tablet TAKE ONE TABLET BY MOUTH IN THE MORNING 30 tablet 5  . miconazole (MICATIN) 2 % cream Apply 1 application topically 2 (two) times daily. 7 days 28.35 g 0  . nitroGLYCERIN (NITROSTAT) 0.4 MG SL tablet Place 0.4 mg under the tongue every 5 (five) minutes as needed for chest pain.    . potassium chloride (K-DUR,KLOR-CON) 10 MEQ tablet TAKE ONE TABLET BY MOUTH TWICE DAILY (MORNING ,EVENING) 60 tablet 5  . RESTASIS MULTIDOSE 0.05 % ophthalmic emulsion Place 1 drop into both eyes daily.  3  . triamcinolone cream (KENALOG) 0.1 % Apply 1 application topically 2 (two) times daily.      Musculoskeletal: Unable to assess via camera Psychiatric Specialty Exam: Physical Exam  Review of Systems  Blood pressure (!) 167/105, pulse 83, temperature 98.2  F (36.8 C), temperature source Oral, resp. rate 18, height 5\' 1"  (1.549 m), weight 89.4  kg, SpO2 97 %.Body mass index is 37.22 kg/m.  General Appearance: Casual  Eye Contact:  Good  Speech:  Clear and Coherent  Volume:  Normal  Mood:  Anxious  Affect:  Congruent  Thought Process:  Coherent and Goal Directed  Orientation:  Full (Time, Place, and Person)  Thought Content:  WDL  Suicidal Thoughts:  No  Homicidal Thoughts:  No  Memory:  Immediate;   Good Recent;   Good Remote;   Good  Judgement:  Fair  Insight:  Present  Psychomotor Activity:  Normal  Concentration:  Concentration: Good and Attention Span: Good  Recall:  Good  Fund of Knowledge:  Good  Language:  Good  Akathisia:  No  Handed:  Right  AIMS (if indicated):     Assets:  Communication Skills Desire for Improvement Financial Resources/Insurance Housing Intimacy Leisure Time Physical Health Resilience Social Support Talents/Skills  ADL's:  Intact  Cognition:  WNL  Sleep:        Treatment Plan Summary: Plan Discharge to home. Recommend outpatient psychotherapy to help patient cope with stressors.  Disposition: No evidence of imminent risk to self or others at present.   Patient does not meet criteria for psychiatric inpatient admission. Supportive therapy provided about ongoing stressors. Discussed crisis plan, support from social network, calling 911, coming to the Emergency Department, and calling Suicide Hotline.  This service was provided via telemedicine using a 2-way, interactive audio and video technology.  Names of all persons participating in this telemedicine service and their role in this encounter. Name: Elmarie Shiley NP-C Role: Provider  Name: Karrie Doffing Role: Patient  Name:  Role:   Name:  Role:     Elmarie Shiley, NP 07/12/2020 3:10 PM

## 2020-07-12 NOTE — Progress Notes (Signed)
Inpatient Diabetes Program Recommendations  AACE/ADA: New Consensus Statement on Inpatient Glycemic Control (2015)  Target Ranges:  Prepandial:   less than 140 mg/dL      Peak postprandial:   less than 180 mg/dL (1-2 hours)      Critically ill patients:  140 - 180 mg/dL   Lab Results  Component Value Date   GLUCAP 350 (H) 05/05/2015   HGBA1C 10.5 (H) 12/22/2016    Review of Glycemic Control Results for [name] [name], [name] [name] (MRN [id number]) as of 07/12/2020 11:11  Ref. Range 07/11/2020 12:27  Glucose Latest Ref Range: 70 - 99 mg/dL 416 (H)   Diabetes history:  DM2 Outpatient Diabetes medications:  Metformin 1000 mg bid  Inpatient Diabetes Program Recommendations:     -CBG's AC& HS -start SSI sensitive correction tid with meals if CBG's >180 mg/dL  -Carb modified diet -please obtain current A1C   Will continue to follow while inpatient.  Thank you, Reche Dixon, RN, BSN Diabetes Coordinator Inpatient Diabetes Program [phone] (team pager from 8a-5p)

## 2020-07-12 NOTE — ED Provider Notes (Signed)
Blood pressure (!) 167/105, pulse 83, temperature 98.2 F (36.8 C), temperature source Oral, resp. rate 18, height 5\' 1"  (1.549 m), weight 89.4 kg, SpO2 97 %.  In short, [name] [name] is a 69 y.o. male with a chief complaint of V70.1 and Suicidal .  Refer to the original H&P for additional details.  Patient evaluated by TTS today. Denies SI. Psych clear. Ready for discharge.     Margette Fast, MD 07/12/20 [phone]

## 2021-06-12 ENCOUNTER — Emergency Department (HOSPITAL_COMMUNITY): Payer: Medicare Other

## 2021-06-12 ENCOUNTER — Other Ambulatory Visit: Payer: Self-pay

## 2021-06-12 ENCOUNTER — Encounter (HOSPITAL_COMMUNITY): Payer: Self-pay | Admitting: Family Medicine

## 2021-06-12 ENCOUNTER — Observation Stay (HOSPITAL_COMMUNITY)
Admission: EM | Admit: 2021-06-12 | Discharge: 2021-06-13 | Disposition: A | Discharge status: Home / Self Care | Payer: Medicare Other | Attending: Internal Medicine | Admitting: Internal Medicine

## 2021-06-12 DIAGNOSIS — E119 Type 2 diabetes mellitus without complications: Secondary | ICD-10-CM

## 2021-06-12 DIAGNOSIS — K118 Other diseases of salivary glands: Secondary | ICD-10-CM

## 2021-06-12 DIAGNOSIS — D110 Benign neoplasm of parotid gland: Secondary | ICD-10-CM | POA: Diagnosis not present

## 2021-06-12 DIAGNOSIS — N390 Urinary tract infection, site not specified: Secondary | ICD-10-CM | POA: Diagnosis not present

## 2021-06-12 DIAGNOSIS — R2689 Other abnormalities of gait and mobility: Secondary | ICD-10-CM | POA: Diagnosis not present

## 2021-06-12 DIAGNOSIS — F32A Depression, unspecified: Secondary | ICD-10-CM

## 2021-06-12 DIAGNOSIS — Z79899 Other long term (current) drug therapy: Secondary | ICD-10-CM | POA: Insufficient documentation

## 2021-06-12 DIAGNOSIS — N3000 Acute cystitis without hematuria: Secondary | ICD-10-CM | POA: Diagnosis not present

## 2021-06-12 DIAGNOSIS — F419 Anxiety disorder, unspecified: Secondary | ICD-10-CM | POA: Diagnosis present

## 2021-06-12 DIAGNOSIS — R112 Nausea with vomiting, unspecified: Secondary | ICD-10-CM

## 2021-06-12 DIAGNOSIS — R519 Headache, unspecified: Secondary | ICD-10-CM | POA: Diagnosis present

## 2021-06-12 DIAGNOSIS — Z7982 Long term (current) use of aspirin: Secondary | ICD-10-CM | POA: Insufficient documentation

## 2021-06-12 DIAGNOSIS — Z8673 Personal history of transient ischemic attack (TIA), and cerebral infarction without residual deficits: Secondary | ICD-10-CM | POA: Diagnosis not present

## 2021-06-12 DIAGNOSIS — Z7984 Long term (current) use of oral hypoglycemic drugs: Secondary | ICD-10-CM | POA: Diagnosis not present

## 2021-06-12 DIAGNOSIS — Z20822 Contact with and (suspected) exposure to covid-19: Secondary | ICD-10-CM | POA: Insufficient documentation

## 2021-06-12 DIAGNOSIS — R42 Dizziness and giddiness: Secondary | ICD-10-CM

## 2021-06-12 DIAGNOSIS — R1030 Lower abdominal pain, unspecified: Secondary | ICD-10-CM | POA: Diagnosis not present

## 2021-06-12 DIAGNOSIS — I10 Essential (primary) hypertension: Secondary | ICD-10-CM | POA: Diagnosis present

## 2021-06-12 DIAGNOSIS — R269 Unspecified abnormalities of gait and mobility: Secondary | ICD-10-CM

## 2021-06-12 DIAGNOSIS — R531 Weakness: Secondary | ICD-10-CM

## 2021-06-12 LAB — COMPREHENSIVE METABOLIC PANEL
ALT: 42 U/L (ref 0–44)
AST: 30 U/L (ref 15–41)
Albumin: 4 g/dL (ref 3.5–5.0)
Alkaline Phosphatase: 60 U/L (ref 38–126)
Anion gap: 7 (ref 5–15)
BUN: 11 mg/dL (ref 8–23)
CO2: 27 mmol/L (ref 22–32)
Calcium: 8.8 mg/dL — ABNORMAL LOW (ref 8.9–10.3)
Chloride: 104 mmol/L (ref 98–111)
Creatinine, Ser: 1.13 mg/dL (ref 0.61–1.24)
GFR, Estimated: >60 mL/min (ref >60)
Glucose, Bld: 216 mg/dL — ABNORMAL HIGH (ref 70–99)
Potassium: 4.2 mmol/L (ref 3.5–5.1)
Sodium: 138 mmol/L (ref 135–145)
Total Bilirubin: 2.7 mg/dL — ABNORMAL HIGH (ref 0.3–1.2)
Total Protein: 7 g/dL (ref 6.5–8.1)

## 2021-06-12 LAB — CBC WITH DIFFERENTIAL/PLATELET
Abs Immature Granulocytes: 0.04 10*3/uL (ref 0.00–0.07)
Basophils Absolute: 0.1 10*3/uL (ref 0.0–0.1)
Basophils Relative: 1 %
Eosinophils Absolute: 0 10*3/uL (ref 0.0–0.5)
Eosinophils Relative: 0 %
HCT: 46.7 % (ref 39.0–52.0)
Hemoglobin: 15.9 g/dL (ref 13.0–17.0)
Immature Granulocytes: 0 %
Lymphocytes Relative: 11 %
Lymphs Abs: 1.1 10*3/uL (ref 0.7–4.0)
MCH: 29.2 pg (ref 26.0–34.0)
MCHC: 34 g/dL (ref 30.0–36.0)
MCV: 85.8 fL (ref 80.0–100.0)
Monocytes Absolute: 0.5 10*3/uL (ref 0.1–1.0)
Monocytes Relative: 5 %
Neutro Abs: 8.5 10*3/uL — ABNORMAL HIGH (ref 1.7–7.7)
Neutrophils Relative %: 83 %
Platelets: 203 10*3/uL (ref 150–400)
RBC: 5.44 MIL/uL (ref 4.22–5.81)
RDW: 13.9 % (ref 11.5–15.5)
WBC: 10.3 10*3/uL (ref 4.0–10.5)
nRBC: 0 % (ref 0.0–0.2)

## 2021-06-12 LAB — URINALYSIS, ROUTINE W REFLEX MICROSCOPIC
Bacteria, UA: NONE SEEN
Bilirubin Urine: NEGATIVE
Glucose, UA: 150 mg/dL — AB
Hgb urine dipstick: NEGATIVE
Ketones, ur: 5 mg/dL — AB
Nitrite: NEGATIVE
Protein, ur: NEGATIVE mg/dL
Specific Gravity, Urine: 1.019 (ref 1.005–1.030)
pH: 7 (ref 5.0–8.0)

## 2021-06-12 LAB — RESP PANEL BY RT-PCR (FLU A&B, COVID) ARPGX2
Influenza A by PCR: NEGATIVE
Influenza B by PCR: NEGATIVE
SARS Coronavirus 2 by RT PCR: NEGATIVE

## 2021-06-12 LAB — LACTIC ACID, PLASMA: Lactic Acid, Venous: 2 mmol/L (ref 0.5–1.9)

## 2021-06-12 LAB — CBG MONITORING, ED: Glucose-Capillary: 210 mg/dL — ABNORMAL HIGH (ref 70–99)

## 2021-06-12 LAB — GLUCOSE, CAPILLARY: Glucose-Capillary: 192 mg/dL — ABNORMAL HIGH (ref 70–99)

## 2021-06-12 LAB — LIPASE, BLOOD: Lipase: 24 U/L (ref 11–51)

## 2021-06-12 MED ORDER — ACETAMINOPHEN 650 MG RE SUPP: 650.0000 mg | Freq: Four times a day (QID) | RECTAL | Status: DC | PRN

## 2021-06-12 MED ORDER — HYDRALAZINE HCL 20 MG/ML IJ SOLN: 10.0000 mg | Freq: Four times a day (QID) | INTRAMUSCULAR | Status: DC | PRN

## 2021-06-12 MED ORDER — MECLIZINE HCL 12.5 MG PO TABS
25.0000 mg | ORAL_TABLET | Freq: Once | ORAL | Status: AC
  Administered 2021-06-12: 25 mg via ORAL
  Filled 2021-06-12: qty 2

## 2021-06-12 MED ORDER — ACETAMINOPHEN 325 MG PO TABS
650.0000 mg | ORAL_TABLET | Freq: Once | ORAL | Status: AC
  Administered 2021-06-12: 650 mg via ORAL
  Filled 2021-06-12: qty 2

## 2021-06-12 MED ORDER — METOPROLOL SUCCINATE ER 50 MG PO TB24
100.0000 mg | ORAL_TABLET | Freq: Every morning | ORAL | Status: DC
  Administered 2021-06-13: 100 mg via ORAL
  Filled 2021-06-12: qty 2

## 2021-06-12 MED ORDER — AMITRIPTYLINE HCL 25 MG PO TABS
50.0000 mg | ORAL_TABLET | Freq: Every day | ORAL | Status: DC
  Administered 2021-06-12: 50 mg via ORAL
  Filled 2021-06-12: qty 2

## 2021-06-12 MED ORDER — SODIUM CHLORIDE 0.9 % IV BOLUS
500.0000 mL | Freq: Once | INTRAVENOUS | Status: AC
  Administered 2021-06-12: 500 mL via INTRAVENOUS

## 2021-06-12 MED ORDER — AMLODIPINE BESYLATE 5 MG PO TABS
10.0000 mg | ORAL_TABLET | Freq: Once | ORAL | Status: AC
  Administered 2021-06-12: 10 mg via ORAL
  Filled 2021-06-12: qty 2

## 2021-06-12 MED ORDER — EXENATIDE ER 2 MG ~~LOC~~ PEN: 2.0000 mg | PEN_INJECTOR | SUBCUTANEOUS | Status: DC

## 2021-06-12 MED ORDER — IOHEXOL 350 MG/ML SOLN
100.0000 mL | Freq: Once | INTRAVENOUS | Status: AC | PRN
  Administered 2021-06-12: 80 mL via INTRAVENOUS

## 2021-06-12 MED ORDER — INSULIN ASPART 100 UNIT/ML IJ SOLN
0.0000 [IU] | Freq: Three times a day (TID) | INTRAMUSCULAR | Status: DC
  Administered 2021-06-13 (×2): 2 [IU] via SUBCUTANEOUS

## 2021-06-12 MED ORDER — SODIUM CHLORIDE 0.9 % IV SOLN
1.0000 g | INTRAVENOUS | Status: DC
  Administered 2021-06-12: 1 g via INTRAVENOUS
  Filled 2021-06-12: qty 10

## 2021-06-12 MED ORDER — SODIUM CHLORIDE 0.9 % IV SOLN: INTRAVENOUS | Status: DC

## 2021-06-12 MED ORDER — SODIUM CHLORIDE 0.9% FLUSH
3.0000 mL | Freq: Two times a day (BID) | INTRAVENOUS | Status: DC
  Administered 2021-06-12: 3 mL via INTRAVENOUS

## 2021-06-12 MED ORDER — POLYETHYLENE GLYCOL 3350 17 G PO PACK: 17.0000 g | PACK | Freq: Every day | ORAL | Status: DC | PRN

## 2021-06-12 MED ORDER — SIMVASTATIN 20 MG PO TABS: 40.0000 mg | ORAL_TABLET | Freq: Every day | ORAL | Status: DC

## 2021-06-12 MED ORDER — SODIUM CHLORIDE 0.9 % IV SOLN: 250.0000 mL | INTRAVENOUS | Status: DC | PRN

## 2021-06-12 MED ORDER — BISACODYL 10 MG RE SUPP: 10.0000 mg | Freq: Every day | RECTAL | Status: DC | PRN

## 2021-06-12 MED ORDER — ONDANSETRON HCL 4 MG/2ML IJ SOLN: 4.0000 mg | Freq: Four times a day (QID) | INTRAMUSCULAR | Status: DC | PRN

## 2021-06-12 MED ORDER — CYCLOSPORINE 0.05 % OP EMUL
1.0000 [drp] | Freq: Every day | OPHTHALMIC | Status: DC
  Administered 2021-06-13: 1 [drp] via OPHTHALMIC
  Filled 2021-06-12: qty 1

## 2021-06-12 MED ORDER — ADULT MULTIVITAMIN W/MINERALS CH
1.0000 | ORAL_TABLET | Freq: Every day | ORAL | Status: DC
  Administered 2021-06-13: 1 via ORAL
  Filled 2021-06-12: qty 1

## 2021-06-12 MED ORDER — ONDANSETRON HCL 4 MG PO TABS: 4.0000 mg | ORAL_TABLET | Freq: Four times a day (QID) | ORAL | Status: DC | PRN

## 2021-06-12 MED ORDER — INSULIN ASPART 100 UNIT/ML IJ SOLN
0.0000 [IU] | Freq: Every day | INTRAMUSCULAR | Status: DC
Start: 2021-06-12 — End: 2021-06-14

## 2021-06-12 MED ORDER — ASPIRIN EC 81 MG PO TBEC
81.0000 mg | DELAYED_RELEASE_TABLET | Freq: Every day | ORAL | Status: DC
  Administered 2021-06-13: 81 mg via ORAL
  Filled 2021-06-12: qty 1

## 2021-06-12 MED ORDER — SODIUM CHLORIDE 0.9% FLUSH: 3.0000 mL | Freq: Two times a day (BID) | INTRAVENOUS | Status: DC

## 2021-06-12 MED ORDER — ATORVASTATIN CALCIUM 20 MG PO TABS
20.0000 mg | ORAL_TABLET | Freq: Every day | ORAL | Status: DC
  Administered 2021-06-13: 20 mg via ORAL
  Filled 2021-06-12: qty 1

## 2021-06-12 MED ORDER — HEPARIN SODIUM (PORCINE) 5000 UNIT/ML IJ SOLN
5000.0000 [IU] | Freq: Three times a day (TID) | INTRAMUSCULAR | Status: DC
  Administered 2021-06-13 (×2): 5000 [IU] via SUBCUTANEOUS
  Filled 2021-06-12 (×2): qty 1

## 2021-06-12 MED ORDER — TRAZODONE HCL 50 MG PO TABS: 50.0000 mg | ORAL_TABLET | Freq: Every evening | ORAL | Status: DC | PRN

## 2021-06-12 MED ORDER — ACETAMINOPHEN 325 MG PO TABS: 650.0000 mg | ORAL_TABLET | Freq: Four times a day (QID) | ORAL | Status: DC | PRN

## 2021-06-12 MED ORDER — SODIUM CHLORIDE 0.9% FLUSH: 3.0000 mL | INTRAVENOUS | Status: DC | PRN

## 2021-06-12 MED ORDER — ONDANSETRON HCL 4 MG/2ML IJ SOLN
4.0000 mg | Freq: Once | INTRAMUSCULAR | Status: AC
  Administered 2021-06-12: 4 mg via INTRAVENOUS
  Filled 2021-06-12: qty 2

## 2021-06-12 MED ORDER — AMLODIPINE BESYLATE 5 MG PO TABS: 10.0000 mg | ORAL_TABLET | Freq: Every day | ORAL | Status: DC

## 2021-06-12 NOTE — ED Notes (Signed)
Pt placed on cardiac monitor with BP to set cycle every 30 minutes. Continuous pulse oximeter applied.  

## 2021-06-12 NOTE — H&P (Signed)
Patient Demographics:    [name] [name], is a 70 y.o. male  MRN: [id number]   DOB - August 01, 1951  Admit Date - 06/12/2021  Outpatient Primary MD for the patient is Adaline Sill, NP   Assessment & Plan:    Principal Problem:   UTI (urinary tract infection) Active Problems:   Essential hypertension   Diabetes ([place])   Abnormality of gait   Generalized weakness   Anxiety and depression   1)Presumed UTI--- IV Rocephin pending culture data -No leukocytosis, -Does not meet sepsis criteria -Lactic acid 2.0 due to dehydration in the setting of nausea vomiting or diarrhea, give IV fluids  2) generalized weakness, unsteady gait and dizziness--- CT head without acute findings -MRI brain to rule out posterior circulation CVA pending PT eval pending -Continue aspirin and simvastatin  3) CT finding of Mild left hydronephrosis and ureterectasis without obstructing Calculus--- monitor urine output closely consider urology follow-up as outpatient if becomes symptomatic  4)DM2-history of uncontrolled DM with hyperglycemia with A1c previously greater than 10 -Recheck A1c, hold metformin and Jardiance, Use Novolog/Humalog Sliding scale insulin with Accu-Cheks/Fingersticks as ordered   5)Stage 2 Hypertension uncontrolled--- hold lisinopril HCTZ and hold Lasix due to nausea vomiting and diarrhea with risk for AKI -Continue metoprolol -Continue amlodipine, may use iv hydralazine as needed elevated BP  6)Nausea, vomiting diarrhea and abdominal pain--- this to be improving, CT abdomen and pelvis without acute GI findings, lipase is not elevated -Symptoms may be related to UTI  7)Small intraparotid lymph node or a primary parotid neoplasm,  -nonemergent ultrasound follow-up is suggested for  further evaluation.  Disposition/Need for in-Hospital Stay- patient unable to be discharged at this time due to UTI requiring IV antibiotics, nausea vomiting and diarrhea requiring IV fluids until better tolerance of oral intake -Unsteady requiring PT eval and MRI brain to rule out posterior circulation CVA*  Dispo: The patient is from: Home              Anticipated d/c is to: Home              Anticipated d/c date is: 1 day              Patient currently is not medically stable to d/c. 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 male with past medical history relevant for diabetes, HTN, anxiety and depression as well as history of prior TIA and GERD who presents to the ED with nausea vomiting and diarrhea for 24 hours duration associated with generalized weakness and unsteady gait and fear of falling -Emesis was without bile or blood -Stools loose but without blood or mucus -Lipase is not elevated, WBC is normal, creatinine is 1.1 -UA suspicious for UTI CT abdomen and pelvis without acute findings however does show Mild left hydronephrosis and ureterectasis without obstructing calculus. -CT head without contrast shows no acute findings however there is small intraparotid lymph node or a primary parotid neoplasm, nonemergent ultrasound follow-up is suggested for  further evaluation. No fever  Or chills , no chest pains no palpitations, no shortness of breath or dyspnea on exertion denies head injury   Review of systems:    In addition to the HPI above,   A full Review of  Systems was done, all other systems reviewed are negative except as noted above in HPI , .    Social History:  Reviewed by me    Social History   Tobacco Use   Smoking status: Never   Smokeless tobacco: Never  Substance Use Topics   Alcohol use: Not Currently    Comment: rarely       Family History :  Reviewed by me    Family History  Problem Relation Age of Onset   Sudden death Sister    Dementia Other    Alzheimer's disease Other    Diabetes Other    Coronary artery disease Other    Heart attack Other    Cancer Other    Lung cancer Other    Cancer Mother        lung   Diabetes Mother    Cancer Father    Diabetes Father    Colon cancer Neg Hx      Home Medications:   Prior to Admission medications   Medication Sig Start Date End Date Taking? Authorizing Provider  amitriptyline (ELAVIL) 50 MG tablet TAKE ONE TABLET BY MOUTH AT BEDTIME 01/18/17  Yes Dettinger, Fransisca Kaufmann, MD  amLODipine (NORVASC) 5 MG tablet TAKE ONE TABLET BY MOUTH DAILY. (,EVENING) Patient taking differently: Take 5 mg by mouth daily. 02/14/17  Yes Dettinger, Fransisca Kaufmann, MD  aspirin 325 MG tablet Take 1 tablet (325 mg total) by mouth daily. 12/03/14  Yes Isaac Bliss, Rayford Halsted, MD  BYDUREON 2 MG PEN INJECT TWO MG SUBCUTANEOUSLY ONCE A WEEK Patient taking differently: Inject 2 mg into the skin once a week. 03/06/17  Yes Dettinger, Fransisca Kaufmann, MD  furosemide (LASIX) 20 MG tablet TAKE ONE TABLET BY MOUTH IN THE MORNING FOR BLOOD PRESSURE AND FLUID (MORNING) Patient taking differently: Take 20 mg by mouth daily. 01/18/17  Yes Dettinger, Fransisca Kaufmann, MD  HYDROcodone-acetaminophen (NORCO/VICODIN) 5-325 MG tablet Take 1 tablet by mouth daily as needed for severe pain. Reported on 05/08/2016 12/22/16   Yes Dettinger, Fransisca Kaufmann, MD  JARDIANCE 25 MG TABS tablet Take 25 mg by mouth daily. 06/02/21  Yes [provider]  lisinopril (PRINIVIL,ZESTRIL) 20 MG tablet TAKE ONE TABLET BY MOUTH IN THE MORNING FOR BLOOD PRESSURE Patient taking differently: Take 20 mg by mouth daily. 01/18/17  Yes Dettinger, Vonna Kotyk  A, MD  metFORMIN (GLUCOPHAGE) 1000 MG tablet TAKE ONE TABLET BY MOUTH TWICE DAILY WITH A MEAL. (MORNING ,EVENING) Patient taking differently: Take 1,000 mg by mouth 2 (two) times daily with a meal. 03/14/17  Yes Dettinger, Fransisca Kaufmann, MD  miconazole (MICATIN) 2 % cream Apply 1 application topically 2 (two) times daily. 7 days 01/17/17  Yes Dettinger, Fransisca Kaufmann, MD  nitroGLYCERIN (NITROSTAT) 0.4 MG SL tablet Place 0.4 mg under the tongue every 5 (five) minutes as needed for chest pain.   Yes [provider]  potassium chloride (K-DUR,KLOR-CON) 10 MEQ tablet TAKE ONE TABLET BY MOUTH TWICE DAILY (MORNING ,EVENING) Patient taking differently: Take 10 mEq by mouth once. 01/18/17  Yes Dettinger, Fransisca Kaufmann, MD  RESTASIS MULTIDOSE 0.05 % ophthalmic emulsion Place 1 drop into both eyes daily. 11/16/16  Yes [provider]  triamcinolone cream (KENALOG) 0.1 % Apply 1 application topically 2 (two) times daily.   Yes [provider]  amLODipine (NORVASC) 10 MG tablet Take 10 mg by mouth daily. Patient not taking: No sig reported 06/02/21   [provider]  bacitracin ointment Apply to scalp laceration twice daily until healed. Patient not taking: No sig reported 08/09/20   [provider]  DULoxetine (CYMBALTA) 30 MG capsule Take 1 capsule (30 mg total) by mouth daily. Patient not taking: No sig reported 12/22/16   Dettinger, Fransisca Kaufmann, MD  HYDROcodone-acetaminophen (NORCO/VICODIN) 5-325 MG tablet Take 1 tablet by mouth daily as needed for moderate pain. Do not refill until 30 days from prescription date Patient not taking: No sig reported 12/22/16   Dettinger, Fransisca Kaufmann, MD   HYDROcodone-acetaminophen (NORCO/VICODIN) 5-325 MG tablet Take 1 tablet by mouth daily as needed for moderate pain. Do not refill until 60 days from prescription Patient not taking: No sig reported 12/22/16   Dettinger, Fransisca Kaufmann, MD  metoprolol succinate (TOPROL-XL) 100 MG 24 hr tablet TAKE ONE TABLET BY MOUTH IN THE MORNING Patient not taking: No sig reported 01/18/17   Dettinger, Fransisca Kaufmann, MD  simvastatin (ZOCOR) 20 MG tablet SMARTSIG:1 Tablet(s) By Mouth Every Evening Patient not taking: No sig reported 06/02/21   [provider]     Allergies:    No Known Allergies   Physical Exam:   Vitals  Blood pressure (!) 193/98, pulse 62, temperature 98.4 F (36.9 C), temperature source Oral, resp. rate 20, height '5\' 5"'$  (1.651 m), weight 90 kg, SpO2 99 %.  Physical Examination: General appearance - alert,  , and in no distress  Mental status - alert, oriented to person, place, and time,  Eyes - sclera anicteric Neck - supple, no JVD elevation , Chest - clear  to auscultation bilaterally, symmetrical air movement,  Heart - S1 and S2 normal, regular  Abdomen - soft, nontender, nondistended, no masses or organomegaly Neurological - screening mental status exam normal, neck supple without rigidity, cranial nerves II through XII intact, very unsteady gait, no new additional focal neurodeficits per se Extremities - no pedal edema noted, intact peripheral pulses  Skin - warm, dry    Data Review:    CBC Recent Labs  Lab 06/12/21 1310  WBC 10.3  HGB 15.9  HCT 46.7  PLT 203  MCV 85.8  MCH 29.2  MCHC 34.0  RDW 13.9  LYMPHSABS 1.1  MONOABS 0.5  EOSABS 0.0  BASOSABS 0.1   ------------------------------------------------------------------------------------------------------------------  Chemistries  Recent Labs  Lab 06/12/21 1310  NA 138  K 4.2  CL 104  CO2 27  GLUCOSE 216*  BUN 11  CREATININE 1.13  CALCIUM 8.8*  AST 30  ALT 42  ALKPHOS 60  BILITOT 2.7*    ------------------------------------------------------------------------------------------------------------------ estimated creatinine clearance is 64.5 mL/min (by C-G formula based on SCr of 1.13 mg/dL). ------------------------------------------------------------------------------------------------------------------ No results for input(s): TSH, T4TOTAL, T3FREE, THYROIDAB in the last 72 hours.  Invalid input(s): FREET3   Coagulation profile No results for input(s): INR, PROTIME in the last 168 hours. ------------------------------------------------------------------------------------------------------------------- No results for input(s): DDIMER in the last 72 hours. -------------------------------------------------------------------------------------------------------------------  Cardiac Enzymes No results for input(s): CKMB, TROPONINI, MYOGLOBIN in the last 168 hours.  Invalid input(s): CK ------------------------------------------------------------------------------------------------------------------ No results found for: BNP   ---------------------------------------------------------------------------------------------------------------  Urinalysis    Component Value Date/Time   COLORURINE YELLOW 06/12/2021 [place] 06/12/2021 1433   LABSPEC 1.019 06/12/2021 1433   PHURINE 7.0 06/12/2021 1433   GLUCOSEU 150 (A) 06/12/2021 1433   HGBUR NEGATIVE 06/12/2021 1433   BILIRUBINUR NEGATIVE 06/12/2021 1433   KETONESUR 5 (A) 06/12/2021 1433   PROTEINUR NEGATIVE 06/12/2021 1433   UROBILINOGEN 0.2 05/03/2015 1339   NITRITE NEGATIVE 06/12/2021 1433   LEUKOCYTESUR MODERATE (A) 06/12/2021 1433    ----------------------------------------------------------------------------------------------------------------   Imaging Results:    CT HEAD WO CONTRAST (5MM)  Result Date: 06/12/2021 CLINICAL DATA:  Dizziness and headache with onset at 8:30 a.m. No history of  head injury in a 70 year old male. EXAM: CT HEAD WITHOUT CONTRAST TECHNIQUE: Contiguous axial images were obtained from the base of the skull through the vertex without intravenous contrast. COMPARISON:  Prior studies from October of 2021 in July of 2016. FINDINGS: Brain: No evidence of acute infarction, hemorrhage, hydrocephalus, extra-axial collection or mass lesion/mass effect. Signs of atrophy and chronic microvascular ischemic change as before. Vascular: No hyperdense vessel or unexpected calcification. Skull: Normal. Negative for fracture or focal lesion. Sinuses/Orbits: Visualized paranasal sinuses and orbits are unremarkable. Other: Small LEFT parotid lesion measuring approximately 9 mm with respect imaged portions (incompletely imaged on the current study) is of uncertain significance not seen on prior imaging. IMPRESSION: 1. No acute intracranial abnormality. 2. Signs of atrophy and chronic microvascular ischemic change as before. 3. Small LEFT parotid lesion measuring approximately 9 mm with respect imaged portions (incompletely imaged on the current study) is of uncertain significance not seen on prior imaging. This may represent a small intraparotid lymph node or a primary parotid neoplasm, nonemergent ultrasound follow-up is suggested for further evaluation. Electronically Signed   By: Zetta Bills M.D.   On: 06/12/2021 13:40   CT ABDOMEN PELVIS W CONTRAST  Result Date: 06/12/2021 CLINICAL DATA:  Lower abdominal pain, distension EXAM: CT ABDOMEN AND PELVIS WITH CONTRAST TECHNIQUE: Multidetector CT imaging of the abdomen and pelvis was performed using the standard protocol following bolus administration of intravenous contrast. CONTRAST:  26m OMNIPAQUE IOHEXOL 350 MG/ML SOLN COMPARISON:  03/16/2010 FINDINGS: Lower chest: No acute abnormality. Hepatobiliary: No focal liver abnormality is seen. No gallstones, gallbladder wall thickening, or biliary dilatation. Pancreas: Unremarkable. No pancreatic  ductal dilatation or surrounding inflammatory changes. Spleen: Normal in size without focal abnormality. Adrenals/Urinary Tract: Adrenal glands unremarkable. Bilateral renal cystic lesions, largest 4.3 mid right present since prior study. 2 mm nonobstructive left upper pole calculus. 1.8 cm exophytic nonspecific lesion from the lower pole left kidney, new since previous. There is mild left hydronephrosis and ureterectasis without radiodense calculus. The urinary bladder is mildly distended with smooth mild wall thickening. Stomach/Bowel: Stomach incompletely distended, unremarkable. Small bowel decompressed. Appendix not discretely identified. The colon is  nondilated, unremarkable. Vascular/Lymphatic: Minimal calcified aortic plaque. No abdominal or pelvic adenopathy. Reproductive: Prostate enlargement. Other: Left pelvic phleboliths.  No ascites.  No free air. Musculoskeletal: Umbilical hernia repair sutures. Bilateral inguinal hernias containing only fat. No fracture or worrisome bone lesion. IMPRESSION: 1. Nonobstructive upper pole left urolithiasis. 2. Mild left hydronephrosis and ureterectasis without obstructing calculus. Electronically Signed   By: Lucrezia Europe M.D.   On: 06/12/2021 14:28    Radiological Exams on Admission: CT HEAD WO CONTRAST (5MM)  Result Date: 06/12/2021 CLINICAL DATA:  Dizziness and headache with onset at 8:30 a.m. No history of head injury in a 70 year old male. EXAM: CT HEAD WITHOUT CONTRAST TECHNIQUE: Contiguous axial images were obtained from the base of the skull through the vertex without intravenous contrast. COMPARISON:  Prior studies from October of 2021 in July of 2016. FINDINGS: Brain: No evidence of acute infarction, hemorrhage, hydrocephalus, extra-axial collection or mass lesion/mass effect. Signs of atrophy and chronic microvascular ischemic change as before. Vascular: No hyperdense vessel or unexpected calcification. Skull: Normal. Negative for fracture or focal lesion.  Sinuses/Orbits: Visualized paranasal sinuses and orbits are unremarkable. Other: Small LEFT parotid lesion measuring approximately 9 mm with respect imaged portions (incompletely imaged on the current study) is of uncertain significance not seen on prior imaging. IMPRESSION: 1. No acute intracranial abnormality. 2. Signs of atrophy and chronic microvascular ischemic change as before. 3. Small LEFT parotid lesion measuring approximately 9 mm with respect imaged portions (incompletely imaged on the current study) is of uncertain significance not seen on prior imaging. This may represent a small intraparotid lymph node or a primary parotid neoplasm, nonemergent ultrasound follow-up is suggested for further evaluation. Electronically Signed   By: Zetta Bills M.D.   On: 06/12/2021 13:40   CT ABDOMEN PELVIS W CONTRAST  Result Date: 06/12/2021 CLINICAL DATA:  Lower abdominal pain, distension EXAM: CT ABDOMEN AND PELVIS WITH CONTRAST TECHNIQUE: Multidetector CT imaging of the abdomen and pelvis was performed using the standard protocol following bolus administration of intravenous contrast. CONTRAST:  10m OMNIPAQUE IOHEXOL 350 MG/ML SOLN COMPARISON:  03/16/2010 FINDINGS: Lower chest: No acute abnormality. Hepatobiliary: No focal liver abnormality is seen. No gallstones, gallbladder wall thickening, or biliary dilatation. Pancreas: Unremarkable. No pancreatic ductal dilatation or surrounding inflammatory changes. Spleen: Normal in size without focal abnormality. Adrenals/Urinary Tract: Adrenal glands unremarkable. Bilateral renal cystic lesions, largest 4.3 mid right present since prior study. 2 mm nonobstructive left upper pole calculus. 1.8 cm exophytic nonspecific lesion from the lower pole left kidney, new since previous. There is mild left hydronephrosis and ureterectasis without radiodense calculus. The urinary bladder is mildly distended with smooth mild wall thickening. Stomach/Bowel: Stomach incompletely  distended, unremarkable. Small bowel decompressed. Appendix not discretely identified. The colon is nondilated, unremarkable. Vascular/Lymphatic: Minimal calcified aortic plaque. No abdominal or pelvic adenopathy. Reproductive: Prostate enlargement. Other: Left pelvic phleboliths.  No ascites.  No free air. Musculoskeletal: Umbilical hernia repair sutures. Bilateral inguinal hernias containing only fat. No fracture or worrisome bone lesion. IMPRESSION: 1. Nonobstructive upper pole left urolithiasis. 2. Mild left hydronephrosis and ureterectasis without obstructing calculus. Electronically Signed   By: DLucrezia EuropeM.D.   On: 06/12/2021 14:28    DVT Prophylaxis -SCD /heparin AM Labs Ordered, also please review Full Orders  Family Communication: Admission, patients condition and plan of care including tests being ordered have been discussed with the patient who indicate understanding and agree with the plan   Code Status - Full Code  Likely DC to  home  Condition   stable  Roxan Hockey M.D on 06/12/2021 at 10:02 PM Go to [email or web address] -  for contact info  [facility] - Office  [phone]

## 2021-06-12 NOTE — ED Notes (Signed)
Patient transported to CT 

## 2021-06-12 NOTE — ED Notes (Signed)
Called Acute Care to give report, receiving nurse, Larene Beach, will have to call ED back per Joy.

## 2021-06-12 NOTE — ED Provider Notes (Signed)
[facility] EMERGENCY DEPARTMENT Provider Note   CSN: [id number] Arrival date & time: 06/12/21  1224     History Chief Complaint  Patient presents with   Abdominal Pain   Dizziness    [name] [name] is a 70 y.o. male with a history of insulin-dependent diabetes, GERD, hypertension, history of tia in 2016 (facial droop with negative MRI) presenting for evaluation of multiple complaints.  He was making his breakfast around 8 AM today when he developed a gradual headache which at this point states is severe, shortly after this he started having dizziness, describing room spinning sensation with any positional changes, in association he has had six episodes of vomiting with persistent nausea, 1 episode of diarrhea and periumbilical abdominal pain.  Since arrival he also notes shaking chills, he he has had no obvious fever symptoms prior to arrival.  He was able to eat some of his breakfast, and took his morning medications but probably did not keep them down as he promptly vomited.  He denies focal weakness in his arms or legs, no numbness.       The history is provided by the patient.      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   Borderline diabetes mellitus    Depression with anxiety    Diabetes mellitus without complication (HCC)    GERD (gastroesophageal reflux disease)    HOH (hard of hearing)    Hypertension    Obstructive sleep apnea on CPAP    Stroke [facility])     Patient Active Problem List   Diagnosis Date Noted   UTI (urinary tract infection) 06/12/2021   Suicidal ideation    Anxiety and depression 06/07/2016   Bilateral leg weakness    Generalized weakness 05/03/2015   Acute encephalopathy 05/03/2015   Aphasia    Monocular diplopia 12/02/2014   Abnormality of gait 12/02/2014   TIA (transient ischemic attack) 12/01/2014   Essential hypertension 03/15/2010   GERD 03/15/2010   Diabetes ([place]) 03/15/2010     Past Surgical History:  Procedure Laterality Date   RECTAL SURGERY     As an infant   TONSILLECTOMY     UMBILICAL HERNIA REPAIR         Family History  Problem Relation Age of Onset   Sudden death Sister    Dementia Other    Alzheimer's disease Other    Diabetes Other    Coronary artery disease Other    Heart attack Other    Cancer Other    Lung cancer Other    Cancer Mother        lung   Diabetes Mother    Cancer Father    Diabetes Father    Colon cancer Neg Hx     Social History   Tobacco Use   Smoking status: Never   Smokeless tobacco: Never  Vaping Use   Vaping Use: Never used  Substance Use Topics   Alcohol use: Not Currently    Comment: rarely   Drug use: No    Home Medications Prior to Admission medications   Medication Sig Start Date End Date Taking? Authorizing Provider  amitriptyline (ELAVIL) 50 MG tablet TAKE ONE TABLET BY MOUTH AT BEDTIME 01/18/17  Yes Dettinger, Fransisca Kaufmann, MD  amLODipine (NORVASC) 5 MG tablet TAKE ONE TABLET BY MOUTH DAILY. (,EVENING) Patient taking differently: Take 5 mg by mouth daily. 02/14/17  Yes  Dettinger, Fransisca Kaufmann, MD  aspirin 325 MG tablet Take 1 tablet (325 mg total) by mouth daily. 12/03/14  Yes Isaac Bliss, Rayford Halsted, MD  BYDUREON 2 MG PEN INJECT TWO MG SUBCUTANEOUSLY ONCE A WEEK Patient taking differently: Inject 2 mg into the skin once a week. 03/06/17  Yes Dettinger, Fransisca Kaufmann, MD  furosemide (LASIX) 20 MG tablet TAKE ONE TABLET BY MOUTH IN THE MORNING FOR BLOOD PRESSURE AND FLUID (MORNING) Patient taking differently: Take 20 mg by mouth daily. 01/18/17  Yes Dettinger, Fransisca Kaufmann, MD  HYDROcodone-acetaminophen (NORCO/VICODIN) 5-325 MG tablet Take 1 tablet by mouth daily as needed for severe pain. Reported on 05/08/2016 12/22/16  Yes Dettinger, Fransisca Kaufmann, MD  JARDIANCE 25 MG TABS tablet Take 25 mg by mouth daily. 06/02/21  Yes [provider]  lisinopril (PRINIVIL,ZESTRIL) 20 MG tablet TAKE ONE TABLET BY MOUTH IN  THE MORNING FOR BLOOD PRESSURE Patient taking differently: Take 20 mg by mouth daily. 01/18/17  Yes Dettinger, Fransisca Kaufmann, MD  metFORMIN (GLUCOPHAGE) 1000 MG tablet TAKE ONE TABLET BY MOUTH TWICE DAILY WITH A MEAL. (MORNING ,EVENING) Patient taking differently: Take 1,000 mg by mouth 2 (two) times daily with a meal. 03/14/17  Yes Dettinger, Fransisca Kaufmann, MD  miconazole (MICATIN) 2 % cream Apply 1 application topically 2 (two) times daily. 7 days 01/17/17  Yes Dettinger, Fransisca Kaufmann, MD  nitroGLYCERIN (NITROSTAT) 0.4 MG SL tablet Place 0.4 mg under the tongue every 5 (five) minutes as needed for chest pain.   Yes [provider]  potassium chloride (K-DUR,KLOR-CON) 10 MEQ tablet TAKE ONE TABLET BY MOUTH TWICE DAILY (MORNING ,EVENING) Patient taking differently: Take 10 mEq by mouth once. 01/18/17  Yes Dettinger, Fransisca Kaufmann, MD  RESTASIS MULTIDOSE 0.05 % ophthalmic emulsion Place 1 drop into both eyes daily. 11/16/16  Yes [provider]  triamcinolone cream (KENALOG) 0.1 % Apply 1 application topically 2 (two) times daily.   Yes [provider]  amLODipine (NORVASC) 10 MG tablet Take 10 mg by mouth daily. Patient not taking: No sig reported 06/02/21   [provider]  bacitracin ointment Apply to scalp laceration twice daily until healed. Patient not taking: No sig reported 08/09/20   [provider]  DULoxetine (CYMBALTA) 30 MG capsule Take 1 capsule (30 mg total) by mouth daily. Patient not taking: No sig reported 12/22/16   Dettinger, Fransisca Kaufmann, MD  HYDROcodone-acetaminophen (NORCO/VICODIN) 5-325 MG tablet Take 1 tablet by mouth daily as needed for moderate pain. Do not refill until 30 days from prescription date Patient not taking: No sig reported 12/22/16   Dettinger, Fransisca Kaufmann, MD  HYDROcodone-acetaminophen (NORCO/VICODIN) 5-325 MG tablet Take 1 tablet by mouth daily as needed for moderate pain. Do not refill until 60 days from prescription Patient not taking: No sig  reported 12/22/16   Dettinger, Fransisca Kaufmann, MD  metoprolol succinate (TOPROL-XL) 100 MG 24 hr tablet TAKE ONE TABLET BY MOUTH IN THE MORNING Patient not taking: No sig reported 01/18/17   Dettinger, Fransisca Kaufmann, MD  simvastatin (ZOCOR) 20 MG tablet SMARTSIG:1 Tablet(s) By Mouth Every Evening Patient not taking: No sig reported 06/02/21   [provider]    Allergies    Patient has no known allergies.  Review of Systems   Review of Systems  Constitutional:  Positive for chills.  HENT: Negative.         Reports chronic intermittent tinnitus right ear  Respiratory: Negative.    Gastrointestinal:  Positive for abdominal pain, diarrhea, nausea  and vomiting.  Neurological:  Positive for dizziness, weakness and headaches.  All other systems reviewed and are negative.  Physical Exam Updated Vital Signs BP (!) 175/80   Pulse 60   Temp 97.8 F (36.6 C) (Rectal)   Resp 20   Ht '5\' 5"'$  (1.651 m)   Wt 90 kg   SpO2 100%   BMI 33.02 kg/m   Physical Exam Vitals and nursing note reviewed.  Constitutional:      Appearance: He is well-developed.  HENT:     Head: Normocephalic and atraumatic.  Eyes:     Extraocular Movements: Extraocular movements intact.     Right eye: No nystagmus.     Conjunctiva/sclera: Conjunctivae normal.     Pupils: Pupils are equal, round, and reactive to light.     Comments: Left eye blind since birth  Cardiovascular:     Rate and Rhythm: Normal rate and regular rhythm.     Heart sounds: Normal heart sounds.  Pulmonary:     Effort: Pulmonary effort is normal.     Breath sounds: Normal breath sounds. No wheezing.  Abdominal:     General: Bowel sounds are normal. There is distension.     Palpations: Abdomen is soft.     Tenderness: There is abdominal tenderness in the periumbilical area. There is guarding.     Comments: Increased tympany all quadrants.  Periumbilical guarding appreciated.  Musculoskeletal:        General: Normal range of motion.     Cervical  back: Normal range of motion.  Skin:    General: Skin is warm and dry.  Neurological:     Mental Status: He is alert.     Cranial Nerves: Cranial nerves are intact. No facial asymmetry.     Sensory: Sensation is intact. No sensory deficit.     Motor: Weakness present.     Comments: Generalized weakness - 4/5 bilateral grip strength.No foot drop, pt unwilling to try heel shin.  No pronator drift.    ED Results / Procedures / Treatments   Labs (all labs ordered are listed, but only abnormal results are displayed) Labs Reviewed  CBC WITH DIFFERENTIAL/PLATELET - Abnormal; Notable for the following components:      Result Value   Neutro Abs 8.5 (*)    All other components within normal limits  COMPREHENSIVE METABOLIC PANEL - Abnormal; Notable for the following components:   Glucose, Bld 216 (*)    Calcium 8.8 (*)    Total Bilirubin 2.7 (*)    All other components within normal limits  URINALYSIS, ROUTINE W REFLEX MICROSCOPIC - Abnormal; Notable for the following components:   Glucose, UA 150 (*)    Ketones, ur 5 (*)    Leukocytes,Ua MODERATE (*)    All other components within normal limits  LACTIC ACID, PLASMA - Abnormal; Notable for the following components:   Lactic Acid, Venous 2.0 (*)    All other components within normal limits  CBG MONITORING, ED - Abnormal; Notable for the following components:   Glucose-Capillary 210 (*)    All other components within normal limits  RESP PANEL BY RT-PCR (FLU A&B, COVID) ARPGX2  LIPASE, BLOOD    EKG None  Radiology CT HEAD WO CONTRAST (5MM)  Result Date: 06/12/2021 CLINICAL DATA:  Dizziness and headache with onset at 8:30 a.m. No history of head injury in a 70 year old male. EXAM: CT HEAD WITHOUT CONTRAST TECHNIQUE: Contiguous axial images were obtained from the base of the skull through the vertex  without intravenous contrast. COMPARISON:  Prior studies from October of 2021 in July of 2016. FINDINGS: Brain: No evidence of acute  infarction, hemorrhage, hydrocephalus, extra-axial collection or mass lesion/mass effect. Signs of atrophy and chronic microvascular ischemic change as before. Vascular: No hyperdense vessel or unexpected calcification. Skull: Normal. Negative for fracture or focal lesion. Sinuses/Orbits: Visualized paranasal sinuses and orbits are unremarkable. Other: Small LEFT parotid lesion measuring approximately 9 mm with respect imaged portions (incompletely imaged on the current study) is of uncertain significance not seen on prior imaging. IMPRESSION: 1. No acute intracranial abnormality. 2. Signs of atrophy and chronic microvascular ischemic change as before. 3. Small LEFT parotid lesion measuring approximately 9 mm with respect imaged portions (incompletely imaged on the current study) is of uncertain significance not seen on prior imaging. This may represent a small intraparotid lymph node or a primary parotid neoplasm, nonemergent ultrasound follow-up is suggested for further evaluation. Electronically Signed   By: Zetta Bills M.D.   On: 06/12/2021 13:40   CT ABDOMEN PELVIS W CONTRAST  Result Date: 06/12/2021 CLINICAL DATA:  Lower abdominal pain, distension EXAM: CT ABDOMEN AND PELVIS WITH CONTRAST TECHNIQUE: Multidetector CT imaging of the abdomen and pelvis was performed using the standard protocol following bolus administration of intravenous contrast. CONTRAST:  27m OMNIPAQUE IOHEXOL 350 MG/ML SOLN COMPARISON:  03/16/2010 FINDINGS: Lower chest: No acute abnormality. Hepatobiliary: No focal liver abnormality is seen. No gallstones, gallbladder wall thickening, or biliary dilatation. Pancreas: Unremarkable. No pancreatic ductal dilatation or surrounding inflammatory changes. Spleen: Normal in size without focal abnormality. Adrenals/Urinary Tract: Adrenal glands unremarkable. Bilateral renal cystic lesions, largest 4.3 mid right present since prior study. 2 mm nonobstructive left upper pole calculus. 1.8 cm  exophytic nonspecific lesion from the lower pole left kidney, new since previous. There is mild left hydronephrosis and ureterectasis without radiodense calculus. The urinary bladder is mildly distended with smooth mild wall thickening. Stomach/Bowel: Stomach incompletely distended, unremarkable. Small bowel decompressed. Appendix not discretely identified. The colon is nondilated, unremarkable. Vascular/Lymphatic: Minimal calcified aortic plaque. No abdominal or pelvic adenopathy. Reproductive: Prostate enlargement. Other: Left pelvic phleboliths.  No ascites.  No free air. Musculoskeletal: Umbilical hernia repair sutures. Bilateral inguinal hernias containing only fat. No fracture or worrisome bone lesion. IMPRESSION: 1. Nonobstructive upper pole left urolithiasis. 2. Mild left hydronephrosis and ureterectasis without obstructing calculus. Electronically Signed   By: DLucrezia EuropeM.D.   On: 06/12/2021 14:28    Procedures Procedures   Medications Ordered in ED Medications  sodium chloride 0.9 % bolus 500 mL (0 mLs Intravenous Stopped 06/12/21 1536)  meclizine (ANTIVERT) tablet 25 mg (25 mg Oral Given 06/12/21 1423)  acetaminophen (TYLENOL) tablet 650 mg (650 mg Oral Given 06/12/21 1423)  iohexol (OMNIPAQUE) 350 MG/ML injection 100 mL (80 mLs Intravenous Contrast Given 06/12/21 1417)  ondansetron (ZOFRAN) injection 4 mg (4 mg Intravenous Given 06/12/21 1605)  sodium chloride 0.9 % bolus 500 mL (0 mLs Intravenous Stopped 06/12/21 1728)    ED Course  I have reviewed the triage vital signs and the nursing notes.  Pertinent labs & imaging results that were available during my care of the patient were reviewed by me and considered in my medical decision making (see chart for details).    MDM Rules/Calculators/A&P                           7:32 PM Patient with multiple symptoms including headache, vertigo, abdominal pain, nausea vomiting and diarrhea.  He was given IV fluids, oral meclizine and Zofran.   He reports he still has dizziness but somewhat improved during his stay here.  He has had no further vomiting, he does endorse mild nausea at this time, no diarrhea here.  Headache is better.  He continues to endorse significant generalized weakness, vertigo still present but better.    Attempt to ambulate with assistance after IV fluids given, pt is very unsteady on his feet, stumbled, required assistance with ambulation  Re-exam - no pronator drift, no focal neuro deficit, dizziness is improved but not resolved.  He was given meclizine, given his response to this medication I suspect that this is probably a peripheral source of his vertigo, though he has significant risk factors for CVA.  Given his difficulty with ambulation, he would benefit from admission for #1 MRI in the morning to confirm peripheral source of his vertigo but also he may benefit from a PT/OT evaluation to ensure safety at home.   Discussed with Dr. Denton Brick who accepts pt for admission.   Final diagnoses:  Parotid nodule  Dizziness  Acute nonintractable headache, unspecified headache type  Nausea, vomiting and diarrhea    Rx / DC Orders ED Discharge Orders     None        Landis Martins 06/12/21 1933    Kommor, Debe Coder, MD 06/13/21 [id number]

## 2021-06-12 NOTE — ED Triage Notes (Signed)
Patient presents via EMS for abdominal pain, nausea, vomiting X6 in 24 hours, 1 episode of diarrhea with dizziness. Patient states the dizziness came on when he was standing up and cooking breakfast earlier this morning.

## 2021-06-12 NOTE — ED Notes (Signed)
Patient states when he walks at home, he has to hold onto objects to help him move about the house. Patient walked approximately 20 feet with 2-person assist on both sides and had a momentarily weakness in left foot and stumbled. Patient remains unsteady on his feet and states continued weakness. Almyra Free idol made aware and new orders placed.

## 2021-06-13 ENCOUNTER — Observation Stay (HOSPITAL_COMMUNITY): Payer: Medicare Other

## 2021-06-13 DIAGNOSIS — R42 Dizziness and giddiness: Secondary | ICD-10-CM | POA: Diagnosis not present

## 2021-06-13 DIAGNOSIS — N3000 Acute cystitis without hematuria: Secondary | ICD-10-CM | POA: Diagnosis not present

## 2021-06-13 DIAGNOSIS — F419 Anxiety disorder, unspecified: Secondary | ICD-10-CM | POA: Diagnosis not present

## 2021-06-13 DIAGNOSIS — R269 Unspecified abnormalities of gait and mobility: Secondary | ICD-10-CM | POA: Diagnosis not present

## 2021-06-13 DIAGNOSIS — N390 Urinary tract infection, site not specified: Secondary | ICD-10-CM | POA: Diagnosis not present

## 2021-06-13 DIAGNOSIS — K118 Other diseases of salivary glands: Secondary | ICD-10-CM

## 2021-06-13 LAB — CBC
HCT: 45 % (ref 39.0–52.0)
Hemoglobin: 15.1 g/dL (ref 13.0–17.0)
MCH: 29.2 pg (ref 26.0–34.0)
MCHC: 33.6 g/dL (ref 30.0–36.0)
MCV: 87 fL (ref 80.0–100.0)
Platelets: 200 10*3/uL (ref 150–400)
RBC: 5.17 MIL/uL (ref 4.22–5.81)
RDW: 14.2 % (ref 11.5–15.5)
WBC: 7.6 10*3/uL (ref 4.0–10.5)
nRBC: 0 % (ref 0.0–0.2)

## 2021-06-13 LAB — HIV ANTIBODY (ROUTINE TESTING W REFLEX): HIV Screen 4th Generation wRfx: NONREACTIVE

## 2021-06-13 LAB — BASIC METABOLIC PANEL
Anion gap: 8 (ref 5–15)
BUN: 12 mg/dL (ref 8–23)
CO2: 29 mmol/L (ref 22–32)
Calcium: 8.5 mg/dL — ABNORMAL LOW (ref 8.9–10.3)
Chloride: 105 mmol/L (ref 98–111)
Creatinine, Ser: 1.03 mg/dL (ref 0.61–1.24)
GFR, Estimated: >60 mL/min (ref >60)
Glucose, Bld: 121 mg/dL — ABNORMAL HIGH (ref 70–99)
Potassium: 3.8 mmol/L (ref 3.5–5.1)
Sodium: 142 mmol/L (ref 135–145)

## 2021-06-13 LAB — HEMOGLOBIN A1C
Hgb A1c MFr Bld: 8.2 % — ABNORMAL HIGH (ref 4.8–5.6)
Mean Plasma Glucose: 188.64 mg/dL

## 2021-06-13 LAB — GLUCOSE, CAPILLARY
Glucose-Capillary: 116 mg/dL — ABNORMAL HIGH (ref 70–99)
Glucose-Capillary: 206 mg/dL — ABNORMAL HIGH (ref 70–99)
Glucose-Capillary: 197 mg/dL — ABNORMAL HIGH (ref 70–99)

## 2021-06-13 MED ORDER — ATORVASTATIN CALCIUM 20 MG PO TABS: 20.0000 mg | ORAL_TABLET | Freq: Every day | ORAL | 1 refills | Status: AC

## 2021-06-13 MED ORDER — MECLIZINE HCL 25 MG PO TABS
25.0000 mg | ORAL_TABLET | Freq: Three times a day (TID) | ORAL | 0 refills | Status: DC | PRN
Start: 2021-06-13 — End: 2022-11-15

## 2021-06-13 MED ORDER — CEFDINIR 300 MG PO CAPS: 300.0000 mg | ORAL_CAPSULE | Freq: Two times a day (BID) | ORAL | 0 refills | Status: AC

## 2021-06-13 NOTE — TOC Transition Note (Signed)
Transition of Care [facility]) - CM/SW Discharge Note   Patient Details  Name: [name] [name] MRN: [id number] Date of Birth: 1950/10/25  [facility] CM/SW Contact:  Shade Flood, LCSW Phone Number: 06/13/2021, 12:16 PM   Clinical Narrative:     Pt admitted from home. PT recommending HHPT at dc. Spoke with pt who states he plans to return home at dc and he is agreeable to [facility]. Discussed CMS provider options. Pt agreeable to referral to any agency that can accept his insurance. HH arranged with [facility]. This information added to pt's AVS.   MD indicating pt will potentially dc home later today.  There are no other TOC needs for dc.  Final next level of care: [place] Barriers to Discharge: Barriers Resolved   Patient Goals and CMS Choice Patient states their goals for this hospitalization and ongoing recovery are:: go home CMS Medicare.gov Compare Post Acute Care list provided to:: Patient Choice offered to / list presented to : Patient  Discharge Placement                       Discharge Plan and Services In-house Referral: Clinical Social Work   Post Acute Care Choice: Home Health                    HH Arranged: PT [facility] Agency: [facility] Date [place]: 06/13/21   Representative spoke with at [place]: [place] ([place]) Interventions     Readmission Risk Interventions No flowsheet data found.

## 2021-06-13 NOTE — Discharge Summary (Addendum)
Physician Discharge Summary  [name] [id number] DOB: 11/24/1950 DOA: 06/12/2021  PCP: Adaline Sill, NP  Admit date: 06/12/2021 Discharge date: 06/13/2021  Time spent: 35 minutes  Recommendations for Outpatient Follow-up:  Repeat basic metabolic panel to follow lites and renal function Close monitoring of patient's CBGs/A1c with further adjustment to hypoglycemia regimen as needed. Follow-up with orthopedic service and/or neurology given ongoing right knee discomfort with ascending neuropathy. Outpatient referral to ENT for further evaluation and management of parotid nodule; patient will require CT neck images with contrast.    Discharge Diagnoses:  Principal Problem:   UTI (urinary tract infection) Active Problems:   Essential hypertension   Diabetes (HCC)   Abnormality of gait   Generalized weakness   Anxiety and depression   Dizziness   Parotid nodule Class I obesity  Discharge Condition: Stable and improved.  Discharged home with instruction to follow-up with PCP in 10 days.  Home health services for PT has been arranged.  CODE STATUS: Full code.  Diet recommendation: Heart healthy modified carbohydrate diet.  Filed Weights   06/12/21 1234  Weight: 90 kg    History of present illness:  As per H&P written by Dr. Denton Brick on 06/12/2021. [name] [name]  is a 70 y.o. male with past medical history relevant for diabetes, HTN, anxiety and depression as well as history of prior TIA and GERD who presents to the ED with nausea vomiting and diarrhea for 24 hours duration associated with generalized weakness and unsteady gait and fear of falling -Emesis was without bile or blood -Stools loose but without blood or mucus -Lipase is not elevated, WBC is normal, creatinine is 1.1 -UA suspicious for UTI CT abdomen and pelvis without acute findings however does show Mild left hydronephrosis and ureterectasis without obstructing calculus. -CT head without contrast shows  no acute findings however there is small intraparotid lymph node or a primary parotid neoplasm, nonemergent ultrasound follow-up is suggested for further evaluation. No fever  Or chills , no chest pains no palpitations, no shortness of breath or dyspnea on exertion denies head injury  Hospital Course:  1-acute cystitis without hematuria -Patient denies dysuria currently- -Feeling significantly better after fluid resuscitation and antibiotics. -Final culture results pending at time of discharge; no further episode of nausea and vomiting so is stable to attempt oral outpatient treatment.  2-generalized weakness, unsteady gait and dizziness -CT scan and MRI negative for acute neurologic abnormality. -Physical therapy have seen patient and recommended home health services -As needed meclizine has been prescribed -Continue as needed analgesics. -Patient advised to maintain adequate hydration.  3-prior history of TIA -MRI demonstrating extensive microvascular disease -Continue statins, blood pressure control on daily aspirin  4-type 2 diabetes mellitus -Poorly controlled -A1c 8.2 -CBGs in the 190s. -Will resume home hypoglycemic regimen, advised to follow modified carbohydrate diet and patient will continue follow-up with PCP for further adjustment in his treatment.  5-essential hypertension -Resume home antihypertensive agents.  6-hyperlipidemia -Continue statins.  7-class I obesity -Low calorie diet, portion control and increase physical activity discussed with patient. -Body mass index is 33.02 kg/m.  8-Parotid Nodule -Larger in comparison to previous images in 2016 -Outpatient CT neck with contrast recommended for further evaluation -Patient will benefit of referral to ENT.  Procedures: See below for x-ray report  Consultations: None  Discharge Exam: Vitals:   06/13/21 0856 06/13/21 1404  BP:  (!) 150/68  Pulse: 70 66  Resp:  16  Temp:  98.5 F (36.9 C)  SpO2:  97%    General: No nausea, no vomiting, no chest pain, no shortness of breath.  He is hemodynamically stable and feeling ready to go home.  Expressed not feeling dizzy currently Cardiovascular: S1 and S2, no rubs, no gallops, no JVD. Respiratory: Clear to auscultation bilaterally; no using accessory muscles. Abdomen: Soft, nontender, nondistended, positive bowel sounds Extremities: No cyanosis or clubbing.  Patient reporting ascending neuropathic pain on his right lower extremity.  Discharge Instructions   Discharge Instructions     Diet - low sodium heart healthy   Complete by: As directed    Diet Carb Modified   Complete by: As directed    Discharge instructions   Complete by: As directed    Arrange follow-up with PCP in 10 days To medication prescribed Maintain adequate hydration Follow instructions from home health services to assist with conditioning and rehabilitation. Complete antibiotics as instructed   Increase activity slowly   Complete by: As directed       Allergies as of 06/13/2021   No Known Allergies      Medication List     STOP taking these medications    bacitracin ointment   simvastatin 20 MG tablet Commonly known as: ZOCOR       TAKE these medications    amitriptyline 50 MG tablet Commonly known as: ELAVIL TAKE ONE TABLET BY MOUTH AT BEDTIME   amLODipine 10 MG tablet Commonly known as: NORVASC Take 10 mg by mouth daily. What changed: Another medication with the same name was removed. Continue taking this medication, and follow the directions you see here.   aspirin 325 MG tablet Take 1 tablet (325 mg total) by mouth daily.   atorvastatin 20 MG tablet Commonly known as: LIPITOR Take 1 tablet (20 mg total) by mouth daily. Start taking on: June 14, 2021   Bydureon 2 MG Pen Generic drug: Exenatide ER INJECT TWO MG SUBCUTANEOUSLY ONCE A WEEK What changed: See the new instructions.   cefdinir 300 MG capsule Commonly known as:  OMNICEF Take 1 capsule (300 mg total) by mouth 2 (two) times daily for 5 days.   DULoxetine 30 MG capsule Commonly known as: Cymbalta Take 1 capsule (30 mg total) by mouth daily.   furosemide 20 MG tablet Commonly known as: LASIX TAKE ONE TABLET BY MOUTH IN THE MORNING FOR BLOOD PRESSURE AND FLUID (MORNING) What changed: See the new instructions.   HYDROcodone-acetaminophen 5-325 MG tablet Commonly known as: NORCO/VICODIN Take 1 tablet by mouth daily as needed for severe pain. Reported on 05/08/2016 What changed: Another medication with the same name was removed. Continue taking this medication, and follow the directions you see here.   Jardiance 25 MG Tabs tablet Generic drug: empagliflozin Take 25 mg by mouth daily.   lisinopril 20 MG tablet Commonly known as: ZESTRIL TAKE ONE TABLET BY MOUTH IN THE MORNING FOR BLOOD PRESSURE What changed: See the new instructions.   meclizine 25 MG tablet Commonly known as: ANTIVERT Take 1 tablet (25 mg total) by mouth 3 (three) times daily as needed for dizziness.   metFORMIN 1000 MG tablet Commonly known as: GLUCOPHAGE TAKE ONE TABLET BY MOUTH TWICE DAILY WITH A MEAL. (MORNING ,EVENING) What changed: See the new instructions.   metoprolol succinate 100 MG 24 hr tablet Commonly known as: TOPROL-XL TAKE ONE TABLET BY MOUTH IN THE MORNING   miconazole 2 % cream Commonly known as: Micatin Apply 1 application topically 2 (two) times daily. 7 days   nitroGLYCERIN 0.4 MG SL  tablet Commonly known as: NITROSTAT Place 0.4 mg under the tongue every 5 (five) minutes as needed for chest pain.   potassium chloride 10 MEQ tablet Commonly known as: KLOR-CON TAKE ONE TABLET BY MOUTH TWICE DAILY (MORNING ,EVENING) What changed: See the new instructions.   Restasis Multidose 0.05 % ophthalmic emulsion Generic drug: cycloSPORINE Place 1 drop into both eyes daily.   triamcinolone cream 0.1 % Commonly known as: KENALOG Apply 1 application  topically 2 (two) times daily.       No Known Allergies  Follow-up Information     [facility] [place] Follow up.   Specialty: Home Health Services Why: [place] Staff will call you to arrange in home Physical Therapy visits Contact information: [address] [place] [address] [phone]         Adaline Sill, NP. Schedule an appointment as soon as possible for a visit in 10 day(s).   Specialty: Internal Medicine Contact information: [address] [place] [address] [phone]                  The results of significant diagnostics from this hospitalization (including imaging, microbiology, ancillary and laboratory) are listed below for reference.    Significant Diagnostic Studies: CT HEAD WO CONTRAST (5MM)  Result Date: 06/12/2021 CLINICAL DATA:  Dizziness and headache with onset at 8:30 a.m. No history of head injury in a 70 year old male. EXAM: CT HEAD WITHOUT CONTRAST TECHNIQUE: Contiguous axial images were obtained from the base of the skull through the vertex without intravenous contrast. COMPARISON:  Prior studies from October of 2021 in July of 2016. FINDINGS: Brain: No evidence of acute infarction, hemorrhage, hydrocephalus, extra-axial collection or mass lesion/mass effect. Signs of atrophy and chronic microvascular ischemic change as before. Vascular: No hyperdense vessel or unexpected calcification. Skull: Normal. Negative for fracture or focal lesion. Sinuses/Orbits: Visualized paranasal sinuses and orbits are unremarkable. Other: Small LEFT parotid lesion measuring approximately 9 mm with respect imaged portions (incompletely imaged on the current study) is of uncertain significance not seen on prior imaging. IMPRESSION: 1. No acute intracranial abnormality. 2. Signs of atrophy and chronic microvascular ischemic change as before. 3. Small LEFT parotid lesion measuring approximately 9 mm with respect imaged portions (incompletely  imaged on the current study) is of uncertain significance not seen on prior imaging. This may represent a small intraparotid lymph node or a primary parotid neoplasm, nonemergent ultrasound follow-up is suggested for further evaluation. Electronically Signed   By: Zetta Bills M.D.   On: 06/12/2021 13:40   MR BRAIN WO CONTRAST  Result Date: 06/13/2021 CLINICAL DATA:  Dizziness, nonspecific EXAM: MRI HEAD WITHOUT CONTRAST TECHNIQUE: Multiplanar, multiecho pulse sequences of the brain and surrounding structures were obtained without intravenous contrast. COMPARISON:  2016 FINDINGS: Brain: There is no acute infarction or intracranial hemorrhage. There is no intracranial mass, mass effect, or edema. There is no hydrocephalus or extra-axial fluid collection. Ventricles and sulci are mildly prominent reflecting parenchymal volume loss. Patchy foci of T2 hyperintensity in the supratentorial white matter are nonspecific but may reflect mild to moderate chronic microvascular ischemic changes. Vascular: Major vessel flow voids at the skull base are preserved. Skull and upper cervical spine: Normal marrow signal is preserved. Sinuses/Orbits: Paranasal sinuses are aerated. Orbits are unremarkable. Other: Sella is unremarkable. Minimal mastoid fluid opacification. There is a 1.3 cm T2 hyperintense lesion of the left parotid. Appears increased in size since 2016. IMPRESSION: No evidence of recent infarction, hemorrhage,  or mass. Mild to moderate chronic microvascular ischemic changes. 1.3 cm T2 hyperintense left parotid lesion appears larger since 2016. CT of the neck with contrast is recommended for further evaluation. Electronically Signed   By: Macy Mis M.D.   On: 06/13/2021 12:08   CT ABDOMEN PELVIS W CONTRAST  Result Date: 06/12/2021 CLINICAL DATA:  Lower abdominal pain, distension EXAM: CT ABDOMEN AND PELVIS WITH CONTRAST TECHNIQUE: Multidetector CT imaging of the abdomen and pelvis was performed using the  standard protocol following bolus administration of intravenous contrast. CONTRAST:  20m OMNIPAQUE IOHEXOL 350 MG/ML SOLN COMPARISON:  03/16/2010 FINDINGS: Lower chest: No acute abnormality. Hepatobiliary: No focal liver abnormality is seen. No gallstones, gallbladder wall thickening, or biliary dilatation. Pancreas: Unremarkable. No pancreatic ductal dilatation or surrounding inflammatory changes. Spleen: Normal in size without focal abnormality. Adrenals/Urinary Tract: Adrenal glands unremarkable. Bilateral renal cystic lesions, largest 4.3 mid right present since prior study. 2 mm nonobstructive left upper pole calculus. 1.8 cm exophytic nonspecific lesion from the lower pole left kidney, new since previous. There is mild left hydronephrosis and ureterectasis without radiodense calculus. The urinary bladder is mildly distended with smooth mild wall thickening. Stomach/Bowel: Stomach incompletely distended, unremarkable. Small bowel decompressed. Appendix not discretely identified. The colon is nondilated, unremarkable. Vascular/Lymphatic: Minimal calcified aortic plaque. No abdominal or pelvic adenopathy. Reproductive: Prostate enlargement. Other: Left pelvic phleboliths.  No ascites.  No free air. Musculoskeletal: Umbilical hernia repair sutures. Bilateral inguinal hernias containing only fat. No fracture or worrisome bone lesion. IMPRESSION: 1. Nonobstructive upper pole left urolithiasis. 2. Mild left hydronephrosis and ureterectasis without obstructing calculus. Electronically Signed   By: DLucrezia EuropeM.D.   On: 06/12/2021 14:28    Microbiology: Recent Results (from the past 240 hour(s))  Resp Panel by RT-PCR (Flu A&B, Covid) Nasopharyngeal Swab     Status: None   Collection Time: 06/12/21  5:26 PM   Specimen: Nasopharyngeal Swab; Nasopharyngeal(NP) swabs in vial transport medium  Result Value Ref Range Status   SARS Coronavirus 2 by RT PCR NEGATIVE NEGATIVE Final    Comment: (NOTE) SARS-CoV-2 target  nucleic acids are NOT DETECTED.  The SARS-CoV-2 RNA is generally detectable in upper respiratory specimens during the acute phase of infection. The lowest concentration of SARS-CoV-2 viral copies this assay can detect is 138 copies/mL. A negative result does not preclude SARS-Cov-2 infection and should not be used as the sole basis for treatment or other patient management decisions. A negative result may occur with  improper specimen collection/handling, submission of specimen other than nasopharyngeal swab, presence of viral mutation(s) within the areas targeted by this assay, and inadequate number of viral copies(<138 copies/mL). A negative result must be combined with clinical observations, patient history, and epidemiological information. The expected result is Negative.  Fact Sheet for Patients:  hEntrepreneurPulse.com.au Fact Sheet for Healthcare Providers:  hIncredibleEmployment.be This test is no t yet approved or cleared by the [place] and  has been authorized for detection and/or diagnosis of SARS-CoV-2 by FDA under an Emergency Use Authorization (EUA). This EUA will remain  in effect (meaning this test can be used) for the duration of the COVID-19 declaration under Section 564(b)(1) of the Act, 21 U.S.C.section 360bbb-3(b)(1), unless the authorization is terminated  or revoked sooner.       Influenza A by PCR NEGATIVE NEGATIVE Final   Influenza B by PCR NEGATIVE NEGATIVE Final    Comment: (NOTE) The Xpert Xpress SARS-CoV-2/FLU/RSV plus assay is intended as an aid in the  diagnosis of influenza from Nasopharyngeal swab specimens and should not be used as a sole basis for treatment. Nasal washings and aspirates are unacceptable for Xpert Xpress SARS-CoV-2/FLU/RSV testing.  Fact Sheet for Patients: EntrepreneurPulse.com.au  Fact Sheet for Healthcare  Providers: IncredibleEmployment.be  This test is not yet approved or cleared by the [place] FDA and has been authorized for detection and/or diagnosis of SARS-CoV-2 by FDA under an Emergency Use Authorization (EUA). This EUA will remain in effect (meaning this test can be used) for the duration of the COVID-19 declaration under Section 564(b)(1) of the Act, 21 U.S.C. section 360bbb-3(b)(1), unless the authorization is terminated or revoked.  Performed at [facility], [address] [place], [place] [address]      Labs: Basic Metabolic Panel: Recent Labs  Lab 06/12/21 1310 06/13/21 0534  NA 138 142  K 4.2 3.8  CL 104 105  CO2 27 29  GLUCOSE 216* 121*  BUN 11 12  CREATININE 1.13 1.03  CALCIUM 8.8* 8.5*   Liver Function Tests: Recent Labs  Lab 06/12/21 1310  AST 30  ALT 42  ALKPHOS 60  BILITOT 2.7*  PROT 7.0  ALBUMIN 4.0   Recent Labs  Lab 06/12/21 1310  LIPASE 24   CBC: Recent Labs  Lab 06/12/21 1310 06/13/21 0534  WBC 10.3 7.6  NEUTROABS 8.5*  --   HGB 15.9 15.1  HCT 46.7 45.0  MCV 85.8 87.0  PLT 203 200    CBG: Recent Labs  Lab 06/12/21 1309 06/12/21 2136 06/13/21 0724 06/13/21 1123 06/13/21 1629  GLUCAP 210* 192* 116* 206* 197*    Signed:  Barton Dubois MD.  [facility] 06/13/2021, 4:56 PM

## 2021-06-13 NOTE — Evaluation (Signed)
Physical Therapy Evaluation Patient Details Name: [name] [name] MRN: [id number] DOB: Jan 26, 1951 Today's Date: 06/13/2021   History of Present Illness  [name] [name]  is a 70 y.o. male with past medical history relevant for diabetes, HTN, anxiety and depression as well as history of prior TIA and GERD who presents to the ED with nausea vomiting and diarrhea for 24 hours duration associated with generalized weakness and unsteady gait and fear of falling   Clinical Impression  Patient required Min/min guard assist to help pull self to sitting up at bedside, unable to maintain standing balance without AD, required the use of RW for safety and able to ambulate in hallway with occasional staggering left/right when making turns with 1 near loss of balance and limited mostly due to c/o fatigue.  Patient tolerated sitting up in chair after therapy.  Patient will benefit from continued physical therapy in hospital and recommended venue below to increase strength, balance, endurance for safe ADLs and gait.      Follow Up Recommendations Home health PT;Supervision for mobility/OOB;Supervision - Intermittent    Equipment Recommendations  None recommended by PT    Recommendations for Other Services       Precautions / Restrictions Precautions Precautions: Fall Restrictions Weight Bearing Restrictions: No      Mobility  Bed Mobility Overal bed mobility: Needs Assistance Bed Mobility: Supine to Sit     Supine to sit: Min guard;Min assist     General bed mobility comments: increased time, labored movement    Transfers Overall transfer level: Needs assistance Equipment used: Rolling walker (2 wheeled) Transfers: Sit to/from [facility] Sit to Stand: Min guard Stand pivot transfers: Min guard       General transfer comment: unable to maintain standing balance when attempting sit to stands without AD, required use of RW for safety  Ambulation/Gait Ambulation/Gait  assistance: Min guard;Min assist Gait Distance (Feet): 55 Feet Assistive device: Rolling walker (2 wheeled) Gait Pattern/deviations: Decreased step length - left;Decreased stance time - right;Decreased stride length;Staggering right;Staggering left Gait velocity: decreased   General Gait Details: slow labord cadence with occasional staggering left/right when making turns with 1 near loss of balance  Stairs            Wheelchair Mobility    Modified Rankin (Stroke Patients Only)       Balance Overall balance assessment: Needs assistance Sitting-balance support: Feet supported;No upper extremity supported Sitting balance-Leahy Scale: Good Sitting balance - Comments: seated at EOB   Standing balance support: During functional activity;No upper extremity supported Standing balance-Leahy Scale: Poor Standing balance comment: fair using RW                             Pertinent Vitals/Pain Pain Assessment: No/denies pain    Home Living Family/patient expects to be discharged to:: Private residence Living Arrangements: Spouse/significant other Available Help at Discharge: Family Type of Home: Apartment Home Access: Level entry     Home Layout: One level Home Equipment: Environmental consultant - 2 wheels;Shower seat;Grab bars - tub/shower;Bedside commode      Prior Function Level of Independence: Needs assistance   Gait / Transfers Assistance Needed: household ambulator without AD, does not drive  ADL's / Homemaking Assistance Needed: has home aide that assist with community ADLs and takes care of his spouse who is w/c bound        Hand Dominance   Dominant Hand: Right    Extremity/Trunk Assessment  Upper Extremity Assessment Upper Extremity Assessment: Generalized weakness    Lower Extremity Assessment Lower Extremity Assessment: Generalized weakness    Cervical / Trunk Assessment Cervical / Trunk Assessment: Normal  Communication   Communication: No  difficulties  Cognition Arousal/Alertness: Awake/alert Behavior During Therapy: WFL for tasks assessed/performed Overall Cognitive Status: Within Functional Limits for tasks assessed                                        General Comments      Exercises     Assessment/Plan    PT Assessment Patient needs continued PT services  PT Problem List Decreased strength;Decreased activity tolerance;Decreased balance;Decreased mobility       PT Treatment Interventions DME instruction;Gait training;Functional mobility training;Therapeutic activities;Therapeutic exercise;Stair training;Balance training;Patient/family education    PT Goals (Current goals can be found in the Care Plan section)  Acute Rehab PT Goals Patient Stated Goal: return home with possible home aide for himself PT Goal Formulation: With patient Time For Goal Achievement: 06/17/21 Potential to Achieve Goals: Good    Frequency Min 3X/week   Barriers to discharge        Co-evaluation               AM-PAC PT "6 Clicks" Mobility  Outcome Measure Help needed turning from your back to your side while in a flat bed without using bedrails?: None Help needed moving from lying on your back to sitting on the side of a flat bed without using bedrails?: A Little Help needed moving to and from a bed to a chair (including a wheelchair)?: A Little Help needed standing up from a chair using your arms (e.g., wheelchair or bedside chair)?: A Little Help needed to walk in hospital room?: A Little Help needed climbing 3-5 steps with a railing? : A Lot 6 Click Score: 18    End of Session   Activity Tolerance: Patient tolerated treatment well;Patient limited by fatigue Patient left: in chair;with call bell/phone within reach Nurse Communication: Mobility status PT Visit Diagnosis: Unsteadiness on feet (R26.81);Other abnormalities of gait and mobility (R26.89);Muscle weakness (generalized) (M62.81)    Time:  [id number] PT Time Calculation (min) (ACUTE ONLY): 27 min   Charges:   PT Evaluation $PT Eval Moderate Complexity: 1 Mod PT Treatments $Therapeutic Activity: 23-37 mins        1:54 PM, 06/13/21 Lonell Grandchild, MPT Physical Therapist with [facility] [phone] office [phone] mobile phone

## 2021-06-13 NOTE — Care Management Obs Status (Signed)
[place] NOTIFICATION   Patient Details  Name: GAYLARD HITT MRN: [id number] Date of Birth: [birth year]   Medicare Observation Status Notification Given:       Tommy Medal 06/13/2021, 4:18 PM

## 2021-06-13 NOTE — Plan of Care (Signed)
  Problem: Acute Rehab PT Goals(only PT should resolve) Goal: Pt Will Go Supine/Side To Sit Outcome: Progressing Flowsheets (Taken 06/13/2021 1355) Pt will go Supine/Side to Sit:  with supervision  with modified independence Goal: Patient Will Transfer Sit To/From Stand Outcome: Progressing Flowsheets (Taken 06/13/2021 1355) Patient will transfer sit to/from stand: with supervision Goal: Pt Will Transfer Bed To Chair/Chair To Bed Outcome: Progressing Flowsheets (Taken 06/13/2021 1355) Pt will Transfer Bed to Chair/Chair to Bed: with supervision Goal: Pt Will Ambulate Outcome: Progressing Flowsheets (Taken 06/13/2021 1355) Pt will Ambulate:  75 feet  with supervision  with rolling walker   1:56 PM, 06/13/21 Lonell Grandchild, MPT Physical Therapist with [facility] [phone] office [phone] mobile phone

## 2021-06-13 NOTE — Care Management Obs Status (Signed)
[place] NOTIFICATION   Patient Details  Name: [name] [name] MRN: [id number] Date of Birth: 1950/11/13   Medicare Observation Status Notification Given:  Yes    Tommy Medal 06/13/2021, 4:19 PM

## 2021-06-14 LAB — URINE CULTURE: Culture: 80000 — AB

## 2021-09-19 ENCOUNTER — Emergency Department (HOSPITAL_COMMUNITY)
Admission: EM | Admit: 2021-09-19 | Discharge: 2021-09-19 | Disposition: A | Discharge status: Home / Self Care | Payer: Medicare Other | Attending: Emergency Medicine | Admitting: Emergency Medicine

## 2021-09-19 ENCOUNTER — Emergency Department (HOSPITAL_COMMUNITY): Payer: Medicare Other

## 2021-09-19 ENCOUNTER — Encounter (HOSPITAL_COMMUNITY): Payer: Self-pay | Admitting: Emergency Medicine

## 2021-09-19 DIAGNOSIS — I10 Essential (primary) hypertension: Secondary | ICD-10-CM | POA: Insufficient documentation

## 2021-09-19 DIAGNOSIS — S46911A Strain of unspecified muscle, fascia and tendon at shoulder and upper arm level, right arm, initial encounter: Secondary | ICD-10-CM | POA: Diagnosis not present

## 2021-09-19 DIAGNOSIS — Z79899 Other long term (current) drug therapy: Secondary | ICD-10-CM | POA: Diagnosis not present

## 2021-09-19 DIAGNOSIS — Z7982 Long term (current) use of aspirin: Secondary | ICD-10-CM | POA: Insufficient documentation

## 2021-09-19 DIAGNOSIS — E119 Type 2 diabetes mellitus without complications: Secondary | ICD-10-CM | POA: Insufficient documentation

## 2021-09-19 DIAGNOSIS — Z7984 Long term (current) use of oral hypoglycemic drugs: Secondary | ICD-10-CM | POA: Diagnosis not present

## 2021-09-19 DIAGNOSIS — Y92410 Unspecified street and highway as the place of occurrence of the external cause: Secondary | ICD-10-CM | POA: Insufficient documentation

## 2021-09-19 DIAGNOSIS — R519 Headache, unspecified: Secondary | ICD-10-CM | POA: Insufficient documentation

## 2021-09-19 DIAGNOSIS — S4991XA Unspecified injury of right shoulder and upper arm, initial encounter: Secondary | ICD-10-CM | POA: Diagnosis present

## 2021-09-19 MED ORDER — ACETAMINOPHEN 500 MG PO TABS
1000.0000 mg | ORAL_TABLET | Freq: Once | ORAL | Status: AC
  Administered 2021-09-19: 21:00:00 1000 mg via ORAL
  Filled 2021-09-19: qty 2
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
## 2021-09-19 NOTE — Discharge Instructions (Addendum)
If you develop new or worsening headache, neck pain, or if you develop chest pain, shortness of breath, weakness or numbness, or any other new concerning symptoms then return to the ER for evaluation.

## 2021-09-19 NOTE — ED Notes (Signed)
Dc instructions reviewed with pt. No questions or concerns at this time. Pt wheeled out by friend who is transporting him home.
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
## 2022-08-04 ENCOUNTER — Encounter (HOSPITAL_COMMUNITY)
Admission: RE | Admit: 2022-08-04 | Discharge: 2022-08-04 | Disposition: A | Discharge status: Home / Self Care | Payer: Medicare HMO | Source: Ambulatory Visit | Attending: Ophthalmology | Admitting: Ophthalmology

## 2022-08-08 NOTE — Pre-Procedure Instructions (Signed)
Spoke with patient's sister- [name] [name], she will have patient call us.
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
## 2022-08-08 NOTE — Pre-Procedure Instructions (Signed)
Attempted pre-op phone call. Phone [phone] rings with no VM. Could not leave a message

## 2022-08-14 ENCOUNTER — Encounter (HOSPITAL_COMMUNITY): Payer: Self-pay | Admitting: Ophthalmology

## 2022-08-14 ENCOUNTER — Ambulatory Visit (HOSPITAL_COMMUNITY): Payer: Medicare HMO | Admitting: Anesthesiology

## 2022-08-14 ENCOUNTER — Ambulatory Visit (HOSPITAL_BASED_OUTPATIENT_CLINIC_OR_DEPARTMENT_OTHER): Payer: Medicare HMO | Admitting: Anesthesiology

## 2022-08-14 ENCOUNTER — Encounter (HOSPITAL_COMMUNITY)
Admission: RE | Disposition: A | Discharge status: Home / Self Care | Payer: Self-pay | Source: Home / Self Care | Attending: Ophthalmology

## 2022-08-14 ENCOUNTER — Ambulatory Visit (HOSPITAL_COMMUNITY)
Admission: RE | Admit: 2022-08-14 | Discharge: 2022-08-14 | Disposition: A | Discharge status: Home / Self Care | Payer: Medicare HMO | Attending: Ophthalmology | Admitting: Ophthalmology

## 2022-08-14 DIAGNOSIS — H5442A5 Blindness left eye category 5, normal vision right eye: Secondary | ICD-10-CM | POA: Insufficient documentation

## 2022-08-14 DIAGNOSIS — G4730 Sleep apnea, unspecified: Secondary | ICD-10-CM | POA: Diagnosis not present

## 2022-08-14 DIAGNOSIS — Q148 Other congenital malformations of posterior segment of eye: Secondary | ICD-10-CM | POA: Diagnosis not present

## 2022-08-14 DIAGNOSIS — E1136 Type 2 diabetes mellitus with diabetic cataract: Secondary | ICD-10-CM | POA: Diagnosis present

## 2022-08-14 DIAGNOSIS — H0100A Unspecified blepharitis right eye, upper and lower eyelids: Secondary | ICD-10-CM | POA: Diagnosis not present

## 2022-08-14 DIAGNOSIS — H02834 Dermatochalasis of left upper eyelid: Secondary | ICD-10-CM | POA: Insufficient documentation

## 2022-08-14 DIAGNOSIS — Z7984 Long term (current) use of oral hypoglycemic drugs: Secondary | ICD-10-CM | POA: Diagnosis not present

## 2022-08-14 DIAGNOSIS — H02831 Dermatochalasis of right upper eyelid: Secondary | ICD-10-CM | POA: Insufficient documentation

## 2022-08-14 DIAGNOSIS — H18413 Arcus senilis, bilateral: Secondary | ICD-10-CM | POA: Diagnosis not present

## 2022-08-14 DIAGNOSIS — H25811 Combined forms of age-related cataract, right eye: Secondary | ICD-10-CM | POA: Insufficient documentation

## 2022-08-14 DIAGNOSIS — E119 Type 2 diabetes mellitus without complications: Secondary | ICD-10-CM

## 2022-08-14 DIAGNOSIS — H11151 Pinguecula, right eye: Secondary | ICD-10-CM | POA: Diagnosis not present

## 2022-08-14 DIAGNOSIS — I10 Essential (primary) hypertension: Secondary | ICD-10-CM | POA: Diagnosis not present

## 2022-08-14 DIAGNOSIS — H0100B Unspecified blepharitis left eye, upper and lower eyelids: Secondary | ICD-10-CM | POA: Diagnosis not present

## 2022-08-14 HISTORY — PX: CATARACT EXTRACTION W/PHACO: SHX586

## 2022-08-14 SURGERY — PHACOEMULSIFICATION, CATARACT, WITH IOL INSERTION
Anesthesia: Monitor Anesthesia Care | Site: Eye | Laterality: Right

## 2022-08-14 MED ORDER — PHENYLEPHRINE-KETOROLAC 1-0.3 % IO SOLN
INTRAOCULAR | Status: AC
  Filled 2022-08-14: qty 4

## 2022-08-14 MED ORDER — SODIUM HYALURONATE 23MG/ML IO SOSY
PREFILLED_SYRINGE | INTRAOCULAR | Status: DC | PRN
  Administered 2022-08-14: 0.6 mL via INTRAOCULAR

## 2022-08-14 MED ORDER — TETRACAINE HCL 0.5 % OP SOLN
1.0000 [drp] | OPHTHALMIC | Status: AC | PRN
  Administered 2022-08-14 (×3): 1 [drp] via OPHTHALMIC

## 2022-08-14 MED ORDER — BSS IO SOLN
INTRAOCULAR | Status: DC | PRN
  Administered 2022-08-14: 15 mL via INTRAOCULAR

## 2022-08-14 MED ORDER — EPINEPHRINE PF 1 MG/ML IJ SOLN
INTRAMUSCULAR | Status: AC
  Filled 2022-08-14: qty 2

## 2022-08-14 MED ORDER — MOXIFLOXACIN HCL 0.5 % OP SOLN
OPHTHALMIC | Status: DC | PRN
  Administered 2022-08-14: 0.2 mL via OPHTHALMIC

## 2022-08-14 MED ORDER — MIDAZOLAM HCL 2 MG/2ML IJ SOLN
INTRAMUSCULAR | Status: AC
  Filled 2022-08-14: qty 2

## 2022-08-14 MED ORDER — TROPICAMIDE 1 % OP SOLN
1.0000 [drp] | OPHTHALMIC | Status: AC | PRN
  Administered 2022-08-14 (×3): 1 [drp] via OPHTHALMIC

## 2022-08-14 MED ORDER — POVIDONE-IODINE 5 % OP SOLN
OPHTHALMIC | Status: DC | PRN
  Administered 2022-08-14: 1 via OPHTHALMIC

## 2022-08-14 MED ORDER — STERILE WATER FOR IRRIGATION IR SOLN
Status: DC | PRN
  Administered 2022-08-14: 250 mL

## 2022-08-14 MED ORDER — SODIUM HYALURONATE 10 MG/ML IO SOLUTION
PREFILLED_SYRINGE | INTRAOCULAR | Status: DC | PRN
  Administered 2022-08-14: 0.85 mL via INTRAOCULAR

## 2022-08-14 MED ORDER — MOXIFLOXACIN HCL 5 MG/ML IO SOLN
INTRAOCULAR | Status: AC
  Filled 2022-08-14: qty 1

## 2022-08-14 MED ORDER — PHENYLEPHRINE HCL 2.5 % OP SOLN
1.0000 [drp] | OPHTHALMIC | Status: AC | PRN
  Administered 2022-08-14 (×3): 1 [drp] via OPHTHALMIC

## 2022-08-14 MED ORDER — PHENYLEPHRINE-KETOROLAC 1-0.3 % IO SOLN
INTRAOCULAR | Status: DC | PRN
  Administered 2022-08-14: 500 mL via OPHTHALMIC

## 2022-08-14 MED ORDER — LIDOCAINE HCL 3.5 % OP GEL
1.0000 | Freq: Once | OPHTHALMIC | Status: AC
  Administered 2022-08-14: 1 via OPHTHALMIC

## 2022-08-14 MED ORDER — SODIUM CHLORIDE 0.9% FLUSH
INTRAVENOUS | Status: DC | PRN
  Administered 2022-08-14: 10 mL via INTRAVENOUS

## 2022-08-14 MED ORDER — LIDOCAINE HCL (PF) 1 % IJ SOLN
INTRAOCULAR | Status: DC | PRN
  Administered 2022-08-14: 1 mL via OPHTHALMIC

## 2022-08-14 MED ORDER — MIDAZOLAM HCL 2 MG/2ML IJ SOLN
INTRAMUSCULAR | Status: DC | PRN
  Administered 2022-08-14: 2 mg via INTRAVENOUS

## 2022-08-14 SURGICAL SUPPLY — 16 items
CATARACT SUITE SIGHTPATH (MISCELLANEOUS) ×1 IMPLANT
CLOTH BEACON ORANGE TIMEOUT ST (SAFETY) ×1 IMPLANT
DRAPE HALF SHEET 40X57 (DRAPES) IMPLANT
EYE SHIELD UNIVERSAL CLEAR (GAUZE/BANDAGES/DRESSINGS) IMPLANT
FEE CATARACT SUITE SIGHTPATH (MISCELLANEOUS) ×1 IMPLANT
GLOVE BIOGEL PI IND STRL 7.0 (GLOVE) ×2 IMPLANT
GLOVE SS BIOGEL STRL SZ 6.5 (GLOVE) IMPLANT
LENS IOL RAYNER 20.0 (Intraocular Lens) ×1 IMPLANT
LENS IOL RAYONE EMV 20.0 (Intraocular Lens) IMPLANT
NDL HYPO 18GX1.5 BLUNT FILL (NEEDLE) ×1 IMPLANT
NEEDLE HYPO 18GX1.5 BLUNT FILL (NEEDLE) ×1 IMPLANT
PAD ARMBOARD 7.5X6 YLW CONV (MISCELLANEOUS) ×1 IMPLANT
SYR TB 1ML LL NO SAFETY (SYRINGE) ×1 IMPLANT
TAPE SURG TRANSPORE 1 IN (GAUZE/BANDAGES/DRESSINGS) IMPLANT
TAPE SURGICAL TRANSPORE 1 IN (GAUZE/BANDAGES/DRESSINGS) ×1
WATER STERILE IRR 250ML POUR (IV SOLUTION) ×1 IMPLANT

## 2022-08-14 NOTE — Interval H&P Note (Signed)
History and Physical Interval Note:  08/14/2022 11:03 AM  [name] [name]  has presented today for surgery, with the diagnosis of combined forms age related cataract; right.  The various methods of treatment have been discussed with the patient and family. After consideration of risks, benefits and other options for treatment, the patient has consented to  Procedure(s) with comments: CATARACT EXTRACTION PHACO AND INTRAOCULAR LENS PLACEMENT ([place]) (Right) - CDE as a surgical intervention.  The patient's history has been reviewed, patient examined, no change in status, stable for surgery.  I have reviewed the patient's chart and labs.  Questions were answered to the patient's satisfaction.     Baruch Goldmann

## 2022-08-14 NOTE — Transfer of Care (Signed)
Immediate Anesthesia Transfer of Care Note  Patient: [name] [name]  Procedure(s) Performed: CATARACT EXTRACTION PHACO AND INTRAOCULAR LENS PLACEMENT ([facility]) (Right: Eye)  Patient Location: Short Stay  Anesthesia Type:MAC  Level of Consciousness: awake, alert , oriented and patient cooperative  Airway & Oxygen Therapy: Patient Spontanous Breathing  Post-op Assessment: Report given to RN, Post -op Vital signs reviewed and stable and Patient moving all extremities X 4  Post vital signs: Reviewed and stable  Last Vitals:  Vitals Value Taken Time  BP    Temp    Pulse    Resp    SpO2      Last Pain:  Vitals:   08/14/22 1028  TempSrc: Oral  PainSc: 0-No pain      Patients Stated Pain Goal: 6 (24/11/46 4314)  Complications: No notable events documented.

## 2022-08-14 NOTE — Discharge Instructions (Signed)
Please discharge patient when stable, will follow up today with Dr. Audelia Knape at the [facility] office immediately following discharge.  Leave shield in place until visit.  All paperwork with discharge instructions will be given at the office.  [facility] Address:  [address]  [place], [place] [address]  

## 2022-08-14 NOTE — Op Note (Signed)
Date of procedure: 08/14/22  Pre-operative diagnosis:  Visually significant combined form age-related cataract, Right Eye (H25.811)  Post-operative diagnosis:  Visually significant combined form age-related cataract, Right Eye (H25.811)  Procedure: Removal of cataract via phacoemulsification and insertion of intra-ocular lens Rayner [id number] +20.0D into the capsular bag of the Right Eye  Attending surgeon: Gerda Diss. Danniel Grenz, MD, MA  Anesthesia: MAC, Topical [place]  Complications: None  Estimated Blood Loss: <55m (minimal)  Specimens: None  Implants: As above  Indications:  Visually significant age-related cataract, Right Eye  Procedure:  The patient was seen and identified in the pre-operative area. The operative eye was identified and dilated.  The operative eye was marked.  Topical anesthesia was administered to the operative eye.     The patient was then to the operative suite and placed in the supine position.  A timeout was performed confirming the patient, procedure to be performed, and all other relevant information.   The patient's face was prepped and draped in the usual fashion for intra-ocular surgery.  A lid speculum was placed into the operative eye and the surgical microscope moved into place and focused.  A superotemporal paracentesis was created using a 20 gauge paracentesis blade.  Shugarcaine was injected into the anterior chamber.  Viscoelastic was injected into the anterior chamber.  A temporal clear-corneal main wound incision was created using a 2.462mmicrokeratome.  A continuous curvilinear capsulorrhexis was initiated using an irrigating cystitome and completed using capsulorrhexis forceps.  Hydrodissection and hydrodeliniation were performed.  Viscoelastic was injected into the anterior chamber.  A phacoemulsification handpiece and a chopper as a second instrument were used to remove the nucleus and epinucleus. The irrigation/aspiration handpiece was used to remove any  remaining cortical material.   The capsular bag was reinflated with viscoelastic, checked, and found to be intact.  The intraocular lens was inserted into the capsular bag.  The irrigation/aspiration handpiece was used to remove any remaining viscoelastic.  The clear corneal wound and paracentesis wounds were then hydrated and checked with Weck-Cels to be watertight. 0.42m77mf moxifloxacin was injected into the anterior chamber. The lid-speculum was removed.  The drape was removed.  The patient's face was cleaned with a wet and dry 4x4. A clear shield was taped over the eye. The patient was taken to the post-operative care unit in good condition, having tolerated the procedure well.  Post-Op Instructions: [facility]operative evaluation and will receive all other orders and instructions.

## 2022-08-14 NOTE — Anesthesia Preprocedure Evaluation (Signed)
Anesthesia Evaluation  Patient identified by MRN, date of birth, ID band Patient awake    Reviewed: Allergy & Precautions, H&P , NPO status , Patient's Chart, lab work & pertinent test results, reviewed documented beta blocker date and time   Airway Mallampati: II  TM Distance: >3 FB Neck ROM: full    Dental no notable dental hx.    Pulmonary sleep apnea ,    Pulmonary exam normal breath sounds clear to auscultation       Cardiovascular Exercise Tolerance: Good hypertension, negative cardio ROS   Rhythm:regular Rate:Normal     Neuro/Psych PSYCHIATRIC DISORDERS Anxiety Depression TIA   GI/Hepatic Neg liver ROS, GERD  Medicated,  Endo/Other  negative endocrine ROSdiabetes, Type 2  Renal/GU negative Renal ROS  negative genitourinary   Musculoskeletal   Abdominal   Peds  Hematology negative hematology ROS (+)   Anesthesia Other Findings   Reproductive/Obstetrics negative OB ROS                             Anesthesia Physical Anesthesia Plan  ASA: 3  Anesthesia Plan: MAC   Post-op Pain Management:    Induction:   PONV Risk Score and Plan:   Airway Management Planned:   Additional Equipment:   Intra-op Plan:   Post-operative Plan:   Informed Consent: I have reviewed the patients History and Physical, chart, labs and discussed the procedure including the risks, benefits and alternatives for the proposed anesthesia with the patient or authorized representative who has indicated his/her understanding and acceptance.     Dental Advisory Given  Plan Discussed with: CRNA  Anesthesia Plan Comments:         Anesthesia Quick Evaluation

## 2022-08-14 NOTE — Anesthesia Postprocedure Evaluation (Signed)
Anesthesia Post Note  Patient: BERTIN INABINET  Procedure(s) Performed: CATARACT EXTRACTION PHACO AND INTRAOCULAR LENS PLACEMENT ([facility]) (Right: Eye)  Patient location during evaluation: Phase II Anesthesia Type: MAC Level of consciousness: awake Pain management: pain level controlled Vital Signs Assessment: post-procedure vital signs reviewed and stable Respiratory status: spontaneous breathing and respiratory function stable Cardiovascular status: blood pressure returned to baseline and stable Postop Assessment: no headache and no apparent nausea or vomiting Anesthetic complications: no Comments: Late entry   No notable events documented.   Last Vitals:  Vitals:   08/14/22 1028 08/14/22 1131  BP: (!) 173/90 (!) 154/91  Pulse: 67 68  Resp: 16 12  Temp: 36.6 C 36.6 C  SpO2: 100% 92%    Last Pain:  Vitals:   08/14/22 1131  TempSrc: Oral  PainSc: 0-No pain                 Louann Sjogren

## 2022-08-15 ENCOUNTER — Encounter (HOSPITAL_COMMUNITY): Payer: Self-pay | Admitting: Ophthalmology

## 2022-10-10 ENCOUNTER — Encounter: Payer: Self-pay | Admitting: Neurology

## 2022-10-17 ENCOUNTER — Ambulatory Visit: Payer: Medicare HMO | Admitting: Neurology

## 2022-10-25 ENCOUNTER — Other Ambulatory Visit: Payer: Self-pay

## 2022-10-25 DIAGNOSIS — G459 Transient cerebral ischemic attack, unspecified: Secondary | ICD-10-CM

## 2022-10-25 DIAGNOSIS — I6529 Occlusion and stenosis of unspecified carotid artery: Secondary | ICD-10-CM

## 2022-10-29 NOTE — Progress Notes (Signed)
NEUROLOGY CONSULTATION NOTE  [name] [name] MRN: [id number] DOB: 03/01/1951  Referring provider: Sharlyne Cai, NP Primary care provider: Sharlyne Cai, NP  Reason for consult:  right amaurosis fugax  Assessment/Plan:   Vision loss - as he is already blind in his left eye, it is difficult to ascertain if his symptoms were related to right amaurosis fugax or bilateral PCA hypoperfusion.  I suspect he had amaurosis fugax in the right eye rather than transient bilateral occipital hypoperfusion. Bilateral P2 posterior cerebral artery stenosis  Hypertension - elevated today.  Advised to follow up with PCP Type 2 diabetes  Continue ASA '81mg'$  and Plavix '75mg'$  daily for 3 months (until about March 18), followed by ASA '81mg'$  daily monotherapy (discontinue Plavix) Secondary stroke prevention as otherwise managed by PCP: Continue statin therapy.  LDL goal less than 70 Hgb A1c goal less than 7 Normotensive blood pressure  Would check 2 week cardiac event monitor Follow up 4 to 5 months.    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  History supplemented by hospital notes.  On 10/09/2022, patient had sudden painless dark vision loss in the right eye lasting 20-30 minutes.  No associated headache, facial droop, unilateral numbness or weakness.  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  He saw the ophthalmologist whose exam was unremarkable but told him to go the ED.  He was evaluated at [facility].  CT and follow up MRI of brain revealed no acute abnormalities.  CTA of head and neck revealed bilateral severe P2 PCA stenosis but otherwise no LVO or stenosis.  Sed rate was 7.  Patient declined admission for TIA workup.  Prior to hospitalization, he was taking ASA '81mg'$  but not  consistently.  He was discharged on ASA '81mg'$ , Plavix '75mg'$  and atorvastatin '80mg'$  daily.  He had an outpatient TTE on 10/20/2022 which revealed LVEF >55%.  Atorvastatin was subsequently changed to simvastatin '20mg'$  daily.     PAST MEDICAL HISTORY: 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   Borderline diabetes mellitus    Depression with anxiety    Diabetes mellitus without complication (HCC)    GERD (gastroesophageal reflux disease)    HOH (hard of hearing)    Hypertension    Obstructive sleep apnea on CPAP    Stroke [facility])     PAST SURGICAL HISTORY: Past Surgical History:  Procedure Laterality Date   CATARACT EXTRACTION W/PHACO Right 08/14/2022   Procedure: CATARACT EXTRACTION PHACO AND INTRAOCULAR LENS PLACEMENT ([place]);  Surgeon: Baruch Goldmann, MD;  Location: [facility];  Service: Ophthalmology;  Laterality: Right;  CDE 12.51   RECTAL SURGERY     As an infant   TONSILLECTOMY     UMBILICAL HERNIA REPAIR      MEDICATIONS: Current Outpatient Medications on File Prior to Visit  Medication Sig Dispense Refill   amitriptyline (ELAVIL) 50 MG tablet TAKE ONE TABLET BY MOUTH AT BEDTIME 30 tablet 2   aspirin 325 MG tablet Take 1 tablet (325 mg total) by mouth daily.     atorvastatin (LIPITOR) 20 MG tablet Take 1 tablet (20 mg total) by mouth daily. 30 tablet 1   BYDUREON 2 MG PEN INJECT TWO MG SUBCUTANEOUSLY ONCE A WEEK (Patient  taking differently: Inject 2 mg into the skin once a week.) 4 each 1   furosemide (LASIX) 20 MG tablet TAKE ONE TABLET BY MOUTH IN THE MORNING FOR BLOOD PRESSURE AND FLUID (MORNING) (Patient taking differently: Take 20 mg by mouth daily.) 30 tablet 5   HYDROcodone-acetaminophen (NORCO) 7.5-325 MG tablet 1 tablet as needed Orally bid prn for 30     HYDROcodone-acetaminophen (NORCO/VICODIN) 5-325 MG tablet Take 1 tablet by mouth daily as needed for severe pain. Reported on 05/08/2016 30 tablet 0   JARDIANCE 25  MG TABS tablet Take 25 mg by mouth daily.     lisinopril (PRINIVIL,ZESTRIL) 20 MG tablet TAKE ONE TABLET BY MOUTH IN THE MORNING FOR BLOOD PRESSURE (Patient taking differently: Take 20 mg by mouth daily.) 30 tablet 5   meclizine (ANTIVERT) 25 MG tablet Take 1 tablet (25 mg total) by mouth 3 (three) times daily as needed for dizziness. 30 tablet 0   metFORMIN (GLUCOPHAGE) 1000 MG tablet TAKE ONE TABLET BY MOUTH TWICE DAILY WITH A MEAL. (MORNING ,EVENING) (Patient taking differently: Take 1,000 mg by mouth 2 (two) times daily with a meal.) 180 tablet 0   metoprolol succinate (TOPROL-XL) 100 MG 24 hr tablet TAKE ONE TABLET BY MOUTH IN THE MORNING (Patient not taking: No sig reported) 30 tablet 5   miconazole (MICATIN) 2 % cream Apply 1 application topically 2 (two) times daily. 7 days 28.35 g 0   nitroGLYCERIN (NITROSTAT) 0.4 MG SL tablet Place 0.4 mg under the tongue every 5 (five) minutes as needed for chest pain.     potassium chloride (K-DUR,KLOR-CON) 10 MEQ tablet TAKE ONE TABLET BY MOUTH TWICE DAILY (MORNING ,EVENING) (Patient taking differently: Take 10 mEq by mouth once.) 60 tablet 5   RESTASIS MULTIDOSE 0.05 % ophthalmic emulsion Place 1 drop into both eyes daily.  3   simvastatin (ZOCOR) 20 MG tablet SMARTSIG:1 Tablet(s) By Mouth Every Evening     TRESIBA FLEXTOUCH 100 UNIT/ML FlexTouch Pen SMARTSIG:20 Unit(s) SUB-Q Daily     triamcinolone cream (KENALOG) 0.1 % Apply 1 application topically 2 (two) times daily.     No current facility-administered medications on file prior to visit.    ALLERGIES: No Known Allergies  FAMILY HISTORY: Family History  Problem Relation Age of Onset   Sudden death Sister    Dementia Other    Alzheimer's disease Other    Diabetes Other    Coronary artery disease Other    Heart attack Other    Cancer Other    Lung cancer Other    Cancer Mother        lung   Diabetes Mother    Cancer Father    Diabetes Father    Colon cancer Neg Hx     Objective:   Blood pressure (!) 181/96, pulse 100, resp. rate 20, height '5\' 1"'$  (1.549 m), weight 182 lb (82.6 kg), SpO2 98 %. General: No acute distress.  Patient appears well-groomed.   Head:  Normocephalic/atraumatic Eyes:  fundi examined but not visualized Neck: supple, no paraspinal tenderness, full range of motion Back: No paraspinal tenderness Heart: regular rate and rhythm Lungs: Clear to auscultation bilaterally. Vascular: No carotid bruits. Neurological Exam: Mental status: alert and oriented to person, place, and time, speech fluent and not dysarthric, language intact. Cranial nerves: CN I: not tested CN II: right pupil round and reactive to light, irregular fixed left pupil, vision loss in left eye CN III, IV, VI:  full range of motion, no nystagmus,  no ptosis CN V: facial sensation intact. CN VII: upper and lower face symmetric CN VIII: hearing intact CN IX, X: gag intact, uvula midline CN XI: sternocleidomastoid and trapezius muscles intact CN XII: tongue midline Bulk & Tone: normal, no fasciculations. Motor:  muscle strength 5/5 throughout Sensation:  Pinprick sensation intact.  Vibratory sensation reduced in toes Deep Tendon Reflexes:  2+ throughout,  toes downgoing.   Finger to nose testing:  Without dysmetria.   Heel to shin:  Without dysmetria.   Gait:  steady.  Romberg negative.    Thank you for allowing me to take part in the care of this patient.  Metta Clines, DO  CC: Sharlyne Cai, NP

## 2022-10-30 ENCOUNTER — Encounter: Payer: Self-pay | Admitting: Neurology

## 2022-10-30 ENCOUNTER — Ambulatory Visit (INDEPENDENT_AMBULATORY_CARE_PROVIDER_SITE_OTHER): Payer: Medicare PPO | Admitting: Neurology

## 2022-10-30 VITALS — BP 181/96 | HR 100 | Resp 20 | Ht 61.0 in | Wt 182.0 lb

## 2022-10-30 DIAGNOSIS — I679 Cerebrovascular disease, unspecified: Secondary | ICD-10-CM | POA: Diagnosis not present

## 2022-10-30 DIAGNOSIS — G453 Amaurosis fugax: Secondary | ICD-10-CM | POA: Diagnosis not present

## 2022-10-30 DIAGNOSIS — E119 Type 2 diabetes mellitus without complications: Secondary | ICD-10-CM | POA: Diagnosis not present

## 2022-10-30 DIAGNOSIS — I10 Essential (primary) hypertension: Secondary | ICD-10-CM | POA: Diagnosis not present

## 2022-10-30 NOTE — Patient Instructions (Addendum)
Stop clopidogrel (Plavix).  Going forward, continue aspirin '81mg'$  daily Continue simvastatin, blood pressure management and diabetes management as managed by your PCP Will refer you to cardiology for holter monitor Follow up 4 months.

## 2022-11-01 ENCOUNTER — Encounter: Payer: Medicare HMO | Admitting: Vascular Surgery

## 2022-11-12 NOTE — Progress Notes (Unsigned)
Cardiology Office Note   Date:  11/15/2022   ID:  [name] [name] 14-Mar-1951, MRN [id number]  PCP:  Adaline Sill, NP  Cardiologist:   None Referring:  Adaline Sill, NP  No chief complaint on file.     History of Present Illness: [name] [name] is a 72 y.o. male who presents for evaluation of chest pain.  He was in the ED earlier this month.  This was at [facility].  I do see that he had an echocardiogram in December that demonstrated a well-preserved ejection fraction.  I saw hospitalization from February of last year at [facility] with a negative stress test.  When he presented to the ER this year he did not have any further testing.  He had chest pain but there was no objective evidence of ischemia.  I reviewed these records for this visit.  He is otherwise has no past cardiac history. He did have an echo in 2016.  This was essentially unremarkable.    He gets chest discomfort.  Has been going on for at least the last year.  It happens with emotional stress.  He says he has a lot of stress from his wife.  He is here today with his caregiver says she helps him because of joint problems but there is also mention of him having GI problems and vision disturbance.  He was able to do some walking this summer up to 2 miles without bringing on chest discomfort.  He has not done that in the winter.  He says that he gets emotionally stressed.  Gets 10 out of 10 substernal chest pressure like somebody squeezing.  There is no radiation to the jaw or to the arm.  It might last for an hour.  He will get nauseated.  He might get short of breath.  He is not describing PND or orthopnea.  Is not describing palpitations, presyncope or syncope.   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   Borderline diabetes mellitus    Depression with anxiety    Diabetes mellitus without complication (HCC)    GERD (gastroesophageal  reflux disease)    HOH (hard of hearing)    Hypertension    Obstructive sleep apnea on CPAP    Cannot afford it.   Stroke [facility])     Past Surgical History:  Procedure Laterality Date   CATARACT EXTRACTION W/PHACO Right 08/14/2022   Procedure: CATARACT EXTRACTION PHACO AND INTRAOCULAR LENS PLACEMENT ([facility]);  Surgeon: Baruch Goldmann, MD;  Location: [facility];  Service: Ophthalmology;  Laterality: Right;  CDE 12.51   RECTAL SURGERY     As an infant   TONSILLECTOMY     UMBILICAL HERNIA REPAIR       Current Outpatient Medications  Medication Sig Dispense Refill   amLODipine (NORVASC) 10 MG tablet 1 tablet Orally daily for 90 days     aspirin 81 MG chewable tablet Chew 162 mg by mouth daily.     atorvastatin (LIPITOR) 20 MG tablet Take 1 tablet (20 mg total) by mouth daily. 30 tablet 1   clopidogrel (PLAVIX) 75 MG tablet Take 1 tablet (75 mg total) by mouth daily. 30 tablet 1   HYDROcodone-acetaminophen (NORCO) 7.5-325 MG tablet 1 tablet as needed Orally bid prn for 30     JARDIANCE 25 MG TABS tablet Take 25 mg by mouth daily.  metFORMIN (GLUCOPHAGE) 1000 MG tablet TAKE ONE TABLET BY MOUTH TWICE DAILY WITH A MEAL. (MORNING ,EVENING) (Patient taking differently: Take 1,000 mg by mouth 2 (two) times daily with a meal.) 180 tablet 0   metoprolol tartrate (LOPRESSOR) 50 MG tablet Take 1 tablet (50 mg total) by mouth as directed. Take one tablet 2 hours before your CT scan 1 tablet 0   nitroGLYCERIN (NITROSTAT) 0.4 MG SL tablet Place 0.4 mg under the tongue every 5 (five) minutes as needed for chest pain.     potassium chloride (K-DUR,KLOR-CON) 10 MEQ tablet TAKE ONE TABLET BY MOUTH TWICE DAILY (MORNING ,EVENING) (Patient taking differently: Take 10 mEq by mouth 2 (two) times daily.) 60 tablet 5   RESTASIS MULTIDOSE 0.05 % ophthalmic emulsion Place 1 drop into both eyes daily.  3   TRESIBA FLEXTOUCH 100 UNIT/ML FlexTouch Pen SMARTSIG:20 Unit(s) SUB-Q Daily     furosemide (LASIX) 20 MG tablet TAKE  ONE TABLET BY MOUTH IN THE MORNING FOR BLOOD PRESSURE AND FLUID (MORNING) (Patient not taking: Reported on 11/15/2022) 30 tablet 5   HYDROcodone-acetaminophen (NORCO/VICODIN) 5-325 MG tablet Take 1 tablet by mouth daily as needed for severe pain. Reported on 05/08/2016 30 tablet 0   No current facility-administered medications for this visit.    Allergies:   Patient has no known allergies.    Social History:  The patient  reports that he has never smoked. He has never used smokeless tobacco. He reports that he does not currently use alcohol. He reports that he does not use drugs.   His only daughter died at age 85 from a car accident.  Family History:  The patient's family history includes Alzheimer's disease in an other family member; Cancer in his father, mother, and another family member; Coronary artery disease in an other family member; Dementia in an other family member; Diabetes in his father, mother, and another family member; Heart attack in an other family member; Lung cancer in an other family member; Sudden death in his sister.    ROS:  Please see the history of present illness.   Otherwise, review of systems are positive for none.   All other systems are reviewed and negative.    PHYSICAL EXAM: VS:  BP 128/80   Pulse 66   Ht '5\' 1"'$  (1.549 m)   Wt 183 lb (83 kg)   BMI 34.58 kg/m  , BMI Body mass index is 34.58 kg/m. GENERAL:  Well appearing HEENT:  Pupils equal round and reactive, fundi not visualized, oral mucosa unremarkable NECK:  No jugular venous distention, waveform within normal limits, carotid upstroke brisk and symmetric, no bruits, no thyromegaly LYMPHATICS:  No cervical, inguinal adenopathy LUNGS:  Clear to auscultation bilaterally BACK:  No CVA tenderness CHEST:  Unremarkable HEART:  PMI not displaced or sustained,S1 and S2 within normal limits, no S3, no S4, no clicks, no rubs, no murmurs ABD:  Flat, positive bowel sounds normal in frequency in pitch, no bruits,  no rebound, no guarding, no midline pulsatile mass, no hepatomegaly, no splenomegaly EXT:  2 plus pulses throughout, no edema, no cyanosis no clubbing SKIN:  No rashes no nodules NEURO:  Cranial nerves II through XII grossly intact, motor grossly intact throughout PSYCH:  Cognitively intact, oriented to person place and time    EKG:  EKG is ordered today. The ekg ordered today demonstrates sinus rhythm, rate 66, axis within normal limits, intervals within normal limits, no acute ST-T wave changes.   Recent Labs: No results  found for requested labs within last 365 days.    Lipid Panel    Component Value Date/Time   CHOL 148 12/22/2016 1107   TRIG 240 (H) 12/22/2016 1107   HDL 28 (L) 12/22/2016 1107   CHOLHDL 5.3 (H) 12/22/2016 1107   CHOLHDL 5.7 05/03/2015 1150   VLDL UNABLE TO CALCULATE IF TRIGLYCERIDE OVER 400 mg/dL 05/03/2015 1150   LDLCALC 72 12/22/2016 1107      Wt Readings from Last 3 Encounters:  11/15/22 183 lb (83 kg)  10/30/22 182 lb (82.6 kg)  08/14/22 187 lb (84.8 kg)      Other studies Reviewed: Additional studies/ records that were reviewed today include: Extensive review of outside hospital records. Review of the above records demonstrates:  Please see elsewhere in the note.     ASSESSMENT AND PLAN:  Precordial chest pain: Chest discomfort has some typical and some atypical symptoms.  I think the pretest probability of this representing anginal obstructive coronary disease is at least moderate.   I will order a coronary CT.  Type II DM: Patient is A1c is 7.6.  I will defer to his primary provider and patient knows that he is not on target.      Current medicines are reviewed at length with the patient today.  The patient does not have concerns regarding medicines.  The following changes have been made:  no change  Labs/ tests ordered today include:   Orders Placed This Encounter  Procedures   CT CORONARY MORPH W/CTA COR W/SCORE W/CA W/CM &/OR  WO/CM   EKG 12-Lead     Disposition:   FU with me based on the results of the above   Signed, Minus Breeding, MD  11/15/2022 12:34 PM    [place]

## 2022-11-14 ENCOUNTER — Telehealth: Payer: Self-pay | Admitting: Anesthesiology

## 2022-11-14 ENCOUNTER — Other Ambulatory Visit: Payer: Self-pay | Admitting: Neurology

## 2022-11-14 MED ORDER — CLOPIDOGREL BISULFATE 75 MG PO TABS: 75.0000 mg | ORAL_TABLET | Freq: Every day | ORAL | 1 refills | Status: AC

## 2022-11-14 NOTE — Telephone Encounter (Signed)
Per patient [name] [name], They are aware that per last visit Continue ASA '81mg'$  and Plavix '75mg'$  daily for 3 months (until about March 18), followed by ASA '81mg'$  daily monotherapy (discontinue Plavix)   Per Aide please send in Plavix, Patient is out of that now.

## 2022-11-14 NOTE — Telephone Encounter (Signed)
Pt called stating he has a question regarding his last visit with Dr Tomi Likens, he was told to stop taking a medication he does not recall what medication it is. Call back number is [phone].

## 2022-11-14 NOTE — Telephone Encounter (Signed)
Done

## 2022-11-15 ENCOUNTER — Encounter: Payer: Medicare HMO | Admitting: Vascular Surgery

## 2022-11-15 ENCOUNTER — Ambulatory Visit (INDEPENDENT_AMBULATORY_CARE_PROVIDER_SITE_OTHER): Payer: Medicare PPO | Admitting: Cardiology

## 2022-11-15 ENCOUNTER — Encounter: Payer: Self-pay | Admitting: Cardiology

## 2022-11-15 VITALS — BP 128/80 | HR 66 | Ht 61.0 in | Wt 183.0 lb

## 2022-11-15 DIAGNOSIS — R072 Precordial pain: Secondary | ICD-10-CM

## 2022-11-15 DIAGNOSIS — E118 Type 2 diabetes mellitus with unspecified complications: Secondary | ICD-10-CM | POA: Diagnosis not present

## 2022-11-15 DIAGNOSIS — G459 Transient cerebral ischemic attack, unspecified: Secondary | ICD-10-CM

## 2022-11-15 MED ORDER — METOPROLOL TARTRATE 50 MG PO TABS: 50.0000 mg | ORAL_TABLET | ORAL | 0 refills | Status: DC

## 2022-11-15 NOTE — Patient Instructions (Addendum)
Medication Instructions:  The current medical regimen is effective;  continue present plan and medications.  *If you need a refill on your cardiac medications before your next appointment, please call your pharmacy*   Lab Work: None today If you have labs (blood work) drawn today and your tests are completely normal, you will receive your results only by: [place] (if you have MyChart) OR A paper copy in the mail If you have any lab test that is abnormal or we need to change your treatment, we will call you to review the results.   Testing/Procedures:   Your cardiac CT will be scheduled at:   [facility] [address] [place], [place] [address] [phone]  Please arrive at the [facility] and Children's Entrance (Entrance C2) of [facility] 30 minutes prior to test start time. You can use the FREE valet parking offered at entrance C (encouraged to control the heart rate for the test)  Proceed to the [facility] Radiology Department (first floor) to check-in and test prep.  All radiology patients and guests should use entrance C2 at [facility], accessed from [facility], even though the hospital's physical address listed is [address]    Please follow these instructions carefully (unless otherwise directed):  Hold all erectile dysfunction medications at least 3 days (72 hrs) prior to test. (Ie viagra, cialis, sildenafil, tadalafil, etc) We will administer nitroglycerin during this exam.   On the Night Before the Test: Be sure to Drink plenty of water. Do not consume any caffeinated/decaffeinated beverages or chocolate 12 hours prior to your test. Do not take any antihistamines 12 hours prior to your test.  On the Day of the Test: Drink plenty of water until 1 hour prior to the test. Do not eat any food 1 hour prior to test. You may take your regular medications prior to the test.  Take metoprolol (Lopressor) two  hours prior to test. HOLD Furosemide/Hydrochlorothiazide morning of the test.  After the Test: Drink plenty of water. After receiving IV contrast, you may experience a mild flushed feeling. This is normal. On occasion, you may experience a mild rash up to 24 hours after the test. This is not dangerous. If this occurs, you can take Benadryl 25 mg and increase your fluid intake. If you experience trouble breathing, this can be serious. If it is severe call 911 IMMEDIATELY. If it is mild, please call our office. If you take any of these medications: Glipizide/Metformin, Avandament, Glucavance, please do not take 48 hours after completing test unless otherwise instructed.  We will call to schedule your test 2-4 weeks out understanding that some insurance companies will need an authorization prior to the service being performed.   For non-scheduling related questions, please contact the cardiac imaging nurse navigator should you have any questions/concerns: Marchia Bond, Cardiac Imaging Nurse Navigator Gordy Clement, Cardiac Imaging Nurse Navigator [facility] Direct Office Dial: [phone]   For scheduling needs, including cancellations and rescheduling, please call [place], [phone].   Follow-Up: At [facility], you and your health needs are our priority.  As part of our continuing mission to provide you with exceptional heart care, we have created designated Provider Care Teams.  These Care Teams include your primary Cardiologist (physician) and Advanced Practice Providers (APPs -  Physician Assistants and Nurse Practitioners) who all work together to provide you with the care you need, when you need it.  We recommend  signing up for the patient portal called "MyChart".  Sign up information is provided on this After Visit Summary.  MyChart is used to connect with patients for Virtual Visits (Telemedicine).  Patients are able to view lab/test results,  encounter notes, upcoming appointments, etc.  Non-urgent messages can be sent to your provider as well.   To learn more about what you can do with MyChart, go to [email or web address].    Your next appointment:   1 year(s)  Provider:   Minus Breeding, MD

## 2022-11-30 ENCOUNTER — Ambulatory Visit (HOSPITAL_COMMUNITY): Payer: Medicare PPO

## 2022-12-01 ENCOUNTER — Telehealth (HOSPITAL_COMMUNITY): Payer: Self-pay | Admitting: Emergency Medicine

## 2022-12-01 NOTE — Telephone Encounter (Signed)
Attempted to call patient regarding upcoming cardiac CT appointment. °Left message on voicemail with name and callback number °Milady Fleener RN Navigator Cardiac Imaging °[facility] °[phone] Office °[phone] Cell ° °

## 2022-12-04 ENCOUNTER — Other Ambulatory Visit: Payer: Self-pay | Admitting: Internal Medicine

## 2022-12-04 ENCOUNTER — Ambulatory Visit (HOSPITAL_COMMUNITY)
Admission: RE | Admit: 2022-12-04 | Discharge: 2022-12-04 | Disposition: A | Discharge status: Home / Self Care | Payer: 59 | Source: Ambulatory Visit | Attending: Cardiology | Admitting: Cardiology

## 2022-12-04 DIAGNOSIS — I2510 Atherosclerotic heart disease of native coronary artery without angina pectoris: Secondary | ICD-10-CM

## 2022-12-04 DIAGNOSIS — R931 Abnormal findings on diagnostic imaging of heart and coronary circulation: Secondary | ICD-10-CM | POA: Insufficient documentation

## 2022-12-04 DIAGNOSIS — R072 Precordial pain: Secondary | ICD-10-CM | POA: Diagnosis present

## 2022-12-04 MED ORDER — IOHEXOL 350 MG/ML SOLN
95.0000 mL | Freq: Once | INTRAVENOUS | Status: AC | PRN
  Administered 2022-12-04: 95 mL via INTRAVENOUS

## 2022-12-04 MED ORDER — NITROGLYCERIN 0.4 MG SL SUBL
SUBLINGUAL_TABLET | SUBLINGUAL | Status: AC
  Filled 2022-12-04: qty 2

## 2022-12-04 MED ORDER — NITROGLYCERIN 0.4 MG SL SUBL
0.8000 mg | SUBLINGUAL_TABLET | Freq: Once | SUBLINGUAL | Status: AC
  Administered 2022-12-04: 0.8 mg via SUBLINGUAL

## 2022-12-05 ENCOUNTER — Ambulatory Visit (HOSPITAL_BASED_OUTPATIENT_CLINIC_OR_DEPARTMENT_OTHER)
Admission: RE | Admit: 2022-12-05 | Discharge: 2022-12-05 | Disposition: A | Discharge status: Home / Self Care | Payer: 59 | Source: Ambulatory Visit | Attending: Internal Medicine | Admitting: Internal Medicine

## 2022-12-05 DIAGNOSIS — R931 Abnormal findings on diagnostic imaging of heart and coronary circulation: Secondary | ICD-10-CM

## 2023-01-05 ENCOUNTER — Other Ambulatory Visit: Payer: Self-pay | Admitting: Neurology

## 2023-01-31 ENCOUNTER — Ambulatory Visit: Payer: 59 | Admitting: Cardiology

## 2023-02-12 DIAGNOSIS — I2510 Atherosclerotic heart disease of native coronary artery without angina pectoris: Secondary | ICD-10-CM | POA: Insufficient documentation

## 2023-02-12 NOTE — Progress Notes (Unsigned)
  Cardiology Office Note:   Date:  02/14/2023  ID:  [name] [name], DOB 07-13-1951, MRN [id number]  History of Present Illness:   [name] [name] is a 72 y.o. male who presents for evaluation of chest pain.  He was in the ED in Jan 2024.  This was at [facility].  He had an echocardiogram in December 2023 that demonstrated a well-preserved ejection fraction.  He was hospitalized from February of 2023 at  [facility] with a negative stress test.  When he presented to the ER this year he did not have any further testing.  When I saw him I sent him for CT and he had non obstructive plaque.  The LAD had 50 - 69% stenosis.    Since I last saw him he has had no new cardiovascular complaints.  He denies any chest pressure, neck or arm discomfort other than some vague discomfort when he starts walking it goes away with exercise.  This sounds musculoskeletal in his forearms.  It has been very stable pattern.  He thinks he can exercise routinely with his 2 mile walk on most days and feels good with this without cardiovascular symptoms.  He has been trying to avoid emotional stress was causing some symptoms.  He is here with his caregiver.   ROS: As stated in the HPI and negative for all other systems.  Studies Reviewed:    EKG: Sinus bradycardia, rate 52, axis within normal limits, intervals within normal limits, no acute ST-T wave changes.   Risk Assessment/Calculations:         Physical Exam:   VS:  BP (!) 142/86   Pulse (!) 52   Ht  (1.549 m)   Wt 185 lb (83.9 kg)   BMI 34.96 kg/m    Wt Readings from Last 3 Encounters:  02/14/23 185 lb (83.9 kg)  11/15/22 183 lb (83 kg)  10/30/22 182 lb (82.6 kg)     GEN: Well nourished, well developed in no acute distress NECK: No JVD; No carotid bruits CARDIAC: RRR, no murmurs, rubs, gallops RESPIRATORY:  Clear to auscultation without rales, wheezing or rhonchi  ABDOMEN: Soft, non-tender, non-distended EXTREMITIES:  No edema; No deformity    ASSESSMENT AND PLAN:   Precordial chest pain:  He had non obstructive CAD.  We had a long discussion about nonobstructive coronary artery disease and medical management.  He is not having any unstable symptoms.  He is physically active.  I think he understands when to let me know if he starts having any symptoms.  Otherwise we will participate in aggressive risk reduction.    Type II DM: Patient is A1c is 8.7.  I given him written instructions to get blood work to include an A1c.  He is seen his primary this week and we discussed the fact he will probably need change of his medications to get him to target.  We spent a long time talking about diet and the importance.  I think he would be very reasonable for Ozempic but I will defer to Rebekah Chesterfield, NP   Dyslipidemia: His LDL was 72 and I think the target should be 50s.  I have asked for fasting lipid profile and LP(a) with results sent to me.  Again we talked about diet.      Signed, Rollene Rotunda, MD

## 2023-02-14 ENCOUNTER — Encounter: Payer: Self-pay | Admitting: Cardiology

## 2023-02-14 ENCOUNTER — Ambulatory Visit (INDEPENDENT_AMBULATORY_CARE_PROVIDER_SITE_OTHER): Payer: 59 | Admitting: Cardiology

## 2023-02-14 VITALS — BP 142/86 | HR 52 | Ht 61.0 in | Wt 185.0 lb

## 2023-02-14 DIAGNOSIS — I2510 Atherosclerotic heart disease of native coronary artery without angina pectoris: Secondary | ICD-10-CM | POA: Diagnosis not present

## 2023-02-14 DIAGNOSIS — E118 Type 2 diabetes mellitus with unspecified complications: Secondary | ICD-10-CM | POA: Diagnosis not present

## 2023-02-14 NOTE — Patient Instructions (Signed)
Medication Instructions:  The current medical regimen is effective;  continue present plan and medications.  *If you need a refill on your cardiac medications before your next appointment, please call your pharmacy*   Lab Work: Please have blood work at your primary care doctor's office. (Lipid, LPa, HA1c) If you have labs (blood work) drawn today and your tests are completely normal, you will receive your results only by: MyChart Message (if you have MyChart) OR A paper copy in the mail If you have any lab test that is abnormal or we need to change your treatment, we will call you to review the results.  Please keep a blood pressure diary - twice a day for 2 weeks.  You may return it to the [place] office.  Follow-Up: At [facility], you and your health needs are our priority.  As part of our continuing mission to provide you with exceptional heart care, we have created designated Provider Care Teams.  These Care Teams include your primary Cardiologist (physician) and Advanced Practice Providers (APPs -  Physician Assistants and Nurse Practitioners) who all work together to provide you with the care you need, when you need it.  We recommend signing up for the patient portal called "MyChart".  Sign up information is provided on this After Visit Summary.  MyChart is used to connect with patients for Virtual Visits (Telemedicine).  Patients are able to view lab/test results, encounter notes, upcoming appointments, etc.  Non-urgent messages can be sent to your provider as well.   To learn more about what you can do with MyChart, go to [email or web address].    Your next appointment:   1 year(s)  Provider:   Rollene Rotunda, MD

## 2023-03-05 NOTE — Progress Notes (Deleted)
NEUROLOGY FOLLOW UP OFFICE NOTE  [name] [name] [id number]  Assessment/Plan:   Vision loss - as he is already blind in his left eye, it is difficult to ascertain if his symptoms were related to right amaurosis fugax or bilateral PCA hypoperfusion.  I suspect he had amaurosis fugax in the right eye rather than transient bilateral occipital hypoperfusion. Bilateral P2 posterior cerebral artery stenosis  Hypertension - elevated today.  Advised to follow up with PCP Type 2 diabetes   Continue ASA 81mg  and Plavix 75mg  daily for 3 months (until about March 18), followed by ASA 81mg  daily monotherapy (discontinue Plavix) Secondary stroke prevention as otherwise managed by PCP: Continue statin therapy.  LDL goal less than 70 Hgb O1H goal less than 7 Normotensive blood pressure  Would check 2 week cardiac event monitor Follow up 4 to 5 months. 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  UPDATE: Current medication:  ***  2 week cardiac event monitor ***   HISTORY:  On 10/09/2022, patient had sudden painless dark vision loss in the right eye lasting 20-30 minutes.  No associated headache, facial droop, unilateral numbness or weakness.  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  He saw the ophthalmologist whose exam was unremarkable but told him to go the ED.  He was evaluated at [facility].  CT and follow up MRI of brain revealed no acute abnormalities.  CTA of head and neck revealed bilateral severe P2 PCA stenosis but otherwise no LVO or stenosis.  Sed rate was 7.  Patient declined admission for TIA workup.  Prior to hospitalization, he was taking ASA 81mg  but not consistently.  He was discharged on ASA 81mg , Plavix 75mg  and atorvastatin 80mg  daily.  He had an  outpatient TTE on 10/20/2022 which revealed LVEF >55%.  Atorvastatin was subsequently changed to simvastatin 20mg  daily.  PAST MEDICAL HISTORY: 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   Borderline diabetes mellitus    Depression with anxiety    Diabetes mellitus without complication (HCC)    GERD (gastroesophageal reflux disease)    HOH (hard of hearing)    Hypertension    Obstructive sleep apnea on CPAP    Cannot afford it.   Stroke [facility])     MEDICATIONS: Current Outpatient Medications on File Prior to Visit  Medication Sig Dispense Refill   amLODipine (NORVASC) 10 MG tablet 1 tablet Orally daily for 90 days     aspirin 81 MG chewable tablet Chew 162 mg by mouth daily.     atorvastatin (LIPITOR) 20 MG tablet Take 1 tablet (20 mg total) by mouth daily. 30 tablet 1   clopidogrel (PLAVIX) 75 MG tablet Take 1 tablet (75 mg total) by mouth daily. 30 tablet 1   furosemide (LASIX) 20 MG tablet TAKE ONE TABLET BY MOUTH IN THE MORNING FOR BLOOD PRESSURE AND FLUID (MORNING) 30 tablet 5   HYDROcodone-acetaminophen (NORCO) 7.5-325 MG tablet 1 tablet as needed Orally bid prn for 30     HYDROcodone-acetaminophen (NORCO/VICODIN) 5-325 MG tablet Take 1 tablet by mouth daily as needed for severe pain. Reported on 05/08/2016 30 tablet 0   JARDIANCE 25 MG TABS  tablet Take 25 mg by mouth daily.     metFORMIN (GLUCOPHAGE) 1000 MG tablet TAKE ONE TABLET BY MOUTH TWICE DAILY WITH A MEAL. (MORNING ,EVENING) (Patient taking differently: Take 1,000 mg by mouth 2 (two) times daily with a meal.) 180 tablet 0   nitroGLYCERIN (NITROSTAT) 0.4 MG SL tablet Place 0.4 mg under the tongue every 5 (five) minutes as needed for chest pain.     potassium chloride (K-DUR,KLOR-CON) 10 MEQ tablet TAKE ONE TABLET BY MOUTH TWICE DAILY (MORNING ,EVENING) (Patient taking differently: Take 10 mEq by mouth 2 (two) times daily.) 60 tablet 5   TRESIBA FLEXTOUCH 100 UNIT/ML  FlexTouch Pen SMARTSIG:20 Unit(s) SUB-Q Daily     No current facility-administered medications on file prior to visit.    ALLERGIES: No Known Allergies  FAMILY HISTORY: Family History  Problem Relation Age of Onset   Sudden death Sister    Dementia Other    Alzheimer's disease Other    Diabetes Other    Coronary artery disease Other    Heart attack Other    Cancer Other    Lung cancer Other    Cancer Mother        lung   Diabetes Mother    Cancer Father    Diabetes Father    Colon cancer Neg Hx       Objective:  *** General: No acute distress.  Patient appears ***-groomed.   Head:  Normocephalic/atraumatic Eyes:  Fundi examined but not visualized Neck: supple, no paraspinal tenderness, full range of motion Heart:  Regular rate and rhythm Lungs:  Clear to auscultation bilaterally Back: No paraspinal tenderness Neurological Exam: alert and oriented to person, place, and time.  Speech fluent and not dysarthric, language intact.  CN II-XII intact. Bulk and tone normal, muscle strength 5/5 throughout.  Sensation to light touch intact.  Deep tendon reflexes 2+ throughout, toes downgoing.  Finger to nose testing intact.  Gait normal, Romberg negative.   Shon Millet, DO  CC: ***

## 2023-03-07 ENCOUNTER — Ambulatory Visit: Payer: 59 | Admitting: Neurology

## 2023-03-07 ENCOUNTER — Encounter: Payer: Self-pay | Admitting: Neurology

## 2023-05-14 NOTE — Progress Notes (Deleted)
NEUROLOGY FOLLOW UP OFFICE NOTE  [name] [name] [id number]  Assessment/Plan:   Vision loss - as he is already blind in his left eye, it is difficult to ascertain if his symptoms were related to right amaurosis fugax or bilateral PCA hypoperfusion.  I suspect he had amaurosis fugax in the right eye rather than transient bilateral occipital hypoperfusion. Bilateral P2 posterior cerebral artery stenosis  Hypertension - elevated today.  Advised to follow up with PCP Type 2 diabetes   Secondary stroke prevention as otherwise managed by PCP: ASA 81mg  daily Continue statin therapy.  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  UPDATE: Current medications:  ***  2 week cardiac event monitor?  ***   HISTORY: On 10/09/2022, patient had sudden painless dark vision loss in the right eye lasting 20-30 minutes.  No associated headache, facial droop, unilateral numbness or weakness.  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  He saw the ophthalmologist whose exam was unremarkable but told him to go the ED.  He was evaluated at [facility].  CT and follow up MRI of brain revealed no acute abnormalities.  CTA of head and neck revealed bilateral severe P2 PCA stenosis but otherwise no LVO or stenosis.  Sed rate was 7.  Patient declined admission for TIA workup.  Prior to hospitalization, he was taking ASA 81mg  but not consistently.  He was discharged on ASA 81mg , Plavix 75mg  and atorvastatin 80mg  daily.  He had an outpatient TTE on 10/20/2022 which revealed LVEF >55%.  Atorvastatin was subsequently changed to simvastatin 20mg  daily.    PAST MEDICAL  HISTORY: 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   Borderline diabetes mellitus    Depression with anxiety    Diabetes mellitus without complication (HCC)    GERD (gastroesophageal reflux disease)    HOH (hard of hearing)    Hypertension    Obstructive sleep apnea on CPAP    Cannot afford it.   Stroke [facility])     MEDICATIONS: Current Outpatient Medications on File Prior to Visit  Medication Sig Dispense Refill   amLODipine (NORVASC) 10 MG tablet 1 tablet Orally daily for 90 days     aspirin 81 MG chewable tablet Chew 162 mg by mouth daily.     atorvastatin (LIPITOR) 20 MG tablet Take 1 tablet (20 mg total) by mouth daily. 30 tablet 1   clopidogrel (PLAVIX) 75 MG tablet Take 1 tablet (75 mg total) by mouth daily. 30 tablet 1   furosemide (LASIX) 20 MG tablet TAKE ONE TABLET BY MOUTH IN THE MORNING FOR BLOOD PRESSURE AND FLUID (MORNING) 30 tablet 5   HYDROcodone-acetaminophen (NORCO) 7.5-325 MG tablet 1 tablet as needed Orally bid prn for 30     HYDROcodone-acetaminophen (NORCO/VICODIN) 5-325 MG tablet Take 1 tablet by mouth daily as needed for severe pain. Reported on 05/08/2016 30 tablet 0   JARDIANCE 25 MG TABS tablet Take 25 mg by mouth daily.     metFORMIN (GLUCOPHAGE) 1000 MG tablet TAKE ONE TABLET BY MOUTH TWICE  DAILY WITH A MEAL. (MORNING ,EVENING) (Patient taking differently: Take 1,000 mg by mouth 2 (two) times daily with a meal.) 180 tablet 0   nitroGLYCERIN (NITROSTAT) 0.4 MG SL tablet Place 0.4 mg under the tongue every 5 (five) minutes as needed for chest pain.     potassium chloride (K-DUR,KLOR-CON) 10 MEQ tablet TAKE ONE TABLET BY MOUTH TWICE DAILY (MORNING ,EVENING) (Patient taking differently: Take 10 mEq by mouth 2 (two) times daily.) 60 tablet 5   TRESIBA FLEXTOUCH 100 UNIT/ML FlexTouch Pen SMARTSIG:20 Unit(s) SUB-Q Daily     No current facility-administered medications on file prior to visit.     ALLERGIES: No Known Allergies  FAMILY HISTORY: Family History  Problem Relation Age of Onset   Sudden death Sister    Dementia Other    Alzheimer's disease Other    Diabetes Other    Coronary artery disease Other    Heart attack Other    Cancer Other    Lung cancer Other    Cancer Mother        lung   Diabetes Mother    Cancer Father    Diabetes Father    Colon cancer Neg Hx       Objective:  *** General: No acute distress.  Patient appears well-groomed.   Head:  Normocephalic/atraumatic Eyes:  Fundi examined but not visualized Neck: supple, no paraspinal tenderness, full range of motion Heart:  Regular rate and rhythm Neurological Exam: alert and oriented.  Speech fluent and not dysarthric, language intact.  CN II-XII intact. Bulk and tone normal, muscle strength 5/5 throughout.  Sensation to light touch intact.  Deep tendon reflexes 2+ throughout, toes downgoing.  Finger to nose testing intact.  Gait normal, Romberg negative.   Shon Millet, DO  CC: ***

## 2023-05-15 ENCOUNTER — Ambulatory Visit: Payer: 59 | Admitting: Neurology

## 2023-11-18 ENCOUNTER — Encounter (HOSPITAL_COMMUNITY): Payer: Self-pay

## 2023-11-18 ENCOUNTER — Emergency Department (HOSPITAL_COMMUNITY): Payer: 59

## 2023-11-18 ENCOUNTER — Inpatient Hospital Stay (HOSPITAL_COMMUNITY)
Admission: EM | Admit: 2023-11-18 | Discharge: 2023-11-20 | DRG: 392 | Disposition: A | Discharge status: Home Health | Payer: 59 | Attending: Family Medicine | Admitting: Family Medicine

## 2023-11-18 ENCOUNTER — Other Ambulatory Visit: Payer: Self-pay

## 2023-11-18 DIAGNOSIS — R109 Unspecified abdominal pain: Secondary | ICD-10-CM | POA: Diagnosis present

## 2023-11-18 DIAGNOSIS — K219 Gastro-esophageal reflux disease without esophagitis: Secondary | ICD-10-CM | POA: Diagnosis present

## 2023-11-18 DIAGNOSIS — Z8249 Family history of ischemic heart disease and other diseases of the circulatory system: Secondary | ICD-10-CM

## 2023-11-18 DIAGNOSIS — Z794 Long term (current) use of insulin: Secondary | ICD-10-CM

## 2023-11-18 DIAGNOSIS — G4733 Obstructive sleep apnea (adult) (pediatric): Secondary | ICD-10-CM | POA: Diagnosis present

## 2023-11-18 DIAGNOSIS — R1084 Generalized abdominal pain: Secondary | ICD-10-CM | POA: Diagnosis not present

## 2023-11-18 DIAGNOSIS — Z7902 Long term (current) use of antithrombotics/antiplatelets: Secondary | ICD-10-CM

## 2023-11-18 DIAGNOSIS — I10 Essential (primary) hypertension: Secondary | ICD-10-CM | POA: Diagnosis present

## 2023-11-18 DIAGNOSIS — Z79899 Other long term (current) drug therapy: Secondary | ICD-10-CM

## 2023-11-18 DIAGNOSIS — Z7984 Long term (current) use of oral hypoglycemic drugs: Secondary | ICD-10-CM

## 2023-11-18 DIAGNOSIS — R066 Hiccough: Secondary | ICD-10-CM | POA: Diagnosis present

## 2023-11-18 DIAGNOSIS — E1165 Type 2 diabetes mellitus with hyperglycemia: Secondary | ICD-10-CM | POA: Diagnosis present

## 2023-11-18 DIAGNOSIS — R1110 Vomiting, unspecified: Secondary | ICD-10-CM | POA: Diagnosis not present

## 2023-11-18 DIAGNOSIS — Z8673 Personal history of transient ischemic attack (TIA), and cerebral infarction without residual deficits: Secondary | ICD-10-CM

## 2023-11-18 DIAGNOSIS — E782 Mixed hyperlipidemia: Secondary | ICD-10-CM | POA: Diagnosis present

## 2023-11-18 DIAGNOSIS — R112 Nausea with vomiting, unspecified: Secondary | ICD-10-CM | POA: Diagnosis not present

## 2023-11-18 DIAGNOSIS — H5462 Unqualified visual loss, left eye, normal vision right eye: Secondary | ICD-10-CM | POA: Diagnosis present

## 2023-11-18 DIAGNOSIS — R1013 Epigastric pain: Principal | ICD-10-CM

## 2023-11-18 DIAGNOSIS — I119 Hypertensive heart disease without heart failure: Secondary | ICD-10-CM | POA: Diagnosis present

## 2023-11-18 DIAGNOSIS — I2510 Atherosclerotic heart disease of native coronary artery without angina pectoris: Secondary | ICD-10-CM | POA: Diagnosis present

## 2023-11-18 DIAGNOSIS — Z820 Family history of epilepsy and other diseases of the nervous system: Secondary | ICD-10-CM

## 2023-11-18 DIAGNOSIS — Z7985 Long-term (current) use of injectable non-insulin antidiabetic drugs: Secondary | ICD-10-CM

## 2023-11-18 DIAGNOSIS — K2100 Gastro-esophageal reflux disease with esophagitis, without bleeding: Secondary | ICD-10-CM | POA: Diagnosis present

## 2023-11-18 DIAGNOSIS — Z801 Family history of malignant neoplasm of trachea, bronchus and lung: Secondary | ICD-10-CM

## 2023-11-18 DIAGNOSIS — J90 Pleural effusion, not elsewhere classified: Secondary | ICD-10-CM | POA: Diagnosis present

## 2023-11-18 DIAGNOSIS — F32A Depression, unspecified: Secondary | ICD-10-CM | POA: Diagnosis present

## 2023-11-18 DIAGNOSIS — K209 Esophagitis, unspecified without bleeding: Secondary | ICD-10-CM | POA: Diagnosis present

## 2023-11-18 DIAGNOSIS — F419 Anxiety disorder, unspecified: Secondary | ICD-10-CM | POA: Diagnosis present

## 2023-11-18 DIAGNOSIS — R531 Weakness: Secondary | ICD-10-CM | POA: Diagnosis present

## 2023-11-18 DIAGNOSIS — Z833 Family history of diabetes mellitus: Secondary | ICD-10-CM

## 2023-11-18 DIAGNOSIS — Z7982 Long term (current) use of aspirin: Secondary | ICD-10-CM

## 2023-11-18 LAB — COMPREHENSIVE METABOLIC PANEL
ALT: 25 U/L (ref 0–44)
AST: 20 U/L (ref 15–41)
Albumin: 4 g/dL (ref 3.5–5.0)
Alkaline Phosphatase: 62 U/L (ref 38–126)
Anion gap: 11 (ref 5–15)
BUN: 15 mg/dL (ref 8–23)
CO2: 24 mmol/L (ref 22–32)
Calcium: 8.8 mg/dL — ABNORMAL LOW (ref 8.9–10.3)
Chloride: 102 mmol/L (ref 98–111)
Creatinine, Ser: 1 mg/dL (ref 0.61–1.24)
GFR, Estimated: >60 mL/min (ref >60)
Glucose, Bld: 193 mg/dL — ABNORMAL HIGH (ref 70–99)
Potassium: 3.5 mmol/L (ref 3.5–5.1)
Sodium: 137 mmol/L (ref 135–145)
Total Bilirubin: 3.1 mg/dL — ABNORMAL HIGH (ref 0.0–1.2)
Total Protein: 7.3 g/dL (ref 6.5–8.1)

## 2023-11-18 LAB — CBC WITH DIFFERENTIAL/PLATELET
Abs Immature Granulocytes: 0.06 10*3/uL (ref 0.00–0.07)
Basophils Absolute: 0.1 10*3/uL (ref 0.0–0.1)
Basophils Relative: 1 %
Eosinophils Absolute: 0.1 10*3/uL (ref 0.0–0.5)
Eosinophils Relative: 1 %
HCT: 49 % (ref 39.0–52.0)
Hemoglobin: 16.5 g/dL (ref 13.0–17.0)
Immature Granulocytes: 1 %
Lymphocytes Relative: 12 %
Lymphs Abs: 1.5 10*3/uL (ref 0.7–4.0)
MCH: 28.5 pg (ref 26.0–34.0)
MCHC: 33.7 g/dL (ref 30.0–36.0)
MCV: 84.6 fL (ref 80.0–100.0)
Monocytes Absolute: 0.8 10*3/uL (ref 0.1–1.0)
Monocytes Relative: 6 %
Neutro Abs: 10.1 10*3/uL — ABNORMAL HIGH (ref 1.7–7.7)
Neutrophils Relative %: 79 %
Platelets: 211 10*3/uL (ref 150–400)
RBC: 5.79 MIL/uL (ref 4.22–5.81)
RDW: 13.9 % (ref 11.5–15.5)
WBC: 12.6 10*3/uL — ABNORMAL HIGH (ref 4.0–10.5)
nRBC: 0 % (ref 0.0–0.2)

## 2023-11-18 LAB — PROTIME-INR
INR: 1 (ref 0.8–1.2)
Prothrombin Time: 13.6 s (ref 11.4–15.2)

## 2023-11-18 LAB — POC OCCULT BLOOD, ED

## 2023-11-18 LAB — TYPE AND SCREEN
ABO/RH(D): O POS
Antibody Screen: NEGATIVE

## 2023-11-18 MED ORDER — SODIUM CHLORIDE 0.9 % IV BOLUS
500.0000 mL | Freq: Once | INTRAVENOUS | Status: AC
  Administered 2023-11-18: 500 mL via INTRAVENOUS

## 2023-11-18 MED ORDER — IOHEXOL 300 MG/ML  SOLN
100.0000 mL | Freq: Once | INTRAMUSCULAR | Status: AC | PRN
  Administered 2023-11-18: 100 mL via INTRAVENOUS

## 2023-11-18 MED ORDER — PROMETHAZINE HCL 25 MG/ML IJ SOLN
INTRAMUSCULAR | Status: AC
  Filled 2023-11-18: qty 1

## 2023-11-18 MED ORDER — MORPHINE SULFATE (PF) 4 MG/ML IV SOLN
4.0000 mg | Freq: Once | INTRAVENOUS | Status: AC
  Administered 2023-11-18: 4 mg via INTRAVENOUS
  Filled 2023-11-18: qty 1

## 2023-11-18 MED ORDER — SODIUM CHLORIDE 0.9 % IV SOLN
12.5000 mg | Freq: Once | INTRAVENOUS | Status: AC
  Administered 2023-11-18: 12.5 mg via INTRAVENOUS
  Filled 2023-11-18: qty 0.5

## 2023-11-18 MED ORDER — PANTOPRAZOLE SODIUM 40 MG IV SOLR
40.0000 mg | Freq: Once | INTRAVENOUS | Status: AC
  Administered 2023-11-18: 40 mg via INTRAVENOUS
  Filled 2023-11-18: qty 10

## 2023-11-18 MED ORDER — ALUM & MAG HYDROXIDE-SIMETH 200-200-20 MG/5ML PO SUSP
30.0000 mL | Freq: Once | ORAL | Status: AC
  Administered 2023-11-18: 30 mL via ORAL
  Filled 2023-11-18: qty 30

## 2023-11-18 MED ORDER — ONDANSETRON HCL 4 MG/2ML IJ SOLN
4.0000 mg | Freq: Once | INTRAMUSCULAR | Status: AC
  Administered 2023-11-18: 4 mg via INTRAVENOUS
  Filled 2023-11-18: qty 2

## 2023-11-18 NOTE — ED Notes (Signed)
Patient was given a cup of water.

## 2023-11-18 NOTE — ED Provider Notes (Signed)
[place] [facility]PART[facility] AT [facility] Provider Note   CSN: [id number] Arrival date & time: 11/18/23  1826     History  Chief Complaint  Patient presents with   Emesis    RENAN DANESE is a 73 y.o. male.  Pt is a 73 yo male with pmhx significant for htn, dm, retinal coloboma (blind in left eye), GERD, OSA, anxiety, depression, and hoh.  Pt said he started vomiting this am and said it's been black.  Pt does take plavix.  Pt said he had something similar happen to him about 5 years, but does not know what he was told it was.  Nothing in epic regarding this.  Pt has diffuse abd pain.  No fevers.        Home Medications Prior to Admission medications   Medication Sig Start Date End Date Taking? Authorizing Provider  amLODipine (NORVASC) 10 MG tablet 1 tablet Orally daily for 90 days    [provider]  aspirin 81 MG chewable tablet Chew 162 mg by mouth daily.    [provider]  atorvastatin (LIPITOR) 20 MG tablet Take 1 tablet (20 mg total) by mouth daily. 06/14/21   Vassie Loll, MD  clopidogrel (PLAVIX) 75 MG tablet Take 1 tablet (75 mg total) by mouth daily. 11/14/22   Everlena Cooper, Adam R, DO  furosemide (LASIX) 20 MG tablet TAKE ONE TABLET BY MOUTH IN THE MORNING FOR BLOOD PRESSURE AND FLUID (MORNING) 01/18/17   Dettinger, Elige Radon, MD  HYDROcodone-acetaminophen (NORCO) 7.5-325 MG tablet 1 tablet as needed Orally bid prn for 30 09/06/21   [provider]  HYDROcodone-acetaminophen (NORCO/VICODIN) 5-325 MG tablet Take 1 tablet by mouth daily as needed for severe pain. Reported on 05/08/2016 12/22/16   Dettinger, Elige Radon, MD  JARDIANCE 25 MG TABS tablet Take 25 mg by mouth daily. 06/02/21   [provider]  metFORMIN (GLUCOPHAGE) 1000 MG tablet TAKE ONE TABLET BY MOUTH TWICE DAILY WITH A MEAL. (MORNING ,EVENING) Patient taking differently: Take 1,000 mg by mouth 2 (two) times daily with a meal. 03/14/17   Dettinger, Elige Radon, MD   nitroGLYCERIN (NITROSTAT) 0.4 MG SL tablet Place 0.4 mg under the tongue every 5 (five) minutes as needed for chest pain.    [provider]  potassium chloride (K-DUR,KLOR-CON) 10 MEQ tablet TAKE ONE TABLET BY MOUTH TWICE DAILY (MORNING ,EVENING) Patient taking differently: Take 10 mEq by mouth 2 (two) times daily. 01/18/17   Dettinger, Elige Radon, MD  TRESIBA FLEXTOUCH 100 UNIT/ML FlexTouch Pen SMARTSIG:20 Unit(s) SUB-Q Daily 09/01/21   [provider]      Allergies    Patient has no known allergies.    Review of Systems   Review of Systems  Gastrointestinal:  Positive for abdominal pain and vomiting.  All other systems reviewed and are negative.   Physical Exam Updated Vital Signs BP (!) 163/88   Pulse 75   Temp 98.2 F (36.8 C) (Oral)   Resp 11   Ht 5\' 1"  (1.549 m)   Wt 67.1 kg   SpO2 97%   BMI 27.96 kg/m  Physical Exam Vitals and nursing note reviewed.  Constitutional:      Appearance: Normal appearance. He is obese.  HENT:     Head: Normocephalic and atraumatic.     Right Ear: External ear normal.     Left Ear: External ear normal.     Nose: Nose normal.     Mouth/Throat:  Mouth: Mucous membranes are dry.  Eyes:     Comments: Left eye blind  Cardiovascular:     Rate and Rhythm: Normal rate and regular rhythm.     Pulses: Normal pulses.     Heart sounds: Normal heart sounds.  Pulmonary:     Effort: Pulmonary effort is normal.     Breath sounds: Normal breath sounds.  Abdominal:     General: Abdomen is flat. Bowel sounds are normal.     Palpations: Abdomen is soft.     Tenderness: There is generalized abdominal tenderness.  Genitourinary:    Rectum: Guaiac result negative.     Comments: Stool is normal color Musculoskeletal:        General: Normal range of motion.     Cervical back: Normal range of motion and neck supple.  Skin:    General: Skin is warm.     Capillary Refill: Capillary refill takes less than 2 seconds.   Neurological:     General: No focal deficit present.     Mental Status: He is alert and oriented to person, place, and time.  Psychiatric:        Mood and Affect: Mood normal.        Behavior: Behavior normal.     ED Results / Procedures / Treatments   Labs (all labs ordered are listed, but only abnormal results are displayed) Labs Reviewed  COMPREHENSIVE METABOLIC PANEL - Abnormal; Notable for the following components:      Result Value   Glucose, Bld 193 (*)    Calcium 8.8 (*)    Total Bilirubin 3.1 (*)    All other components within normal limits  CBC WITH DIFFERENTIAL/PLATELET - Abnormal; Notable for the following components:   WBC 12.6 (*)    Neutro Abs 10.1 (*)    All other components within normal limits  PROTIME-INR  POC OCCULT BLOOD, ED  TYPE AND SCREEN    EKG EKG Interpretation Date/Time:  Sunday November 18 2023 18:35:07 EST Ventricular Rate:  94 PR Interval:  164 QRS Duration:  81 QT Interval:  342 QTC Calculation: 428 R Axis:   51  Text Interpretation: Sinus rhythm Nonspecific T abnormalities, inferior leads No significant change since last tracing Confirmed by Jacalyn Lefevre [phone]) on 11/18/2023 7:59:00 PM  Radiology CT ABDOMEN PELVIS W CONTRAST Result Date: 11/18/2023 CLINICAL DATA:  Acute abdominal pain EXAM: CT ABDOMEN AND PELVIS WITH CONTRAST TECHNIQUE: Multidetector CT imaging of the abdomen and pelvis was performed using the standard protocol following bolus administration of intravenous contrast. RADIATION DOSE REDUCTION: This exam was performed according to the departmental dose-optimization program which includes automated exposure control, adjustment of the mA and/or kV according to patient size and/or use of iterative reconstruction technique. CONTRAST:  OMNIPAQUE IOHEXOL 300 MG/ML  SOLN COMPARISON:  06/12/2021 FINDINGS: Lower chest: No acute abnormality. Hepatobiliary: Liver is within normal limits. Gallbladder is well distended with a  single dependent density without significant calcification. This may represent a small gallstone or gallbladder polyp. Pancreas: Unremarkable. No pancreatic ductal dilatation or surrounding inflammatory changes. Spleen: Normal in size without focal abnormality. Adrenals/Urinary Tract: Adrenal glands are within normal limits. Kidneys demonstrate a normal enhancement pattern bilaterally. Simple renal cysts are seen bilaterally and stable from the prior exam. No further follow-up is recommended. No obstructive changes are seen. Bladder is well distended. Stomach/Bowel: The appendix is within normal limits. No obstructive or inflammatory changes of the colon are seen. Stomach is well distended with ingested food stuffs  and fluid. Some findings of reflux are noted in the distal esophagus. Mild esophageal thickening is noted which may represent some reflux esophagitis. Small bowel is within normal limits. Vascular/Lymphatic: Aortic atherosclerosis. No enlarged abdominal or pelvic lymph nodes. Reproductive: Prostate is unremarkable. Other: Bilateral fat containing inguinal hernias are again noted. No bowel is noted within. Prior ventral hernia repair is noted. Musculoskeletal: No acute or significant osseous findings. IMPRESSION: Dependent density within the gallbladder likely representing a polyp or poorly calcified gallstone. Changes of gastroesophageal reflux and possible reflux esophagitis. No acute abnormality noted. Electronically Signed   By: Alcide Clever M.D.   On: 11/18/2023 20:14   DG Chest Port 1 View Result Date: 11/18/2023 CLINICAL DATA:  GI bleeding and vomiting. EXAM: PORTABLE CHEST 1 VIEW COMPARISON:  Chest x-ray 05/24/2018 FINDINGS: There small bilateral pleural effusions. There is minimal bibasilar atelectasis. The heart is enlarged. There is no pneumothorax or acute fracture. IMPRESSION: 1. Small bilateral pleural effusions. 2. Cardiomegaly. Electronically Signed   By: Darliss Cheney M.D.   On:  11/18/2023 19:39    Procedures Procedures    Medications Ordered in ED Medications  promethazine (PHENERGAN) 12.5 mg in sodium chloride 0.9 % 50 mL IVPB (has no administration in time range)  pantoprazole (PROTONIX) injection 40 mg (40 mg Intravenous Given 11/18/23 1850)  ondansetron (ZOFRAN) injection 4 mg (4 mg Intravenous Given 11/18/23 1850)  morphine (PF) 4 MG/ML injection 4 mg (4 mg Intravenous Given 11/18/23 1850)  sodium chloride 0.9 % bolus 500 mL (0 mLs Intravenous Stopped 11/18/23 2210)  iohexol (OMNIPAQUE) 300 MG/ML solution 100 mL (100 mLs Intravenous Contrast Given 11/18/23 1939)  alum & mag hydroxide-simeth (MAALOX/MYLANTA) 200-200-20 MG/5ML suspension 30 mL (30 mLs Oral Given 11/18/23 2034)    ED Course/ Medical Decision Making/ A&P                                 Medical Decision Making Amount and/or Complexity of Data Reviewed Labs: ordered. Radiology: ordered.  Risk OTC drugs. Prescription drug management. Decision regarding hospitalization.   This patient presents to the ED for concern of abd pain and vomiting, this involves an extensive number of treatment options, and is a complaint that carries with it a high risk of complications and morbidity.  The differential diagnosis includes gi bleed, gastritis, sbo   Co morbidities that complicate the patient evaluation  htn, dm, retinal coloboma (blind in left eye), GERD, OSA, anxiety, depression, and hoh.   Additional history obtained:  Additional history obtained from epic chart review External records from outside source obtained and reviewed including EMS report   Lab Tests:  I Ordered, and personally interpreted labs.  The pertinent results include:  cbc with wbc sl elevated at 12.6, inr 1.0, cmp nl other than bs elevated at 193   Imaging Studies ordered:  I ordered imaging studies including cxr and ct abd/pelvis  I independently visualized and interpreted imaging which showed  CXR:  Small  bilateral pleural effusions.  2. Cardiomegaly.  CT abd/pelvis: Dependent density within the gallbladder likely representing a polyp  or poorly calcified gallstone.    Changes of gastroesophageal reflux and possible reflux esophagitis.    No acute abnormality noted.   I agree with the radiologist interpretation   Cardiac Monitoring:  The patient was maintained on a cardiac monitor.  I personally viewed and interpreted the cardiac monitored which showed an underlying rhythm of: nsr  Medicines ordered and prescription drug management:  I ordered medication including ivfs, protonix, nausea, morphine  for sx  Reevaluation of the patient after these medicines showed that the patient improved I have reviewed the patients home medicines and have made adjustments as needed   Test Considered:  ct   Critical Interventions:  Ivfs/meds   Consultations Obtained:  I requested consultation with the hospitalist (Dr. Lorri Frederick),  and discussed lab and imaging findings as well as pertinent plan -he will admit   Problem List / ED Course:  Abd pain with n/v and possible UGI bleed:  pt has not had any emesis here that looked like GIB.  However, he is still having pain, hiccups, and nausea.  He tried to drink water, but threw up the water.  Additional meds ordered.  Pt d/w hospitalist for admission.  He will need gi consult in am.   Reevaluation:  After the interventions noted above, I reevaluated the patient and found that they have :improved   Social Determinants of Health:  Lives at home   Dispostion:  After consideration of the diagnostic results and the patients response to treatment, I feel that the patent would benefit from admission.          Final Clinical Impression(s) / ED Diagnoses Final diagnoses:  Epigastric pain  Esophagitis  Uncontrollable vomiting    Rx / DC Orders ED Discharge Orders     None         Jacalyn Lefevre, MD 11/18/23 2256

## 2023-11-18 NOTE — ED Notes (Signed)
ED TO INPATIENT HANDOFF REPORT  ED Nurse Name and Phone #: Ephriam Knuckles Paramedic  S Name/Age/Gender [name] [name] 73 y.o. male Room/Bed: APA14/APA14  Code Status   Code Status: Prior  Home/SNF/Other Home Patient oriented to: self, place, time, and situation Is this baseline? Yes   Triage Complete: Triage complete  Chief Complaint Intractable nausea and vomiting [R11.2]  Triage Note Pt states he began vomiting this am with pain in epigastric area. Describes emesis as black. Per EMS, He vomited 3 time, 2 before they arrived then once since. 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   Stroke [facility])    Past Surgical History:  Procedure Laterality Date   CATARACT EXTRACTION W/PHACO Right 08/14/2022   Procedure: CATARACT EXTRACTION PHACO AND INTRAOCULAR LENS PLACEMENT (IOC);  Surgeon: Fabio Pierce, MD;  Location: [facility];  Service: Ophthalmology;  Laterality: Right;  CDE 12.51   RECTAL SURGERY     As an infant   TONSILLECTOMY     UMBILICAL HERNIA REPAIR       A IV Location/Drains/Wounds Patient Lines/Drains/Airways Status      Active Line/Drains/Airways     Name Placement date Placement time Site Days   Peripheral IV 11/18/23 18 G Anterior;Distal;Left;Upper Arm 11/18/23  1834  Arm  less than 1            Intake/Output Last 24 hours  Intake/Output Summary (Last 24 hours) at 11/18/2023 2317 Last data filed at 11/18/2023 2210 Gross per 24 hour  Intake 500 ml  Output --  Net 500 ml    Labs/Imaging Results for orders placed or performed during the hospital encounter of 11/18/23 (from the past 48 hours)  Type and screen [facility]     Status: None   Collection Time: 11/18/23  6:55 PM  Result Value Ref Range   ABO/RH(D) O POS    Antibody Screen NEG    Sample Expiration      11/21/2023,2359 Performed at [facility], [address], [place], [place] [address]   Comprehensive metabolic panel     Status: Abnormal   Collection Time: 11/18/23  6:58 PM  Result Value Ref Range   Sodium 137 135 - 145 mmol/L   Potassium 3.5 3.5 - 5.1 mmol/L   Chloride 102 98 - 111 mmol/L   CO2 24 22 - 32 mmol/L   Glucose, Bld 193 (H) 70 - 99 mg/dL    Comment: Glucose reference range applies only to samples taken after fasting for at least 8 hours.   BUN 15 8 - 23  mg/dL   Creatinine, Ser 1.61 0.61 - 1.24 mg/dL   Calcium 8.8 (L) 8.9 - 10.3 mg/dL   Total Protein 7.3 6.5 - 8.1 g/dL   Albumin 4.0 3.5 - 5.0 g/dL   AST 20 15 - 41 U/L   ALT 25 0 - 44 U/L   Alkaline Phosphatase 62 38 - 126 U/L   Total Bilirubin 3.1 (H) 0.0 - 1.2 mg/dL   GFR, Estimated >09 >60 mL/min    Comment: (NOTE) Calculated using the CKD-EPI Creatinine Equation (2021)    Anion gap 11 5 - 15    Comment: Performed at [facility], [address], [place], [place] [address]  CBC with Differential     Status: Abnormal   Collection Time: 11/18/23  6:58 PM  Result Value Ref Range   WBC 12.6 (H) 4.0 - 10.5 K/uL   RBC 5.79 4.22 - 5.81 MIL/uL   Hemoglobin 16.5 13.0 - 17.0 g/dL   HCT 81.1 91.4 - 78.2 %   MCV 84.6 80.0 - 100.0 fL   MCH 28.5 26.0  - 34.0 pg   MCHC 33.7 30.0 - 36.0 g/dL   RDW 95.6 21.3 - 08.6 %   Platelets 211 150 - 400 K/uL   nRBC 0.0 0.0 - 0.2 %   Neutrophils Relative % 79 %   Neutro Abs 10.1 (H) 1.7 - 7.7 K/uL   Lymphocytes Relative 12 %   Lymphs Abs 1.5 0.7 - 4.0 K/uL   Monocytes Relative 6 %   Monocytes Absolute 0.8 0.1 - 1.0 K/uL   Eosinophils Relative 1 %   Eosinophils Absolute 0.1 0.0 - 0.5 K/uL   Basophils Relative 1 %   Basophils Absolute 0.1 0.0 - 0.1 K/uL   Immature Granulocytes 1 %   Abs Immature Granulocytes 0.06 0.00 - 0.07 K/uL    Comment: Performed at [facility], [address] [place], [place] [address]  Protime-INR     Status: None   Collection Time: 11/18/23  6:58 PM  Result Value Ref Range   Prothrombin Time 13.6 11.4 - 15.2 seconds   INR 1.0 0.8 - 1.2    Comment: (NOTE) INR goal varies based on device and disease states. Performed at [facility], [address], [place], [place] [address]   POC occult blood, ED Provider will collect     Status: None   Collection Time: 11/18/23  8:15 PM  Result Value Ref Range   Negative     CT ABDOMEN PELVIS W CONTRAST Result Date: 11/18/2023 CLINICAL DATA:  Acute abdominal pain EXAM: CT ABDOMEN AND PELVIS WITH CONTRAST TECHNIQUE: Multidetector CT imaging of the abdomen and pelvis was performed using the standard protocol following bolus administration of intravenous contrast. RADIATION DOSE REDUCTION: This exam was performed according to the departmental dose-optimization program which includes automated exposure control, adjustment of the mA and/or kV according to patient size and/or use of iterative reconstruction technique. CONTRAST:  OMNIPAQUE IOHEXOL 300 MG/ML  SOLN COMPARISON:  06/12/2021 FINDINGS: Lower chest: No acute abnormality. Hepatobiliary: Liver is within normal limits. Gallbladder is well distended with a single dependent density without significant calcification. This may represent a small gallstone or gallbladder polyp. Pancreas:  Unremarkable. No pancreatic ductal dilatation or surrounding inflammatory changes. Spleen: Normal in size without focal abnormality. Adrenals/Urinary Tract: Adrenal glands are within normal limits. Kidneys demonstrate a normal enhancement pattern bilaterally. Simple renal cysts are seen bilaterally and stable from the prior exam. No further follow-up is recommended. No obstructive changes are seen.  Bladder is well distended. Stomach/Bowel: The appendix is within normal limits. No obstructive or inflammatory changes of the colon are seen. Stomach is well distended with ingested food stuffs and fluid. Some findings of reflux are noted in the distal esophagus. Mild esophageal thickening is noted which may represent some reflux esophagitis. Small bowel is within normal limits. Vascular/Lymphatic: Aortic atherosclerosis. No enlarged abdominal or pelvic lymph nodes. Reproductive: Prostate is unremarkable. Other: Bilateral fat containing inguinal hernias are again noted. No bowel is noted within. Prior ventral hernia repair is noted. Musculoskeletal: No acute or significant osseous findings. IMPRESSION: Dependent density within the gallbladder likely representing a polyp or poorly calcified gallstone. Changes of gastroesophageal reflux and possible reflux esophagitis. No acute abnormality noted. Electronically Signed   By: Alcide Clever M.D.   On: 11/18/2023 20:14   DG Chest Port 1 View Result Date: 11/18/2023 CLINICAL DATA:  GI bleeding and vomiting. EXAM: PORTABLE CHEST 1 VIEW COMPARISON:  Chest x-ray 05/24/2018 FINDINGS: There small bilateral pleural effusions. There is minimal bibasilar atelectasis. The heart is enlarged. There is no pneumothorax or acute fracture. IMPRESSION: 1. Small bilateral pleural effusions. 2. Cardiomegaly. Electronically Signed   By: Darliss Cheney M.D.   On: 11/18/2023 19:39    Pending Labs Unresulted Labs (From admission, onward)    None       Vitals/Pain Today's Vitals    11/18/23 2045 11/18/23 2115 11/18/23 2200 11/18/23 2230  BP: (!) 148/74 131/80 (!) 158/80 (!) 163/88  Pulse: 73 72 77 75  Resp: 15 12 18 11   Temp:      TempSrc:      SpO2: 98% 98% 98% 97%  Weight:      Height:      PainSc:        Isolation Precautions No active isolations  Medications Medications  morphine (PF) 4 MG/ML injection 4 mg (has no administration in time range)  pantoprazole (PROTONIX) injection 40 mg (40 mg Intravenous Given 11/18/23 1850)  ondansetron (ZOFRAN) injection 4 mg (4 mg Intravenous Given 11/18/23 1850)  morphine (PF) 4 MG/ML injection 4 mg (4 mg Intravenous Given 11/18/23 1850)  sodium chloride 0.9 % bolus 500 mL (0 mLs Intravenous Stopped 11/18/23 2210)  iohexol (OMNIPAQUE) 300 MG/ML solution 100 mL (100 mLs Intravenous Contrast Given 11/18/23 1939)  alum & mag hydroxide-simeth (MAALOX/MYLANTA) 200-200-20 MG/5ML suspension 30 mL (30 mLs Oral Given 11/18/23 2034)  promethazine (PHENERGAN) 12.5 mg in sodium chloride 0.9 % 50 mL IVPB (12.5 mg Intravenous New Bag/Given 11/18/23 2256)    Mobility walks     Focused Assessments    R Recommendations: See Admitting Provider Note  Report given to:   Additional Notes:

## 2023-11-18 NOTE — ED Notes (Signed)
ED Provider at bedside.

## 2023-11-18 NOTE — ED Notes (Signed)
Patient transported to CT

## 2023-11-18 NOTE — H&P (Incomplete)
History and Physical    Patient: [name] [name]:[id number] DOB: December 11, 1950 DOA: 11/18/2023 DOS: the patient was seen and examined on 11/19/2023 PCP: Rebekah Chesterfield, NP  Patient coming from: Home  Chief Complaint:  Chief Complaint  Patient presents with   Emesis   HPI: [name] [name] is a 73 y.o. male with medical history significant of T2DM, hypertension, anxiety, depression, GERD, history of TIA who presents to the emergency department due to vomiting which started this morning.  Vomitus was referred to as being black and it was associated with diffuse abdominal pain.  ED Course:  In the emergency department, BP was 170/85, but other vital signs are within normal range.  Workup in the ED showed normal CBC except for WBC.  BMP was normal except for blood glucose of 193 CT abdomen and pelvis with contrast shows changes of gastroesophageal reflux and possible reflux esophagitis Chest x-ray showed small bilateral pleural effusions.  Cardiomegaly Pain medication, antiemetic with Phenergan and Zofran were given.  IV hydration was provided.  Hospitalist was asked to admit patient for further evaluation and management.  Review of Systems: Review of systems as noted in the HPI. All other systems reviewed and are negative.   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   Stroke [facility])    Past Surgical History:  Procedure Laterality Date   CATARACT EXTRACTION W/PHACO Right 08/14/2022   Procedure: CATARACT EXTRACTION PHACO AND INTRAOCULAR LENS PLACEMENT (IOC);  Surgeon: Fabio Pierce, MD;  Location: [facility];  Service: Ophthalmology;  Laterality: Right;  CDE 12.51   RECTAL SURGERY      As an infant   TONSILLECTOMY     UMBILICAL HERNIA REPAIR      Social History:  reports that he has never smoked. He has never used smokeless tobacco. He reports that he does not currently use alcohol. He reports that he does not use drugs.   No Known Allergies  Family History  Problem Relation Age of Onset   Sudden death Sister    Dementia Other    Alzheimer's disease Other    Diabetes Other    Coronary artery disease Other    Heart attack Other    Cancer Other    Lung cancer Other    Cancer Mother        lung   Diabetes Mother    Cancer Father    Diabetes Father    Colon cancer Neg Hx      Prior to Admission medications   Medication Sig Start Date End Date Taking? Authorizing Provider  amLODipine (NORVASC) 10 MG tablet 1 tablet Orally daily for 90 days    [provider]  aspirin 81 MG chewable tablet Chew 162 mg by mouth daily.    [provider]  atorvastatin (LIPITOR) 20 MG tablet Take 1 tablet (20 mg total) by mouth daily. 06/14/21   Vassie Loll, MD  clopidogrel (PLAVIX) 75 MG tablet Take 1 tablet (75 mg total) by mouth daily. 11/14/22   Everlena Cooper, Adam R, DO  furosemide (LASIX) 20 MG tablet TAKE ONE TABLET BY MOUTH IN THE  MORNING FOR BLOOD PRESSURE AND FLUID (MORNING) 01/18/17   Dettinger, Elige Radon, MD  HYDROcodone-acetaminophen (NORCO) 7.5-325 MG tablet 1 tablet as needed Orally bid prn for 30 09/06/21   [provider]  HYDROcodone-acetaminophen (NORCO/VICODIN) 5-325 MG tablet Take 1 tablet by mouth daily as needed for severe pain. Reported on 05/08/2016 12/22/16   Dettinger, Elige Radon, MD  JARDIANCE 25 MG TABS tablet Take 25 mg by mouth daily. 06/02/21   [provider]  metFORMIN (GLUCOPHAGE) 1000 MG tablet TAKE ONE TABLET BY MOUTH TWICE DAILY WITH A MEAL. (MORNING ,EVENING) Patient taking differently: Take 1,000 mg by mouth 2 (two) times daily with a meal. 03/14/17   Dettinger, Elige Radon, MD  nitroGLYCERIN (NITROSTAT) 0.4 MG SL tablet  Place 0.4 mg under the tongue every 5 (five) minutes as needed for chest pain.    [provider]  potassium chloride (K-DUR,KLOR-CON) 10 MEQ tablet TAKE ONE TABLET BY MOUTH TWICE DAILY (MORNING ,EVENING) Patient taking differently: Take 10 mEq by mouth 2 (two) times daily. 01/18/17   Dettinger, Elige Radon, MD  TRESIBA FLEXTOUCH 100 UNIT/ML FlexTouch Pen SMARTSIG:20 Unit(s) SUB-Q Daily 09/01/21   [provider]    Physical Exam: BP (!) 160/82 (BP Location: Right Arm)   Pulse 74   Temp 98.3 F (36.8 C) (Oral)   Resp 20   Ht 5\' 1"  (1.549 m)   Wt 83.6 kg   SpO2 99%   BMI 34.82 kg/m   General: 73 y.o. year-old male well developed well nourished in no acute distress.  Alert and oriented x3. HEENT: NCAT, EOMI, dry mucous membrane Neck: Supple, trachea medial Cardiovascular: Regular rate and rhythm with no rubs or gallops.  No thyromegaly or JVD noted.  No lower extremity edema. 2/4 pulses in all 4 extremities. Respiratory: Clear to auscultation with no wheezes or rales. Good inspiratory effort. Abdomen: Soft, tender to palpation without guarding. Nondistended with normal bowel sounds x4 quadrants. Muskuloskeletal: No cyanosis, clubbing or edema noted bilaterally Neuro: CN II-XII intact, strength 5/5 x 4, sensation, reflexes intact Skin: No ulcerative lesions noted or rashes Psychiatry: Judgement and insight appear normal. Mood is appropriate for condition and setting          Labs on Admission:  Basic Metabolic Panel: Recent Labs  Lab 11/18/23 1858  NA 137  K 3.5  CL 102  CO2 24  GLUCOSE 193*  BUN 15  CREATININE 1.00  CALCIUM 8.8*   Liver Function Tests: Recent Labs  Lab 11/18/23 1858  AST 20  ALT 25  ALKPHOS 62  BILITOT 3.1*  PROT 7.3  ALBUMIN 4.0   No results for input(s): "LIPASE", "AMYLASE" in the last 168 hours. No results for input(s): "AMMONIA" in the last 168 hours. CBC: Recent Labs  Lab 11/18/23 1858  WBC 12.6*  NEUTROABS 10.1*  HGB  16.5  HCT 49.0  MCV 84.6  PLT 211   Cardiac Enzymes: No results for input(s): "CKTOTAL", "CKMB", "CKMBINDEX", "TROPONINI" in the last 168 hours.  BNP (last 3 results) No results for input(s): "BNP" in the last 8760 hours.  ProBNP (last 3 results) No results for input(s): "PROBNP" in the last 8760 hours.  CBG: No results for input(s): "GLUCAP" in the last 168 hours.  Radiological Exams on Admission: CT ABDOMEN PELVIS W CONTRAST Result Date: 11/18/2023 CLINICAL DATA:  Acute abdominal pain EXAM: CT ABDOMEN AND PELVIS WITH CONTRAST TECHNIQUE: Multidetector CT imaging of the abdomen and pelvis was performed using the standard protocol following bolus administration of  intravenous contrast. RADIATION DOSE REDUCTION: This exam was performed according to the departmental dose-optimization program which includes automated exposure control, adjustment of the mA and/or kV according to patient size and/or use of iterative reconstruction technique. CONTRAST:  OMNIPAQUE IOHEXOL 300 MG/ML  SOLN COMPARISON:  06/12/2021 FINDINGS: Lower chest: No acute abnormality. Hepatobiliary: Liver is within normal limits. Gallbladder is well distended with a single dependent density without significant calcification. This may represent a small gallstone or gallbladder polyp. Pancreas: Unremarkable. No pancreatic ductal dilatation or surrounding inflammatory changes. Spleen: Normal in size without focal abnormality. Adrenals/Urinary Tract: Adrenal glands are within normal limits. Kidneys demonstrate a normal enhancement pattern bilaterally. Simple renal cysts are seen bilaterally and stable from the prior exam. No further follow-up is recommended. No obstructive changes are seen. Bladder is well distended. Stomach/Bowel: The appendix is within normal limits. No obstructive or inflammatory changes of the colon are seen. Stomach is well distended with ingested food stuffs and fluid. Some findings of reflux are noted in the  distal esophagus. Mild esophageal thickening is noted which may represent some reflux esophagitis. Small bowel is within normal limits. Vascular/Lymphatic: Aortic atherosclerosis. No enlarged abdominal or pelvic lymph nodes. Reproductive: Prostate is unremarkable. Other: Bilateral fat containing inguinal hernias are again noted. No bowel is noted within. Prior ventral hernia repair is noted. Musculoskeletal: No acute or significant osseous findings. IMPRESSION: Dependent density within the gallbladder likely representing a polyp or poorly calcified gallstone. Changes of gastroesophageal reflux and possible reflux esophagitis. No acute abnormality noted. Electronically Signed   By: Alcide Clever M.D.   On: 11/18/2023 20:14   DG Chest Port 1 View Result Date: 11/18/2023 CLINICAL DATA:  GI bleeding and vomiting. EXAM: PORTABLE CHEST 1 VIEW COMPARISON:  Chest x-ray 05/24/2018 FINDINGS: There small bilateral pleural effusions. There is minimal bibasilar atelectasis. The heart is enlarged. There is no pneumothorax or acute fracture. IMPRESSION: 1. Small bilateral pleural effusions. 2. Cardiomegaly. Electronically Signed   By: Darliss Cheney M.D.   On: 11/18/2023 19:39    EKG: I independently viewed the EKG done and my findings are as followed: Normal sinus rhythm at a rate of 94 bpm  Assessment/Plan Present on Admission:  Intractable nausea and vomiting  Essential hypertension  Principal Problem:   Intractable nausea and vomiting Active Problems:   Essential hypertension   Abdominal pain   Esophagitis   Type 2 diabetes mellitus with hyperglycemia (HCC)   Mixed hyperlipidemia  Intractable nausea and vomiting Patient has not had any vomiting since arrival to the ED Continue IV Zofran p.r.n. Continue IV hydration Patient will be started on clear liquid diet in the morning with plan to advance as tolerated  Abdominal pain Continue IV hydration Continue IV morphine 2 mg q.3h p.r.n. for moderate to  severe pain Continue IV Zofran p.r.n. Continue clear liquid diet with plan to advanced diet as tolerated  Esophagitis Continue Protonix  Type 2 diabetes mellitus with hyperglycemia Continue ISS and hypoglycemia protocol  Bilateral pleural effusion IV Lasix 20 mg x 1 will be given  Essential hypertension Continue amlodipine  Mixed hyperlipidemia Continue Lipitor   DVT prophylaxis: SCDs   Code Status: Full code  Family Communication: None at bedside  Consults: None  Severity of Illness: The appropriate patient status for this patient is INPATIENT. Inpatient status is judged to be reasonable and necessary in order to provide the required intensity of service to ensure the patient's safety. The patient's presenting symptoms, physical exam findings, and initial radiographic and laboratory  data in the context of their chronic comorbidities is felt to place them at high risk for further clinical deterioration. Furthermore, it is not anticipated that the patient will be medically stable for discharge from the hospital within 2 midnights of admission.   * I certify that at the point of admission it is my clinical judgment that the patient will require inpatient hospital care spanning beyond 2 midnights from the point of admission due to high intensity of service, high risk for further deterioration and high frequency of surveillance required.*  Author: Frankey Shown, DO 11/19/2023 1:24 AM  For on call review [email or web address].

## 2023-11-18 NOTE — ED Triage Notes (Signed)
Pt states he began vomiting this am with pain in epigastric area. Describes emesis as black. Per EMS, He vomited 3 time, 2 before they arrived then once since. 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

## 2023-11-18 NOTE — Plan of Care (Signed)
  Problem: Education: Goal: Knowledge of General Education information will improve Description: Including pain rating scale, medication(s)/side effects and non-pharmacologic comfort measures Outcome: Progressing   Problem: Coping: Goal: Level of anxiety will decrease Outcome: Progressing   Problem: Safety: Goal: Ability to remain free from injury will improve Outcome: Progressing   Problem: Nutrition: Goal: Adequate nutrition will be maintained Outcome: Not Progressing   Problem: Pain Managment: Goal: General experience of comfort will improve and/or be controlled Outcome: Not Progressing

## 2023-11-18 NOTE — ED Notes (Signed)
ED provider advised that he vomited the water up.

## 2023-11-19 DIAGNOSIS — Z833 Family history of diabetes mellitus: Secondary | ICD-10-CM | POA: Diagnosis not present

## 2023-11-19 DIAGNOSIS — Z794 Long term (current) use of insulin: Secondary | ICD-10-CM | POA: Diagnosis not present

## 2023-11-19 DIAGNOSIS — G4733 Obstructive sleep apnea (adult) (pediatric): Secondary | ICD-10-CM | POA: Diagnosis present

## 2023-11-19 DIAGNOSIS — E1165 Type 2 diabetes mellitus with hyperglycemia: Secondary | ICD-10-CM | POA: Diagnosis present

## 2023-11-19 DIAGNOSIS — F419 Anxiety disorder, unspecified: Secondary | ICD-10-CM | POA: Diagnosis present

## 2023-11-19 DIAGNOSIS — R1110 Vomiting, unspecified: Secondary | ICD-10-CM | POA: Diagnosis present

## 2023-11-19 DIAGNOSIS — K2100 Gastro-esophageal reflux disease with esophagitis, without bleeding: Secondary | ICD-10-CM | POA: Diagnosis present

## 2023-11-19 DIAGNOSIS — I119 Hypertensive heart disease without heart failure: Secondary | ICD-10-CM | POA: Diagnosis present

## 2023-11-19 DIAGNOSIS — R0609 Other forms of dyspnea: Secondary | ICD-10-CM | POA: Diagnosis not present

## 2023-11-19 DIAGNOSIS — Z8673 Personal history of transient ischemic attack (TIA), and cerebral infarction without residual deficits: Secondary | ICD-10-CM | POA: Diagnosis not present

## 2023-11-19 DIAGNOSIS — Z7982 Long term (current) use of aspirin: Secondary | ICD-10-CM | POA: Diagnosis not present

## 2023-11-19 DIAGNOSIS — R112 Nausea with vomiting, unspecified: Secondary | ICD-10-CM | POA: Diagnosis present

## 2023-11-19 DIAGNOSIS — E782 Mixed hyperlipidemia: Secondary | ICD-10-CM | POA: Diagnosis present

## 2023-11-19 DIAGNOSIS — K209 Esophagitis, unspecified without bleeding: Secondary | ICD-10-CM | POA: Diagnosis present

## 2023-11-19 DIAGNOSIS — Z820 Family history of epilepsy and other diseases of the nervous system: Secondary | ICD-10-CM | POA: Diagnosis not present

## 2023-11-19 DIAGNOSIS — J90 Pleural effusion, not elsewhere classified: Secondary | ICD-10-CM | POA: Diagnosis present

## 2023-11-19 DIAGNOSIS — R066 Hiccough: Secondary | ICD-10-CM | POA: Diagnosis present

## 2023-11-19 DIAGNOSIS — H5462 Unqualified visual loss, left eye, normal vision right eye: Secondary | ICD-10-CM | POA: Diagnosis present

## 2023-11-19 DIAGNOSIS — Z8249 Family history of ischemic heart disease and other diseases of the circulatory system: Secondary | ICD-10-CM | POA: Diagnosis not present

## 2023-11-19 DIAGNOSIS — I2510 Atherosclerotic heart disease of native coronary artery without angina pectoris: Secondary | ICD-10-CM | POA: Diagnosis present

## 2023-11-19 DIAGNOSIS — Z7984 Long term (current) use of oral hypoglycemic drugs: Secondary | ICD-10-CM | POA: Diagnosis not present

## 2023-11-19 DIAGNOSIS — R109 Unspecified abdominal pain: Secondary | ICD-10-CM | POA: Diagnosis present

## 2023-11-19 DIAGNOSIS — F32A Depression, unspecified: Secondary | ICD-10-CM | POA: Diagnosis present

## 2023-11-19 DIAGNOSIS — Z7985 Long-term (current) use of injectable non-insulin antidiabetic drugs: Secondary | ICD-10-CM | POA: Diagnosis not present

## 2023-11-19 DIAGNOSIS — Z801 Family history of malignant neoplasm of trachea, bronchus and lung: Secondary | ICD-10-CM | POA: Diagnosis not present

## 2023-11-19 DIAGNOSIS — Z7902 Long term (current) use of antithrombotics/antiplatelets: Secondary | ICD-10-CM | POA: Diagnosis not present

## 2023-11-19 DIAGNOSIS — Z79899 Other long term (current) drug therapy: Secondary | ICD-10-CM | POA: Diagnosis not present

## 2023-11-19 DIAGNOSIS — R531 Weakness: Secondary | ICD-10-CM | POA: Diagnosis present

## 2023-11-19 LAB — BASIC METABOLIC PANEL
Anion gap: 6 (ref 5–15)
BUN: 18 mg/dL (ref 8–23)
CO2: 26 mmol/L (ref 22–32)
Calcium: 7.9 mg/dL — ABNORMAL LOW (ref 8.9–10.3)
Chloride: 102 mmol/L (ref 98–111)
Creatinine, Ser: 1.06 mg/dL (ref 0.61–1.24)
GFR, Estimated: >60 mL/min (ref >60)
Glucose, Bld: 159 mg/dL — ABNORMAL HIGH (ref 70–99)
Potassium: 3.6 mmol/L (ref 3.5–5.1)
Sodium: 134 mmol/L — ABNORMAL LOW (ref 135–145)

## 2023-11-19 LAB — CBC
HCT: 44.8 % (ref 39.0–52.0)
Hemoglobin: 15 g/dL (ref 13.0–17.0)
MCH: 28.7 pg (ref 26.0–34.0)
MCHC: 33.5 g/dL (ref 30.0–36.0)
MCV: 85.8 fL (ref 80.0–100.0)
Platelets: 217 10*3/uL (ref 150–400)
RBC: 5.22 MIL/uL (ref 4.22–5.81)
RDW: 14.1 % (ref 11.5–15.5)
WBC: 13.2 10*3/uL — ABNORMAL HIGH (ref 4.0–10.5)
nRBC: 0 % (ref 0.0–0.2)

## 2023-11-19 LAB — COMPREHENSIVE METABOLIC PANEL
ALT: 24 U/L (ref 0–44)
AST: 16 U/L (ref 15–41)
Albumin: 3.5 g/dL (ref 3.5–5.0)
Alkaline Phosphatase: 57 U/L (ref 38–126)
Anion gap: 9 (ref 5–15)
BUN: 16 mg/dL (ref 8–23)
CO2: 26 mmol/L (ref 22–32)
Calcium: 8.2 mg/dL — ABNORMAL LOW (ref 8.9–10.3)
Chloride: 103 mmol/L (ref 98–111)
Creatinine, Ser: 1.05 mg/dL (ref 0.61–1.24)
GFR, Estimated: >60 mL/min (ref >60)
Glucose, Bld: 177 mg/dL — ABNORMAL HIGH (ref 70–99)
Potassium: 4.1 mmol/L (ref 3.5–5.1)
Sodium: 138 mmol/L (ref 135–145)
Total Bilirubin: 2.9 mg/dL — ABNORMAL HIGH (ref 0.0–1.2)
Total Protein: 6.5 g/dL (ref 6.5–8.1)

## 2023-11-19 LAB — GLUCOSE, CAPILLARY
Glucose-Capillary: 124 mg/dL — ABNORMAL HIGH (ref 70–99)
Glucose-Capillary: 129 mg/dL — ABNORMAL HIGH (ref 70–99)
Glucose-Capillary: 108 mg/dL — ABNORMAL HIGH (ref 70–99)
Glucose-Capillary: 123 mg/dL — ABNORMAL HIGH (ref 70–99)

## 2023-11-19 LAB — HEMOGLOBIN A1C
Hgb A1c MFr Bld: 7.2 % — ABNORMAL HIGH (ref 4.8–5.6)
Mean Plasma Glucose: 159.94 mg/dL

## 2023-11-19 LAB — HIV ANTIBODY (ROUTINE TESTING W REFLEX): HIV Screen 4th Generation wRfx: NONREACTIVE

## 2023-11-19 LAB — PHOSPHORUS: Phosphorus: 2.5 mg/dL (ref 2.5–4.6)

## 2023-11-19 LAB — MAGNESIUM: Magnesium: 1.9 mg/dL (ref 1.7–2.4)

## 2023-11-19 MED ORDER — MORPHINE SULFATE (PF) 2 MG/ML IV SOLN
2.0000 mg | INTRAVENOUS | Status: DC | PRN
  Administered 2023-11-19 (×2): 2 mg via INTRAVENOUS
  Filled 2023-11-19 (×2): qty 1

## 2023-11-19 MED ORDER — SODIUM CHLORIDE 0.9 % IV SOLN: INTRAVENOUS | Status: AC

## 2023-11-19 MED ORDER — AMLODIPINE BESYLATE 5 MG PO TABS
10.0000 mg | ORAL_TABLET | Freq: Every day | ORAL | Status: DC
  Administered 2023-11-19 – 2023-11-20 (×2): 10 mg via ORAL
  Filled 2023-11-19 (×2): qty 2

## 2023-11-19 MED ORDER — ASPIRIN 81 MG PO TBEC
81.0000 mg | DELAYED_RELEASE_TABLET | Freq: Every day | ORAL | Status: DC
  Administered 2023-11-20: 81 mg via ORAL
  Filled 2023-11-19: qty 1

## 2023-11-19 MED ORDER — ONDANSETRON HCL 4 MG PO TABS
4.0000 mg | ORAL_TABLET | Freq: Four times a day (QID) | ORAL | Status: DC | PRN
  Administered 2023-11-19: 4 mg via ORAL
  Filled 2023-11-19: qty 1

## 2023-11-19 MED ORDER — CHLORPROMAZINE HCL 25 MG PO TABS
25.0000 mg | ORAL_TABLET | Freq: Four times a day (QID) | ORAL | Status: DC | PRN
  Administered 2023-11-19 (×4): 25 mg via ORAL
  Filled 2023-11-19 (×4): qty 1

## 2023-11-19 MED ORDER — PANTOPRAZOLE SODIUM 40 MG PO TBEC
40.0000 mg | DELAYED_RELEASE_TABLET | Freq: Every day | ORAL | Status: DC
  Filled 2023-11-19: qty 1

## 2023-11-19 MED ORDER — LABETALOL HCL 5 MG/ML IV SOLN: 10.0000 mg | INTRAVENOUS | Status: DC | PRN

## 2023-11-19 MED ORDER — ACETAMINOPHEN 650 MG RE SUPP: 650.0000 mg | Freq: Four times a day (QID) | RECTAL | Status: DC | PRN

## 2023-11-19 MED ORDER — METOPROLOL TARTRATE 25 MG PO TABS
25.0000 mg | ORAL_TABLET | Freq: Two times a day (BID) | ORAL | Status: DC
  Administered 2023-11-19 – 2023-11-20 (×2): 25 mg via ORAL
  Filled 2023-11-19 (×2): qty 1

## 2023-11-19 MED ORDER — PROCHLORPERAZINE 25 MG RE SUPP
25.0000 mg | Freq: Once | RECTAL | Status: AC
  Administered 2023-11-19: 25 mg via RECTAL
  Filled 2023-11-19 (×3): qty 1

## 2023-11-19 MED ORDER — INSULIN ASPART 100 UNIT/ML IJ SOLN
0.0000 [IU] | Freq: Three times a day (TID) | INTRAMUSCULAR | Status: DC
  Administered 2023-11-19 (×2): 2 [IU] via SUBCUTANEOUS
  Administered 2023-11-20: 3 [IU] via SUBCUTANEOUS

## 2023-11-19 MED ORDER — FUROSEMIDE 10 MG/ML IJ SOLN
20.0000 mg | Freq: Once | INTRAMUSCULAR | Status: AC
  Administered 2023-11-19: 20 mg via INTRAVENOUS
  Filled 2023-11-19: qty 2

## 2023-11-19 MED ORDER — ACETAMINOPHEN 325 MG PO TABS
650.0000 mg | ORAL_TABLET | Freq: Four times a day (QID) | ORAL | Status: DC | PRN
Start: 2023-11-19 — End: 2023-11-20

## 2023-11-19 MED ORDER — ONDANSETRON HCL 4 MG/2ML IJ SOLN
4.0000 mg | Freq: Four times a day (QID) | INTRAMUSCULAR | Status: DC | PRN
  Administered 2023-11-19: 4 mg via INTRAVENOUS
  Filled 2023-11-19: qty 2

## 2023-11-19 MED ORDER — PANTOPRAZOLE SODIUM 40 MG PO TBEC
40.0000 mg | DELAYED_RELEASE_TABLET | Freq: Two times a day (BID) | ORAL | Status: DC
  Administered 2023-11-19 – 2023-11-20 (×3): 40 mg via ORAL
  Filled 2023-11-19 (×2): qty 1

## 2023-11-19 MED ORDER — ATORVASTATIN CALCIUM 40 MG PO TABS
20.0000 mg | ORAL_TABLET | Freq: Every day | ORAL | Status: DC
  Administered 2023-11-19 – 2023-11-20 (×2): 20 mg via ORAL
  Filled 2023-11-19 (×2): qty 1

## 2023-11-19 NOTE — TOC Initial Note (Signed)
Transition of Care [facility]) - Initial/Assessment Note    Patient Details  Name: [name] [name] MRN: [id number] Date of Birth: [birth year]  Transition of Care [facility]) CM/SW Contact:    Leitha Bleak, RN Phone Number: 11/19/2023, 11:25 AM  Clinical Narrative:    Patient admitted with with intractable nausea and vomiting. Patient lives at home with his wife. PT is recommending HHPT. Patient is agreeable. CMS choices reviewed. No preference, whoever his insurance would cover. Kandee Keen with [facility] accepted the referral for HHPT, MD aware to order.                Expected Discharge Plan: Home w Home Health Services Barriers to Discharge: Continued Medical Work up  Patient Goals and CMS Choice Patient states their goals for this hospitalization and ongoing recovery are:: to go home. CMS Medicare.gov Compare Post Acute Care list provided to:: Patient Choice offered to / list presented to : Patient     Expected Discharge Plan and Services     Post Acute Care Choice: Home Health Living arrangements for the past 2 months: Single Family Home                     HH Arranged: PT HH Agency: [facility] Date [facility] Agency Contacted: 11/19/23 Time HH Agency Contacted: [phone] Representative spoke with at [facility] Agency: Kandee Keen  Prior Living Arrangements/Services Living arrangements for the past 2 months: Single Family Home Lives with:: Spouse Patient language and need for interpreter reviewed:: Yes        Need for Family Participation in Patient Care: Yes (Comment) Care giver support system in place?: Yes (comment)   Criminal Activity/Legal Involvement Pertinent to Current Situation/Hospitalization: No - Comment as needed  Activities of Daily Living   ADL Screening (condition at time of admission) Independently performs ADLs?: Yes (appropriate for developmental age) Is the patient deaf or have difficulty hearing?: No Does the patient have difficulty seeing, even when wearing  glasses/contacts?: Yes Does the patient have difficulty concentrating, remembering, or making decisions?: No  Permission Sought/Granted                  Emotional Assessment     Affect (typically observed): Accepting Orientation: : Oriented to Self, Oriented to Place, Oriented to  Time, Oriented to Situation Alcohol / Substance Use: Not Applicable Psych Involvement: No (comment)  Admission diagnosis:  Uncontrollable vomiting [R11.10] Epigastric pain [R10.13] Intractable nausea and vomiting [R11.2] Esophagitis [K20.90] Patient Active Problem List   Diagnosis Date Noted   Abdominal pain 11/19/2023   Esophagitis 11/19/2023   Type 2 diabetes mellitus with hyperglycemia (HCC) 11/19/2023   Mixed hyperlipidemia 11/19/2023   Intractable nausea and vomiting 11/18/2023   Coronary artery disease 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 [place] [address] Phone: [phone] Fax: [phone]     Social Drivers of Health (SDOH) Social History: SDOH Screenings  Food Insecurity: No Food Insecurity (11/18/2023)  Housing: Low Risk  (11/18/2023)  Transportation Needs: No Transportation Needs (11/18/2023)  Utilities: Not At Risk (11/18/2023)  Financial Resource Strain: Medium Risk (11/03/2022)   Received from [facility]  Social Connections: Unknown (11/19/2023)  Tobacco Use: Low Risk   (11/18/2023)   SDOH Interventions:    Readmission Risk Interventions    11/19/2023   11:25 AM  Readmission Risk Prevention Plan  Post Dischage Appt Not Complete  Medication Screening Complete  Transportation Screening Complete

## 2023-11-19 NOTE — Plan of Care (Signed)
  Problem: Acute Rehab PT Goals(only PT should resolve) Goal: Pt Will Go Supine/Side To Sit Outcome: Progressing Flowsheets (Taken 11/19/2023 1346) Pt will go Supine/Side to Sit:  with contact guard assist  with HOB elevated Goal: Patient Will Transfer Sit To/From Stand Outcome: Progressing Flowsheets (Taken 11/19/2023 1346) Patient will transfer sit to/from stand: with contact guard assist Goal: Pt Will Transfer Bed To Chair/Chair To Bed Outcome: Progressing Flowsheets (Taken 11/19/2023 1346) Pt will Transfer Bed to Chair/Chair to Bed: with contact guard assist Goal: Pt Will Ambulate 11/19/2023 1350 by [name], Student-PT Flowsheets (Taken 11/19/2023 1350) Pt will Ambulate:  25 feet  with contact guard assist  with minimal assist  with rolling walker   Thea Silversmith Arena Lindahl SPT

## 2023-11-19 NOTE — Progress Notes (Signed)
PROGRESS NOTE  [name] [name], is a 73 y.o. male, DOB - January 11, 1951, [id number]  Admit date - 11/18/2023   Admitting Physician Frankey Shown, DO  Outpatient Primary MD for the patient is Rebekah Chesterfield, NP  LOS - 0  Chief Complaint  Patient presents with   Emesis      Brief Narrative:  73 y.o. male with medical history significant of T2DM, hypertension, anxiety, depression, GERD, history of TIA admitted on 11/18/2023 with intractable emesis  -Assessment and Plan: 1)Intractable Emesis--- CT abdomen and pelvis without acute findings--reflux esophagitis noted Failed trial of oral intake and at this morning -Doing better this evening with liquid diet -Continue as needed antiemetics -- Hiccups noted--- as needed Thorazine  2) reflux esophagitis--- CT abdomen findings noted -Protonix as prescribed -No bleeding concerns  3)Bilateral Pleural effusion/dyspnea----CT abdomen and chest x-ray with small bilateral pleural effusions and cardiomegaly --continue Lasix -Check echocardiogram -Consider diagnostic tap  4)HTN--continue PTA amlodipine -Add metoprolol  5)H/o CAD-continue aspirin, hold Plavix in case patient needs surgery -Continue Lipitor -Add metoprolol  6)DM2--A1c 7.2 reflecting uncontrolled DM with hyperglycemia PTA -- Hold Mounjaro, hold Jardiance, and hold [place] as well as metformin Use Novolog/Humalog Sliding scale insulin with Accu-Cheks/Fingersticks as ordered   Status is: Inpatient   Disposition: The patient is from: Home              Anticipated d/c is to: Home              Anticipated d/c date is: 1 day              Patient currently is not medically stable to d/c. Barriers: Not Clinically Stable-   Code Status :  -  Code Status: Full Code   Family Communication:    NA (patient is alert, awake and coherent)   DVT Prophylaxis  :   - SCDs  SCDs Start: 11/19/23 0057   Lab Results  Component Value Date   PLT 217 11/19/2023   Inpatient  Medications  Scheduled Meds:  amLODipine  10 mg Oral Daily   atorvastatin  20 mg Oral Daily   insulin aspart  0-15 Units Subcutaneous TID WC   pantoprazole  40 mg Oral BID   prochlorperazine  25 mg Rectal Once   Continuous Infusions:  sodium chloride 83 mL/hr at 11/19/23 0909   PRN Meds:.acetaminophen **OR** acetaminophen, chlorproMAZINE, labetalol, morphine injection, ondansetron **OR** ondansetron (ZOFRAN) IV   Anti-infectives (From admission, onward)    None      Subjective: [name] today has no fevers, No chest pain,   - Nausea and vomiting persist--emesis without blood or bile --Hiccups persist   Objective: Vitals:   11/18/23 2335 11/19/23 0350 11/19/23 0633 11/19/23 1330  BP: (!) 160/82 125/75 132/72 (!) 158/66  Pulse: 74 72 69 67  Resp: 20 20 20 18   Temp: 98.3 F (36.8 C) 98.6 F (37 C) 98.4 F (36.9 C) 97.8 F (36.6 C)  TempSrc: Oral Oral Oral Oral  SpO2: 99% 96% 97% 99%  Weight: 83.6 kg     Height: 5\' 1"  (1.549 m)       Intake/Output Summary (Last 24 hours) at 11/19/2023 1740 Last data filed at 11/19/2023 1655 Gross per 24 hour  Intake 1178.26 ml  Output 1700 ml  Net -521.74 ml   Filed Weights   11/18/23 1839 11/18/23 2335  Weight: 67.1 kg 83.6 kg    Physical Exam  Gen:- Awake Alert,  in no apparent distress  HEENT:- [place].AT, No  sclera icterus Neck-Supple Neck,No JVD,.  Lungs-  CTAB , fair symmetrical air movement CV- S1, S2 normal, regular  Abd-  +ve B.Sounds, Abd Soft, No significant tenderness,    Extremity/Skin:- No  edema, pedal pulses present  Psych-affect is appropriate, oriented x3 Neuro-no new focal deficits, no tremors  Data Reviewed: I have personally reviewed following labs and imaging studies  CBC: Recent Labs  Lab 11/18/23 1858 11/19/23 0118  WBC 12.6* 13.2*  NEUTROABS 10.1*  --   HGB 16.5 15.0  HCT 49.0 44.8  MCV 84.6 85.8  PLT 211 217   Basic Metabolic Panel: Recent Labs  Lab 11/18/23 1858 11/19/23 0118  11/19/23 0854  NA 137 138 134*  K 3.5 4.1 3.6  CL 102 103 102  CO2 24 26 26   GLUCOSE 193* 177* 159*  BUN 15 16 18   CREATININE 1.00 1.05 1.06  CALCIUM 8.8* 8.2* 7.9*  MG  --  1.9  --   PHOS  --  2.5  --    GFR: Estimated Creatinine Clearance: 59.4 mL/min (by C-G formula based on SCr of 1.06 mg/dL). Liver Function Tests: Recent Labs  Lab 11/18/23 1858 11/19/23 0118  AST 20 16  ALT 25 24  ALKPHOS 62 57  BILITOT 3.1* 2.9*  PROT 7.3 6.5  ALBUMIN 4.0 3.5   HbA1C: Recent Labs    11/19/23 0118  HGBA1C 7.2*   Radiology Studies: CT ABDOMEN PELVIS W CONTRAST Result Date: 11/18/2023 CLINICAL DATA:  Acute abdominal pain EXAM: CT ABDOMEN AND PELVIS WITH CONTRAST TECHNIQUE: Multidetector CT imaging of the abdomen and pelvis was performed using the standard protocol following bolus administration of intravenous contrast. RADIATION DOSE REDUCTION: This exam was performed according to the departmental dose-optimization program which includes automated exposure control, adjustment of the mA and/or kV according to patient size and/or use of iterative reconstruction technique. CONTRAST:  OMNIPAQUE IOHEXOL 300 MG/ML  SOLN COMPARISON:  06/12/2021 FINDINGS: Lower chest: No acute abnormality. Hepatobiliary: Liver is within normal limits. Gallbladder is well distended with a single dependent density without significant calcification. This may represent a small gallstone or gallbladder polyp. Pancreas: Unremarkable. No pancreatic ductal dilatation or surrounding inflammatory changes. Spleen: Normal in size without focal abnormality. Adrenals/Urinary Tract: Adrenal glands are within normal limits. Kidneys demonstrate a normal enhancement pattern bilaterally. Simple renal cysts are seen bilaterally and stable from the prior exam. No further follow-up is recommended. No obstructive changes are seen. Bladder is well distended. Stomach/Bowel: The appendix is within normal limits. No obstructive or  inflammatory changes of the colon are seen. Stomach is well distended with ingested food stuffs and fluid. Some findings of reflux are noted in the distal esophagus. Mild esophageal thickening is noted which may represent some reflux esophagitis. Small bowel is within normal limits. Vascular/Lymphatic: Aortic atherosclerosis. No enlarged abdominal or pelvic lymph nodes. Reproductive: Prostate is unremarkable. Other: Bilateral fat containing inguinal hernias are again noted. No bowel is noted within. Prior ventral hernia repair is noted. Musculoskeletal: No acute or significant osseous findings. IMPRESSION: Dependent density within the gallbladder likely representing a polyp or poorly calcified gallstone. Changes of gastroesophageal reflux and possible reflux esophagitis. No acute abnormality noted. Electronically Signed   By: Alcide Clever M.D.   On: 11/18/2023 20:14   DG Chest Port 1 View Result Date: 11/18/2023 CLINICAL DATA:  GI bleeding and vomiting. EXAM: PORTABLE CHEST 1 VIEW COMPARISON:  Chest x-ray 05/24/2018 FINDINGS: There small bilateral pleural effusions. There is minimal bibasilar atelectasis. The heart is enlarged. There  is no pneumothorax or acute fracture. IMPRESSION: 1. Small bilateral pleural effusions. 2. Cardiomegaly. Electronically Signed   By: Darliss Cheney M.D.   On: 11/18/2023 19:39   Scheduled Meds:  amLODipine  10 mg Oral Daily   atorvastatin  20 mg Oral Daily   insulin aspart  0-15 Units Subcutaneous TID WC   pantoprazole  40 mg Oral BID   prochlorperazine  25 mg Rectal Once   Continuous Infusions:  sodium chloride 83 mL/hr at 11/19/23 0909    LOS: 0 days   Shon Hale M.D on 11/19/2023 at 5:40 PM  Go to [email or web address] - for contact info  [facility] - Office  [phone]  If 7PM-7AM, please contact night-coverage [email or web address] 11/19/2023, 5:40 PM

## 2023-11-19 NOTE — Evaluation (Signed)
Physical Therapy Evaluation Patient Details Name: [name] [name] MRN: [id number] DOB: [birth year] Today's Date: 11/19/2023  History of Present Illness  [name] [name] is a 73 y.o. male with medical history significant of T2DM, hypertension, anxiety, depression, GERD, history of TIA who presents to the emergency department due to vomiting which started this morning.  Vomitus was referred to as being black and it was associated with diffuse abdominal pain.   Clinical Impression  Patient was agreeable to therapy. Patient has HOB elevated to assist in going from supine to sit. PT gave min assist during bed mobility. Once EOB patient was able to maintain balance with support. Patient attempted to stand without walker but ended up requiring to give assist for sit to stand. Due to some weakness in LE. Once standing patient was able to take a couple side steps with the RW to transfer to the chair. PT provided some verbal/tactile cueing when patient was utilizing the RW for ambulation. Patient limited due to fatigue during session. Patient will benefit from continued skilled physical therapy in hospital and recommended venue below to increase strength, balance, endurance for safe ADLs and gait.          If plan is discharge home, recommend the following: A little help with walking and/or transfers;A little help with bathing/dressing/bathroom;Help with stairs or ramp for entrance;Assistance with cooking/housework   Can travel by private vehicle        Equipment Recommendations None recommended by PT  Recommendations for Other Services       Functional Status Assessment Patient has had a recent decline in their functional status and demonstrates the ability to make significant improvements in function in a reasonable and predictable amount of time.     Precautions / Restrictions Precautions Precautions: Fall Restrictions Weight Bearing Restrictions Per Provider Order: No      Mobility  Bed  Mobility Overal bed mobility: Needs Assistance Bed Mobility: Supine to Sit     Supine to sit: Min assist, HOB elevated       Patient Response: Cooperative  Transfers Overall transfer level: Needs assistance Equipment used: Rolling walker (2 wheels) Transfers: Sit to/from Stand, Bed to chair/wheelchair/BSC Sit to Stand: Min assist   Step pivot transfers: Min assist       General transfer comment: Needs help with initial boost, heavy reliance on AD, limited due to fatigue    Ambulation/Gait Ambulation/Gait assistance: Min assist Gait Distance (Feet): 3 Feet (side steps) Assistive device: Rolling walker (2 wheels) Gait Pattern/deviations: Step-to pattern, Decreased step length - right, Decreased step length - left, Decreased stride length Gait velocity: slow     General Gait Details: slow, labored movement, a bit unsteady  Stairs            Wheelchair Mobility     Tilt Bed Tilt Bed Patient Response: Cooperative  Modified Rankin (Stroke Patients Only)       Balance Overall balance assessment: Needs assistance Sitting-balance support: Feet supported, Bilateral upper extremity supported Sitting balance-Leahy Scale: Fair Sitting balance - Comments: fair/good   Standing balance support: Reliant on assistive device for balance, Bilateral upper extremity supported Standing balance-Leahy Scale: Poor Standing balance comment: poor/fair, RW helps to increase balance                             Pertinent Vitals/Pain Pain Assessment Pain Assessment: No/denies pain    Home Living Family/patient expects to be discharged to:: Private residence Living  Arrangements: Spouse/significant other Available Help at Discharge: Available PRN/intermittently;Family Type of Home: Apartment Home Access: Level entry       Home Layout: One level Home Equipment: Agricultural consultant (2 wheels);Shower seat;Cane - single point Additional Comments: Wife is an amputee due  to DM    Prior Function Prior Level of Function : Independent/Modified Independent                     Extremity/Trunk Assessment   Upper Extremity Assessment Upper Extremity Assessment: Generalized weakness    Lower Extremity Assessment Lower Extremity Assessment: Generalized weakness    Cervical / Trunk Assessment Cervical / Trunk Assessment: Normal  Communication   Communication Communication: No apparent difficulties Cueing Techniques: Verbal cues;Tactile cues  Cognition Arousal: Alert Behavior During Therapy: WFL for tasks assessed/performed Overall Cognitive Status: Within Functional Limits for tasks assessed                                          General Comments      Exercises     Assessment/Plan    PT Assessment Patient needs continued PT services  PT Problem List Decreased strength;Decreased activity tolerance;Decreased balance;Decreased mobility;Decreased range of motion       PT Treatment Interventions DME instruction;Gait training;Stair training;Patient/family education;Functional mobility training;Therapeutic activities;Therapeutic exercise;Balance training    PT Goals (Current goals can be found in the Care Plan section)  Acute Rehab PT Goals Patient Stated Goal: To return home PT Goal Formulation: With patient Time For Goal Achievement: 12/03/23 Potential to Achieve Goals: Good    Frequency Min 3X/week     Co-evaluation               AM-PAC PT "6 Clicks" Mobility  Outcome Measure Help needed turning from your back to your side while in a flat bed without using bedrails?: A Little Help needed moving from lying on your back to sitting on the side of a flat bed without using bedrails?: A Little Help needed moving to and from a bed to a chair (including a wheelchair)?: A Little Help needed standing up from a chair using your arms (e.g., wheelchair or bedside chair)?: A Little Help needed to walk in hospital  room?: A Lot Help needed climbing 3-5 steps with a railing? : Total 6 Click Score: 15    End of Session Equipment Utilized During Treatment: Gait belt Activity Tolerance: Patient tolerated treatment well;Patient limited by fatigue Patient left: in chair;with call bell/phone within reach Nurse Communication: Mobility status PT Visit Diagnosis: Unsteadiness on feet (R26.81);Other abnormalities of gait and mobility (R26.89);Muscle weakness (generalized) (M62.81)    Time: 1610-9604 PT Time Calculation (min) (ACUTE ONLY): 14 min   Charges:   PT Evaluation $PT Eval Low Complexity: 1 Low PT Treatments $Therapeutic Activity: 8-22 mins PT General Charges $$ ACUTE PT VISIT: 1 Visit         Meili Kleckley SPT

## 2023-11-20 ENCOUNTER — Inpatient Hospital Stay (HOSPITAL_COMMUNITY): Payer: 59

## 2023-11-20 ENCOUNTER — Other Ambulatory Visit (HOSPITAL_COMMUNITY): Payer: Self-pay | Admitting: *Deleted

## 2023-11-20 DIAGNOSIS — R112 Nausea with vomiting, unspecified: Secondary | ICD-10-CM | POA: Diagnosis not present

## 2023-11-20 DIAGNOSIS — R0609 Other forms of dyspnea: Secondary | ICD-10-CM

## 2023-11-20 LAB — ECHOCARDIOGRAM COMPLETE
AR max vel: 2.4 cm2
AV Area VTI: 2.3 cm2
AV Area mean vel: 2.26 cm2
AV Mean grad: 3.2 mm[Hg]
AV Peak grad: 5.8 mm[Hg]
Ao pk vel: 1.2 m/s
Area-P 1/2: 2.87 cm2
Height: 61 in
S' Lateral: 2.4 cm
Weight: 2948.87 [oz_av]

## 2023-11-20 LAB — GLUCOSE, CAPILLARY
Glucose-Capillary: 120 mg/dL — ABNORMAL HIGH (ref 70–99)
Glucose-Capillary: 171 mg/dL — ABNORMAL HIGH (ref 70–99)

## 2023-11-20 MED ORDER — BISACODYL 10 MG RE SUPP: 10.0000 mg | Freq: Every day | RECTAL | 0 refills | Status: AC

## 2023-11-20 MED ORDER — ASPIRIN 81 MG PO TBEC: 81.0000 mg | DELAYED_RELEASE_TABLET | Freq: Every day | ORAL | 12 refills | Status: AC

## 2023-11-20 MED ORDER — PANTOPRAZOLE SODIUM 40 MG PO TBEC: 40.0000 mg | DELAYED_RELEASE_TABLET | Freq: Every day | ORAL | 1 refills | Status: AC

## 2023-11-20 MED ORDER — ACETAMINOPHEN 325 MG PO TABS: 650.0000 mg | ORAL_TABLET | Freq: Four times a day (QID) | ORAL | Status: AC | PRN

## 2023-11-20 MED ORDER — BISACODYL 10 MG RE SUPP: 10.0000 mg | Freq: Once | RECTAL | Status: DC

## 2023-11-20 NOTE — TOC Transition Note (Signed)
Transition of Care [facility]) - Discharge Note   Patient Details  Name: [name] [name] MRN: [id number] Date of Birth: 1951-03-22  Transition [facility] Care [facility]) CM/SW Contact:  Leitha Bleak, RN Phone Number: 11/20/2023, 3:59 PM   Clinical Narrative:   Patient discharging home with [facility] HHPT. MD aware to order. Added to AVS.    Final next level of care: Home w Home Health Services Barriers to Discharge: Barriers Resolved  Patient Goals and CMS Choice Patient states their goals for this hospitalization and ongoing recovery are:: to go home. CMS Medicare.gov Compare Post Acute Care list provided to:: Patient Choice offered to / list presented to : Patient     Discharge Placement            Patient and family notified of of transfer: 11/20/23  Discharge Plan and Services Additional resources added to the After Visit Summary for       Post Acute Care Choice: Home Health                 HH Arranged: PT [facility] Agency: [facility] Date [facility] Agency Contacted: 11/19/23 Time HH Agency Contacted: 1124 Representative spoke with at [facility] Agency: Kandee Keen  Social Drivers of Health (SDOH) Interventions SDOH Screenings   Food Insecurity: No Food Insecurity (11/18/2023)  Housing: Low Risk  (11/18/2023)  Transportation Needs: No Transportation Needs (11/18/2023)  Utilities: Not At Risk (11/18/2023)  Financial Resource Strain: Medium Risk (11/03/2022)   [facility] from [facility]  Social Connections: Unknown (11/19/2023)  Tobacco Use: Low Risk  (11/18/2023)    Readmission Risk Interventions    11/19/2023   11:25 AM  Readmission Risk Prevention Plan  Post Dischage Appt Not Complete  Medication Screening Complete  Transportation Screening Complete

## 2023-11-20 NOTE — Progress Notes (Signed)
*  PRELIMINARY RESULTS* Echocardiogram 2D Echocardiogram has been performed.  Stacey Drain 11/20/2023, 3:21 PM

## 2023-11-20 NOTE — Plan of Care (Signed)

## 2023-11-20 NOTE — Discharge Summary (Incomplete)
[name] [name], is a 73 y.o. male  DOB [birth year]  MRN [id number].  Admission date:  11/18/2023  Admitting Physician  Frankey Shown, DO  Discharge Date:  11/20/2023   Primary MD  Rebekah Chesterfield, NP  Recommendations for primary care physician for things to follow:  1)Avoid ibuprofen/Advil/Aleve/Motrin/Goody Powders/Naproxen/BC powders/Meloxicam/Diclofenac/Indomethacin and other Nonsteroidal anti-inflammatory medications as these will make you more likely to bleed and can cause stomach ulcers, can also cause Kidney problems.   2)The 'BRAT' diet is suggested, then progress to diet as tolerated as symptoms abate.  -- BRAT (bananas, rice, apples, toast) -you may also consume other mild foods that ease the GI tract such as saltines, oatmeal, or boiled potatoes. Call if bloody stools, persistent diarrhea, vomiting, fever or abdominal pain.  3)Repeat CBC and BMP Blood Tests in 1 week  4) Please Note that there has been changes to your medications --Hold Metformin, Lasix as advised  Admission Diagnosis  Uncontrollable vomiting [R11.10] Epigastric pain [R10.13] Intractable nausea and vomiting [R11.2] Esophagitis [K20.90]   Discharge Diagnosis  Uncontrollable vomiting [R11.10] Epigastric pain [R10.13] Intractable nausea and vomiting [R11.2] Esophagitis [K20.90]    Principal Problem:   Intractable nausea and vomiting Active Problems:   Essential hypertension   GERD   Coronary artery disease involving native heart without angina pectoris   Abdominal pain   Esophagitis   Type 2 diabetes mellitus with hyperglycemia (HCC)   Mixed hyperlipidemia      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   Stroke [facility])     Past Surgical History:  Procedure Laterality Date   CATARACT EXTRACTION W/PHACO Right 08/14/2022   Procedure: CATARACT EXTRACTION PHACO AND INTRAOCULAR LENS PLACEMENT (IOC);  Surgeon: Fabio Pierce, MD;  Location: [facility];  Service: Ophthalmology;  Laterality: Right;  CDE 12.51   RECTAL SURGERY     As an infant   TONSILLECTOMY     UMBILICAL HERNIA REPAIR         HPI  from the history and physical done on the day of admission:    HPI: [name] [name] is a 73 y.o. male with medical history significant of T2DM, hypertension, anxiety, depression, GERD, history of TIA who presents to the emergency department due to vomiting which started this morning.  Vomitus was referred to as being black and it was associated with diffuse abdominal pain.   ED Course:  In the emergency department, BP was 170/85, but other vital signs are within normal range.  Workup in the ED showed normal CBC except for WBC.  BMP was normal except for blood glucose of 193 CT abdomen and pelvis with contrast shows  changes of gastroesophageal reflux and possible reflux esophagitis Chest x-ray showed small bilateral pleural effusions.  Cardiomegaly Pain medication, antiemetic with Phenergan and Zofran were given.  IV hydration was provided.  Hospitalist was asked to admit patient for further evaluation and management.   Review of Systems: Review of systems as noted in the HPI. All other systems reviewed and are negative.     Hospital Course:   Brief Narrative:  73 y.o. male with medical history significant of T2DM, hypertension, anxiety, depression, GERD, history of TIA admitted on 11/18/2023 with intractable emesis   -Assessment and Plan: 1)Intractable Emesis--- CT abdomen and pelvis without acute findings--reflux esophagitis noted Failed trial of oral intake and at this morning -Doing better this  evening with liquid diet -Continue as needed antiemetics -- Hiccups noted--- as needed Thorazine   2) reflux esophagitis--- CT abdomen findings noted -Protonix as prescribed -No bleeding concerns   3)Bilateral Pleural effusion/dyspnea----CT abdomen and chest x-ray with small bilateral pleural effusions and cardiomegaly -Treated with Lasix -No further dyspnea, no hypoxia  echocardiogram with preserved EF of 60 to 65%, grade 1 diastolic dysfunction noted, no aortic stenosis, no mitral stenosis   4)HTN--continue PTA amlodipine -Add metoprolol   5)H/o CAD-continue aspirin, hold Plavix in case patient needs surgery -Continue Lipitor -Add metoprolol   6)DM2--A1c 7.2 reflecting uncontrolled DM with hyperglycemia PTA -- Hold Mounjaro, hold Jardiance, and hold [place] as well as metformin Use Novolog/Humalog Sliding scale insulin with Accu-Cheks/Fingersticks as ordered      Disposition: The patient is from: Home              Anticipated d/c is to: Home    Discharge Condition: ***  Follow UP   Follow-up Information     [facility] Follow up.   Specialty: Home Health Services Why: PT will call to schedule your first home visit. Contact information: [address] [place] [address] [phone]         Rebekah Chesterfield, NP. Schedule an appointment as soon as possible for a visit in 1 week(s).   Specialty: Internal Medicine Why: Repeat CBC and BMP Blood Tests Contact information: [address] [place] [address] [phone]                    Diet and Activity recommendation:  As advised  Discharge Instructions    **** Discharge Instructions     Call MD for:  difficulty breathing, headache or visual disturbances   Complete by: As directed    Call MD for:  persistant dizziness or light-headedness   Complete by: As directed    Call MD for:  persistant nausea and vomiting   Complete by: As directed    Call MD for:   temperature >100.4   Complete by: As directed    Diet - low sodium heart healthy   Complete by: As directed    Diet Carb Modified   Complete by: As directed    Discharge instructions   Complete by: As directed    1)Avoid ibuprofen/Advil/Aleve/Motrin/Goody Powders/Naproxen/BC powders/Meloxicam/Diclofenac/Indomethacin and other Nonsteroidal anti-inflammatory medications as these will make you more likely to bleed and can cause stomach ulcers, can also cause Kidney problems.   2)The 'BRAT' diet is suggested, then progress to diet as tolerated as symptoms abate.  -- BRAT (bananas, rice, apples, toast) -you may also consume other mild foods that ease the GI tract such as saltines, oatmeal, or boiled potatoes. Call if bloody stools,  persistent diarrhea, vomiting, fever or abdominal pain.  3)Repeat CBC and BMP Blood Tests in 1 week  4) Please Note that there has been changes to your medications --Hold Metformin, Lasix as advised   Face-to-face encounter (required for Medicare/Medicaid patients)   Complete by: As directed    I Lorelie Biermann certify that this patient is under my care and that I, or a nurse practitioner or physician's assistant working with me, had a face-to-face encounter that meets the physician face-to-face encounter requirements with this patient on 11/20/2023. The encounter with the patient was in whole, or in part for the following medical condition(s) which is the primary reason for home health care (List medical condition):  - Generalized weakness and deconditioning   The encounter with the patient was in whole, or in part, for the following medical condition, which is the primary reason for home health care: Generalized weakness and deconditioning   I certify that, based on my findings, the following services are medically necessary home health services: Physical therapy   Reason for Medically Necessary Home Health Services: Skilled Nursing- Change/Decline in Patient Status    My clinical findings support the need for the above services: Unsafe ambulation due to balance issues   Further, I certify that my clinical findings support that this patient is homebound due to: Unsafe ambulation due to balance issues   Home Health   Complete by: As directed    Generalized weakness and deconditioning   To provide the following care/treatments: PT   Increase activity slowly   Complete by: As directed          Discharge Medications     Allergies as of 11/20/2023   No Known Allergies      Medication List     PAUSE taking these medications    furosemide 20 MG tablet Wait to take this until: November 23, 2023 Commonly known as: LASIX TAKE ONE TABLET BY MOUTH IN THE MORNING FOR BLOOD PRESSURE AND FLUID (MORNING)   metFORMIN 1000 MG tablet Wait to take this until: November 22, 2023 Commonly known as: GLUCOPHAGE TAKE ONE TABLET BY MOUTH TWICE DAILY WITH A MEAL. (MORNING ,EVENING)       STOP taking these medications    aspirin 81 MG chewable tablet Replaced by: aspirin EC 81 MG tablet   HYDROcodone-acetaminophen 5-325 MG tablet Commonly known as: NORCO/VICODIN   HYDROcodone-acetaminophen 7.5-325 MG tablet Commonly known as: NORCO       TAKE these medications    acetaminophen 325 MG tablet Commonly known as: TYLENOL Take 2 tablets (650 mg total) by mouth every 6 (six) hours as needed for mild pain (pain score 1-3) (or Fever >/= 101).   amLODipine 10 MG tablet Commonly known as: NORVASC 1 tablet Orally daily for 90 days   aspirin EC 81 MG tablet Take 1 tablet (81 mg total) by mouth daily with breakfast. Swallow whole. Start taking on: November 21, 2023 Replaces: aspirin 81 MG chewable tablet   atorvastatin 20 MG tablet Commonly known as: LIPITOR Take 1 tablet (20 mg total) by mouth daily.   bisacodyl 10 MG suppository Commonly known as: DULCOLAX Place 1 suppository (10 mg total) rectally at bedtime for 3 doses.   clopidogrel 75 MG  tablet Commonly known as: PLAVIX Take 1 tablet (75 mg total) by mouth daily.   Jardiance 25 MG Tabs tablet Generic drug: empagliflozin Take 25 mg by mouth daily.   Mounjaro 2.5 MG/0.5ML Pen Generic drug: tirzepatide Inject 2.5 mg into the  skin once a week.   nitroGLYCERIN 0.4 MG SL tablet Commonly known as: NITROSTAT Place 0.4 mg under the tongue every 5 (five) minutes as needed for chest pain.   pantoprazole 40 MG tablet Commonly known as: PROTONIX Take 1 tablet (40 mg total) by mouth daily.   potassium chloride 10 MEQ tablet Commonly known as: KLOR-CON M TAKE ONE TABLET BY MOUTH TWICE DAILY (MORNING ,EVENING) What changed: See the new instructions.   Evaristo Bury FlexTouch 100 UNIT/ML FlexTouch Pen Generic drug: insulin degludec SMARTSIG:20 Unit(s) SUB-Q Daily        Major procedures and Radiology Reports - PLEASE review detailed and final reports for all details, in brief -   ***  ECHOCARDIOGRAM COMPLETE Result Date: 11/20/2023    ECHOCARDIOGRAM REPORT   Patient Name:   [name] [name] Date of Exam: 11/20/2023 Medical Rec #:  [id number]       Height:       61.0 in Accession #:    [id number]      Weight:       184.3 lb Date of Birth:  08/24/1951       BSA:          1.824 m Patient Age:    72 years        BP:           145/70 mmHg Patient Gender: M               HR:           62 bpm. [facility] Procedure: 2D Echo, Cardiac Doppler and Color Doppler Indications:    Dyspnea R06.00  History:        Patient has prior history of Echocardiogram examinations, most                 recent 12/02/2014. CAD, TIA; Risk Factors:Hypertension, Diabetes                 and Dyslipidemia.  Sonographer:    Celesta Gentile RCS Referring Phys: [phone] Irene Mitcham IMPRESSIONS  1. Left ventricular ejection fraction, by estimation, is 60 to 65%. The left ventricle has normal function. The left ventricle has no regional wall motion abnormalities. Left ventricular diastolic parameters are  consistent with Grade I diastolic dysfunction (impaired relaxation).  2. Right ventricular systolic function is normal. The right ventricular size is normal. Tricuspid regurgitation signal is inadequate for assessing PA pressure.  3. The mitral valve is normal in structure. No evidence of mitral valve regurgitation. No evidence of mitral stenosis.  4. The aortic valve was not well visualized. There is mild calcification of the aortic valve. There is mild thickening of the aortic valve. Aortic valve regurgitation is not visualized. No aortic stenosis is present.  5. The inferior vena cava is normal in size with greater than 50% respiratory variability, suggesting right atrial pressure of 3 mmHg. FINDINGS  Left Ventricle: Left ventricular ejection fraction, by estimation, is 60 to 65%. The left ventricle has normal function. The left ventricle has no regional wall motion abnormalities. The left ventricular internal cavity size was normal in size. There is  no left ventricular hypertrophy. Left ventricular diastolic parameters are consistent with Grade I diastolic dysfunction (impaired relaxation). Normal left ventricular filling pressure. Right Ventricle: The right ventricular size is normal. Right vetricular wall thickness was not well visualized. Right ventricular systolic function is normal. Tricuspid regurgitation signal is inadequate for assessing PA pressure. Left Atrium: Left atrial size was normal  in size. Right Atrium: Right atrial size was normal in size. Pericardium: There is no evidence of pericardial effusion. Mitral Valve: The mitral valve is normal in structure. No evidence of mitral valve regurgitation. No evidence of mitral valve stenosis. Tricuspid Valve: The tricuspid valve is normal in structure. Tricuspid valve regurgitation is not demonstrated. No evidence of tricuspid stenosis. Aortic Valve: The aortic valve was not well visualized. There is mild calcification of the aortic valve. There is mild  thickening of the aortic valve. There is mild aortic valve annular calcification. Aortic valve regurgitation is not visualized. No aortic stenosis is present. Aortic valve mean gradient measures 3.2 mmHg. Aortic valve peak gradient measures 5.8 mmHg. Aortic valve area, by VTI measures 2.30 cm. Pulmonic Valve: The pulmonic valve was not well visualized. Pulmonic valve regurgitation is not visualized. No evidence of pulmonic stenosis. Aorta: The aortic root and ascending aorta are structurally normal, with no evidence of dilitation. Venous: The inferior vena cava is normal in size with greater than 50% respiratory variability, suggesting right atrial pressure of 3 mmHg. IAS/Shunts: No atrial level shunt detected by color flow Doppler.  LEFT VENTRICLE PLAX 2D LVIDd:         4.00 cm   Diastology LVIDs:         2.40 cm   LV e' medial:    5.44 cm/s LV PW:         0.90 cm   LV E/e' medial:  14.1 LV IVS:        1.10 cm   LV e' lateral:   9.68 cm/s LVOT diam:     2.00 cm   LV E/e' lateral: 7.9 LV SV:         58 LV SV Index:   32 LVOT Area:     3.14 cm  RIGHT VENTRICLE RV S prime:     10.10 cm/s TAPSE (M-mode): 1.9 cm LEFT ATRIUM             Index        RIGHT ATRIUM           Index LA diam:        3.00 cm 1.64 cm/m   RA Area:     14.70 cm LA Vol (A2C):   39.7 ml 21.76 ml/m  RA Volume:   38.30 ml  20.99 ml/m LA Vol (A4C):   36.9 ml 20.23 ml/m LA Biplane Vol: 38.9 ml 21.32 ml/m  AORTIC VALVE AV Area (Vmax):    2.40 cm AV Area (Vmean):   2.26 cm AV Area (VTI):     2.30 cm AV Vmax:           120.41 cm/s AV Vmean:          84.936 cm/s AV VTI:            0.254 m AV Peak Grad:      5.8 mmHg AV Mean Grad:      3.2 mmHg LVOT Vmax:         92.00 cm/s LVOT Vmean:        61.100 cm/s LVOT VTI:          0.186 m LVOT/AV VTI ratio: 0.73  AORTA Ao Root diam: 3.40 cm Ao Asc diam:  3.40 cm MITRAL VALVE MV Area (PHT): 2.87 cm    SHUNTS MV Decel Time: 264 msec    Systemic VTI:  0.19 m MV E velocity: 76.70 cm/s  Systemic Diam: 2.00 cm  MV A velocity:  98.50 cm/s MV E/A ratio:  0.78 Dina Rich MD Electronically signed by Dina Rich MD Signature Date/Time: 11/20/2023/3:40:36 PM    Final    CT ABDOMEN PELVIS W CONTRAST Result Date: 11/18/2023 CLINICAL DATA:  Acute abdominal pain EXAM: CT ABDOMEN AND PELVIS WITH CONTRAST TECHNIQUE: Multidetector CT imaging of the abdomen and pelvis was performed using the standard protocol following bolus administration of intravenous contrast. RADIATION DOSE REDUCTION: This exam was performed according to the departmental dose-optimization program which includes automated exposure control, adjustment of the mA and/or kV according to patient size and/or use of iterative reconstruction technique. CONTRAST:  OMNIPAQUE IOHEXOL 300 MG/ML  SOLN COMPARISON:  06/12/2021 FINDINGS: Lower chest: No acute abnormality. Hepatobiliary: Liver is within normal limits. Gallbladder is well distended with a single dependent density without significant calcification. This may represent a small gallstone or gallbladder polyp. Pancreas: Unremarkable. No pancreatic ductal dilatation or surrounding inflammatory changes. Spleen: Normal in size without focal abnormality. Adrenals/Urinary Tract: Adrenal glands are within normal limits. Kidneys demonstrate a normal enhancement pattern bilaterally. Simple renal cysts are seen bilaterally and stable from the prior exam. No further follow-up is recommended. No obstructive changes are seen. Bladder is well distended. Stomach/Bowel: The appendix is within normal limits. No obstructive or inflammatory changes of the colon are seen. Stomach is well distended with ingested food stuffs and fluid. Some findings of reflux are noted in the distal esophagus. Mild esophageal thickening is noted which may represent some reflux esophagitis. Small bowel is within normal limits. Vascular/Lymphatic: Aortic atherosclerosis. No enlarged abdominal or pelvic lymph nodes. Reproductive: Prostate is  unremarkable. Other: Bilateral fat containing inguinal hernias are again noted. No bowel is noted within. Prior ventral hernia repair is noted. Musculoskeletal: No acute or significant osseous findings. IMPRESSION: Dependent density within the gallbladder likely representing a polyp or poorly calcified gallstone. Changes of gastroesophageal reflux and possible reflux esophagitis. No acute abnormality noted. Electronically Signed   By: Alcide Clever M.D.   On: 11/18/2023 20:14   DG Chest Port 1 View Result Date: 11/18/2023 CLINICAL DATA:  GI bleeding and vomiting. EXAM: PORTABLE CHEST 1 VIEW COMPARISON:  Chest x-ray 05/24/2018 FINDINGS: There small bilateral pleural effusions. There is minimal bibasilar atelectasis. The heart is enlarged. There is no pneumothorax or acute fracture. IMPRESSION: 1. Small bilateral pleural effusions. 2. Cardiomegaly. Electronically Signed   By: Darliss Cheney M.D.   On: 11/18/2023 19:39    Micro Results   *** No results found for this or any previous visit (from the past 240 hours).  Today   Subjective    Asah Lamay today has no ***          Patient has been seen and examined prior to discharge   Objective   Blood pressure 119/63, pulse 65, temperature 98 F (36.7 C), temperature source Oral, resp. rate 18, height 5\' 1"  (1.549 m), weight 83.6 kg, SpO2 98%.   Intake/Output Summary (Last 24 hours) at 11/20/2023 1607 Last data filed at 11/20/2023 1330 Gross per 24 hour  Intake 1142.92 ml  Output 2100 ml  Net -957.08 ml    Exam Gen:- Awake Alert, no acute distress *** HEENT:- [place].AT, No sclera icterus Neck-Supple Neck,No JVD,.  Lungs-  CTAB , good air movement bilaterally CV- S1, S2 normal, regular Abd-  +ve B.Sounds, Abd Soft, No tenderness,    Extremity/Skin:- No  edema,   good pulses Psych-affect is appropriate, oriented x3 Neuro-no new focal deficits, no tremors ***   Data Review  CBC w Diff:  Lab Results  Component Value Date   WBC 13.2  (H) 11/19/2023   HGB 15.0 11/19/2023   HCT 44.8 11/19/2023   HCT 46.3 05/03/2015   PLT 217 11/19/2023   LYMPHOPCT 12 11/18/2023   MONOPCT 6 11/18/2023   EOSPCT 1 11/18/2023   BASOPCT 1 11/18/2023    CMP:  Lab Results  Component Value Date   NA 134 (L) 11/19/2023   NA 141 12/22/2016   K 3.6 11/19/2023   CL 102 11/19/2023   CO2 26 11/19/2023   BUN 18 11/19/2023   BUN 10 12/22/2016   CREATININE 1.06 11/19/2023   PROT 6.5 11/19/2023   PROT 7.0 12/22/2016   ALBUMIN 3.5 11/19/2023   ALBUMIN 4.5 12/22/2016   BILITOT 2.9 (H) 11/19/2023   BILITOT 1.7 (H) 12/22/2016   ALKPHOS 57 11/19/2023   AST 16 11/19/2023   ALT 24 11/19/2023  .  Total Discharge time is about 33 minutes  Shon Hale M.D on 11/20/2023 at 4:07 PM  Go to [email or web address] -  for contact info  [facility] - Office  [phone]

## 2023-11-20 NOTE — Discharge Instructions (Signed)
1)Avoid ibuprofen/Advil/Aleve/Motrin/Goody Powders/Naproxen/BC powders/Meloxicam/Diclofenac/Indomethacin and other Nonsteroidal anti-inflammatory medications as these will make you more likely to bleed and can cause stomach ulcers, can also cause Kidney problems.   2)The 'BRAT' diet is suggested, then progress to diet as tolerated as symptoms abate.  -- BRAT (bananas, rice, apples, toast) -you may also consume other mild foods that ease the GI tract such as saltines, oatmeal, or boiled potatoes. Call if bloody stools, persistent diarrhea, vomiting, fever or abdominal pain.  3)Repeat CBC and BMP Blood Tests in 1 week  4) Please Note that there has been changes to your medications --Hold Metformin, Lasix as advised

## 2023-12-29 NOTE — Progress Notes (Deleted)
 Referring Provider: Rebekah Chesterfield, NP  Primary Care Physician:  Rebekah Chesterfield, NP Primary Gastroenterologist:  Dr. Jena Gauss  No chief complaint on file.   HPI:   [name] [name] is a 73 y.o. male presenting today at the request of Rebekah Chesterfield, NP  for constipation and need for colonoscopy.    Reviewed referral information.  Patient with constipation.  He was prescribed Amitiza 24 mcg twice daily and referred to GI for further management and for colonoscopy.  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   Stroke [facility])     Past Surgical History:  Procedure Laterality Date   CATARACT EXTRACTION W/PHACO Right 08/14/2022   Procedure: CATARACT EXTRACTION PHACO AND INTRAOCULAR LENS PLACEMENT (IOC);  Surgeon: Fabio Pierce, MD;  Location: [facility];  Service: Ophthalmology;  Laterality: Right;  CDE 12.51   RECTAL SURGERY     As an infant   TONSILLECTOMY     UMBILICAL HERNIA REPAIR      Current Outpatient Medications  Medication Sig Dispense Refill   acetaminophen (TYLENOL) 325 MG tablet Take 2 tablets (650 mg total) by mouth every 6 (six) hours as needed for mild pain (pain score 1-3) (or Fever >/= 101).     amLODipine (NORVASC) 10 MG tablet 1 tablet Orally daily for 90 days     aspirin EC 81 MG tablet Take 1 tablet (81 mg total) by mouth daily with breakfast. Swallow whole. 30 tablet 12   atorvastatin (LIPITOR) 20 MG tablet Take 1 tablet (20 mg total) by mouth daily. 30 tablet 1   clopidogrel (PLAVIX) 75 MG tablet Take 1 tablet (75 mg total) by mouth daily. 30 tablet 1   furosemide (LASIX) 20 MG tablet  TAKE ONE TABLET BY MOUTH IN THE MORNING FOR BLOOD PRESSURE AND FLUID (MORNING) 30 tablet 5   JARDIANCE 25 MG TABS tablet Take 25 mg by mouth daily.     metFORMIN (GLUCOPHAGE) 1000 MG tablet TAKE ONE TABLET BY MOUTH TWICE DAILY WITH A MEAL. (MORNING ,EVENING) (Patient taking differently: Take 1,000 mg by mouth 2 (two) times daily with a meal.) 180 tablet 0   MOUNJARO 2.5 MG/0.5ML Pen Inject 2.5 mg into the skin once a week.     nitroGLYCERIN (NITROSTAT) 0.4 MG SL tablet Place 0.4 mg under the tongue every 5 (five) minutes as needed for chest pain.     pantoprazole (PROTONIX) 40 MG tablet Take 1 tablet (40 mg total) by mouth daily. 30 tablet 1   potassium chloride (K-DUR,KLOR-CON) 10 MEQ tablet TAKE ONE TABLET BY MOUTH TWICE DAILY (MORNING ,EVENING) (Patient taking differently: Take 10 mEq by mouth 2 (two) times daily.) 60 tablet 5   TRESIBA FLEXTOUCH 100 UNIT/ML FlexTouch Pen SMARTSIG:20 Unit(s) SUB-Q Daily     No current facility-administered medications for this visit.    Allergies as of 12/31/2023   (No Known Allergies)    Family History  Problem Relation Age of Onset   Sudden death Sister    Dementia Other    Alzheimer's disease Other  Diabetes Other    Coronary artery disease Other    Heart attack Other    Cancer Other    Lung cancer Other    Cancer Mother        lung   Diabetes Mother    Cancer Father    Diabetes Father    Colon cancer Neg Hx     Social History   Socioeconomic History   Marital status: Married    Spouse name: Not on file   Number of children: 0   Years of education: Not on file   Highest education level: Not on file  Occupational History   Occupation: Disabled  Tobacco Use   Smoking status: Never   Smokeless tobacco: Never  Vaping Use   Vaping status: Never Used  Substance and Sexual Activity   Alcohol use: Not Currently    Comment: rarely   Drug use: No   Sexual activity: Never  Other Topics Concern   Not on file  Social History  Narrative   Lives at home with his wife.   Right-handed.   2 cups caffeine coffee per day and a few sodas.   Has been on disability all his life due to significant decrease in vision and bowel problems         Social Drivers of Health   Financial Resource Strain: Medium Risk (11/03/2022)   Received from [facility], [facility]   Overall Financial Resource Strain (CARDIA)    Difficulty of Paying Living Expenses: Somewhat hard  Food Insecurity: No Food Insecurity (11/18/2023)   Hunger Vital Sign    Worried About Running Out of Food in the Last Year: Never true    Ran Out of Food in the Last Year: Never true  Transportation Needs: No Transportation Needs (11/18/2023)   PRAPARE - Administrator, Civil Service (Medical): No    Lack of Transportation (Non-Medical): No  Physical Activity: Not on file  Stress: Not on file  Social Connections: Unknown (11/19/2023)   Social Connection and Isolation Panel [NHANES]    Frequency of Communication with Friends and Family: More than three times a week    Frequency of Social Gatherings with Friends and Family: More than three times a week    Attends Religious Services: Patient declined    Database administrator or Organizations: Patient declined    Attends Banker Meetings: Patient declined    Marital Status: Patient declined  Intimate Partner Violence: Not At Risk (11/18/2023)   Humiliation, Afraid, Rape, and Kick questionnaire    Fear of Current or Ex-Partner: No    Emotionally Abused: No    Physically Abused: No    Sexually Abused: No    Review of Systems: Gen: Denies any fever, chills, fatigue, weight loss, lack of appetite.  CV: Denies chest pain, heart palpitations, peripheral edema, syncope.  Resp: Denies shortness of breath at rest or with exertion. Denies wheezing or cough.  GI: Denies dysphagia or odynophagia. Denies jaundice, hematemesis, fecal incontinence. GU : Denies urinary burning, urinary  frequency, urinary hesitancy MS: Denies joint pain, muscle weakness, cramps, or limitation of movement.  Derm: Denies rash, itching, dry skin Psych: Denies depression, anxiety, memory loss, and confusion Heme: Denies bruising, bleeding, and enlarged lymph nodes.  Physical Exam: There were no vitals taken for this visit. General:   Alert and oriented. Pleasant and cooperative. Well-nourished and well-developed.  Head:  Normocephalic and atraumatic. Eyes:  Without icterus, sclera clear and conjunctiva  pink.  Ears:  Normal auditory acuity. Lungs:  Clear to auscultation bilaterally. No wheezes, rales, or rhonchi. No distress.  Heart:  S1, S2 present without murmurs appreciated.  Abdomen:  +BS, soft, non-tender and non-distended. No HSM noted. No guarding or rebound. No masses appreciated.  Rectal:  Deferred  Msk:  Symmetrical without gross deformities. Normal posture. Extremities:  Without edema. Neurologic:  Alert and  oriented x4;  grossly normal neurologically. Skin:  Intact without significant lesions or rashes. Psych:  Alert and cooperative. Normal mood and affect.    Assessment:     Plan:  ***   Ermalinda Memos, PA-C [facility] 12/31/2023

## 2023-12-30 ENCOUNTER — Emergency Department (HOSPITAL_COMMUNITY)
Admission: EM | Admit: 2023-12-30 | Discharge: 2023-12-30 | Discharge status: Against Medical Advice | Attending: Emergency Medicine | Admitting: Emergency Medicine

## 2023-12-30 ENCOUNTER — Encounter (HOSPITAL_COMMUNITY): Payer: Self-pay

## 2023-12-30 DIAGNOSIS — Z5321 Procedure and treatment not carried out due to patient leaving prior to being seen by health care provider: Secondary | ICD-10-CM | POA: Diagnosis not present

## 2023-12-30 DIAGNOSIS — R55 Syncope and collapse: Secondary | ICD-10-CM | POA: Insufficient documentation

## 2023-12-30 LAB — BASIC METABOLIC PANEL
Anion gap: 9 (ref 5–15)
BUN: 13 mg/dL (ref 8–23)
CO2: 25 mmol/L (ref 22–32)
Calcium: 9 mg/dL (ref 8.9–10.3)
Chloride: 104 mmol/L (ref 98–111)
Creatinine, Ser: 1.14 mg/dL (ref 0.61–1.24)
GFR, Estimated: >60 mL/min (ref >60)
Glucose, Bld: 182 mg/dL — ABNORMAL HIGH (ref 70–99)
Potassium: 3.9 mmol/L (ref 3.5–5.1)
Sodium: 138 mmol/L (ref 135–145)

## 2023-12-30 LAB — CBC
HCT: 46.2 % (ref 39.0–52.0)
Hemoglobin: 14.9 g/dL (ref 13.0–17.0)
MCH: 28 pg (ref 26.0–34.0)
MCHC: 32.3 g/dL (ref 30.0–36.0)
MCV: 86.8 fL (ref 80.0–100.0)
Platelets: 197 10*3/uL (ref 150–400)
RBC: 5.32 MIL/uL (ref 4.22–5.81)
RDW: 14.1 % (ref 11.5–15.5)
WBC: 7.1 10*3/uL (ref 4.0–10.5)
nRBC: 0 % (ref 0.0–0.2)

## 2023-12-30 LAB — CBG MONITORING, ED
Glucose-Capillary: 165 mg/dL — ABNORMAL HIGH (ref 70–99)
Glucose-Capillary: 175 mg/dL — ABNORMAL HIGH (ref 70–99)

## 2023-12-30 LAB — TROPONIN I (HIGH SENSITIVITY): Troponin I (High Sensitivity): 8 ng/L (ref <18)

## 2023-12-30 NOTE — ED Notes (Signed)
 Pt on  2H with wife  and decided to no longer continue care in the ED. Triage Nurse notified.

## 2023-12-30 NOTE — ED Provider Triage Note (Signed)
 Emergency Medicine Provider Triage Evaluation Note  [name] [name] , a 73 y.o. male  was evaluated in triage.  Pt complains of syncope.  Patient states that he was upstairs in his wife's hospital room when he passed out.  Apparently doctors came in and told him that she was brain dead and that caused him to feel dizzy and then lose consciousness.  His family members at bedside caught him before he fell.  Denies any complaints or concerns at this time.  Review of Systems  Positive: Syncope  Negative: Head injury  Physical Exam  BP 139/79 (BP Location: Right Arm)   Pulse 64   Temp 98.2 F (36.8 C) (Oral)   Resp 18   Ht 5\' 1"  (1.549 m)   SpO2 100%   BMI 34.82 kg/m  Gen:   Awake, no distress   Resp:  Normal effort  MSK:   Moves extremities without difficulty  Other:    Medical Decision Making  Medically screening exam initiated at 4:27 PM.  Appropriate orders placed.  [name] [name] was informed that the remainder of the evaluation will be completed by another provider, this initial triage assessment does not replace that evaluation, and the importance of remaining in the ED until their evaluation is complete.  Patient's family member at bedside in the ED got a call from a nurse upstairs that they took the wife off of life support and she was decompensating fast. Tech helped bring patient and family back upstairs to see wife.  May or may not come back to the ED after.   [name] Marion, PA-C 12/30/23 [phone]

## 2023-12-30 NOTE — ED Triage Notes (Signed)
 Pt presents after a near syncopal episode this afternoon.  Pt's wife is admitted upstairs and placed on comfort care this afternoon.  Also, the Pt has not eaten breakfast.  Pt reports he hasn't slept well x3 days.

## 2023-12-31 ENCOUNTER — Ambulatory Visit: Payer: 59 | Attending: Internal Medicine

## 2023-12-31 ENCOUNTER — Ambulatory Visit: Admitting: Gastroenterology

## 2023-12-31 ENCOUNTER — Encounter: Payer: Self-pay | Admitting: Gastroenterology

## 2024-01-14 ENCOUNTER — Ambulatory Visit

## 2024-01-21 ENCOUNTER — Ambulatory Visit: Attending: Internal Medicine | Admitting: Physical Therapy

## 2024-03-06 DIAGNOSIS — E785 Hyperlipidemia, unspecified: Secondary | ICD-10-CM | POA: Insufficient documentation

## 2024-03-06 NOTE — Progress Notes (Unsigned)
 Cardiology Office Note:   Date:  03/07/2024  ID:  [name] [name], DOB 06-Jun-1951, MRN [id number] PCP: Lenn Quint, NP  [facility] Providers Cardiologist:  Eilleen Grates, MD {  History of Present Illness:   [name] [name] is a 73 y.o. male who presents for evaluation of chest pain.  He was in the ED in Jan 2024.  This was at [facility].  He had an echocardiogram in December 2023 that demonstrated a well-preserved ejection fraction.  He was hospitalized from February of 2023 at  [facility] with a negative stress test..  When I saw him in 2020 for I sent him for CT and he had non obstructive plaque.  The LAD had 50 - 69% stenosis.     Since I last saw him he has had no new cardiovascular problems.  He exercises routinely. The patient denies any new symptoms such as chest discomfort, neck or arm discomfort. There has been no new shortness of breath, PND or orthopnea. There have been no reported palpitations, presyncope or syncope.  His biggest issue is for the past week he has had intractable hiccups.  This is happening every few seconds in the office.  He has had no diarrhea constipation or other abdominal complaints.  Has had no new shortness of breath, PND or orthopnea.  Has had no palpitations, presyncope or syncope.  He has not seen a primary care about this or had any workup.  ROS: Hiccups x 1 week.  Otherwise as stated in the HPI and negative for all other systems.  Studies Reviewed:    EKG:   NA  Risk Assessment/Calculations:           Physical Exam:   VS:  BP (!) 164/80 (BP Location: Right Arm, Cuff Size: Normal)   Pulse 94   Ht 5\' 1"  (1.549 m)   Wt 178 lb (80.7 kg)   SpO2 95%   BMI 33.63 kg/m    Wt Readings from Last 3 Encounters:  03/07/24 178 lb (80.7 kg)  11/18/23 184 lb 4.9 oz (83.6 kg)  02/14/23 185 lb (83.9 kg)     GEN: Well nourished, well developed in no acute distress NECK: No JVD; No carotid bruits CARDIAC: RRR, no murmurs, rubs,  gallops RESPIRATORY:  Clear to auscultation without rales, wheezing or rhonchi  ABDOMEN: Soft, non-tender, non-distended EXTREMITIES:  No edema; No deformity   ASSESSMENT AND PLAN:   Precordial chest pain:  He had non obstructive CAD.  He has had no further chest discomfort.  He has been exercising routinely including walking so no further testing is indicated.  He needs improved risk reduction.  Type II DM: Patient is A1c was 7.0 which is down from previous.  Continue current meds.   Dyslipidemia: His LDL was 80.  I think the goal LDL should be 50.  Since he is having the hiccups I am going to check a liver profile and repeat a lipid.  The last 1 was in December.    HTN: His blood pressure is elevated but he thinks it is because of his intractable hiccups.  He is any get these treated as below and then do a 10-day blood pressure diary.  He might need to have the addition of an ACE inhibitor for better blood pressure control.  Hiccups: I suggested that he go to the emergency room.  He said he has had this problem in the distant past when he got an injection.  This may  have been an off label use chlorpromazine  Haldol or other.  I have suggested he go to the emergency room as this is frequent and now somewhat debilitating.  He is doing it constantly while in the office.  He has no other GI symptoms but might need further imaging.  Could also consider using the over-the-counter device HiccAway.      Follow up with me in one year.   Signed, Eilleen Grates, MD

## 2024-03-07 ENCOUNTER — Ambulatory Visit (INDEPENDENT_AMBULATORY_CARE_PROVIDER_SITE_OTHER): Admitting: Cardiology

## 2024-03-07 ENCOUNTER — Encounter: Payer: Self-pay | Admitting: Cardiology

## 2024-03-07 VITALS — BP 164/80 | HR 94 | Ht 61.0 in | Wt 178.0 lb

## 2024-03-07 DIAGNOSIS — E785 Hyperlipidemia, unspecified: Secondary | ICD-10-CM

## 2024-03-07 DIAGNOSIS — R072 Precordial pain: Secondary | ICD-10-CM

## 2024-03-07 DIAGNOSIS — Z79899 Other long term (current) drug therapy: Secondary | ICD-10-CM | POA: Diagnosis not present

## 2024-03-07 DIAGNOSIS — E118 Type 2 diabetes mellitus with unspecified complications: Secondary | ICD-10-CM | POA: Diagnosis not present

## 2024-03-07 NOTE — Patient Instructions (Addendum)
 Medication Instructions:  Your physician recommends that you continue on your current medications as directed. Please refer to the Current Medication list given to you today.  Labwork: Your physician recommends that you return for a FASTING lipid & liver profile as soon as possible. Please do not eat or drink for at least 8 hours when you have this done. You may take your medications that morning with a sip of water . [employer] ([address] [place])  Testing/Procedures: none  Follow-Up: Your physician recommends that you schedule a follow-up appointment in: 18 months. You will receive a reminder call in about 12-14 months reminding you to schedule your appointment. If you don't receive this call, please contact our office.  Any Other Special Instructions Will Be Listed Below (If Applicable). Your physician has requested that you regularly monitor and record your blood pressure readings at home twice daily for 10 days. Please use the same arm &  machine at the same time of day to check your readings and record them. Please bring these readings to our office.  You can purchase Hiccaway at your local pharmacy.  If you need a refill on your cardiac medications before your next appointment, please call your pharmacy.

## 2024-04-02 LAB — HEPATIC FUNCTION PANEL
ALT: 23 IU/L (ref 0–44)
AST: 19 IU/L (ref 0–40)
Albumin: 4.5 g/dL (ref 3.9–4.9)
Alkaline Phosphatase: 92 IU/L (ref 44–121)
Bilirubin Total: 1.5 mg/dL — ABNORMAL HIGH (ref 0.0–1.2)
Bilirubin, Direct: 0.41 mg/dL — ABNORMAL HIGH (ref 0.00–0.40)
Total Protein: 7.4 g/dL (ref 6.0–8.5)

## 2024-04-02 LAB — LIPID PANEL
Chol/HDL Ratio: 3.2 ratio (ref 0.0–5.0)
Cholesterol, Total: 106 mg/dL (ref 100–199)
HDL: 33 mg/dL — ABNORMAL LOW
LDL Chol Calc (NIH): 56 mg/dL (ref 0–99)
Triglycerides: 88 mg/dL (ref 0–149)
VLDL Cholesterol Cal: 17 mg/dL (ref 5–40)

## 2024-04-03 ENCOUNTER — Ambulatory Visit: Payer: Self-pay | Admitting: Cardiology
